# Patient Record
Sex: Male | Born: 1940
Health system: Southern US, Community
[De-identification: ages and names within clinical notes are randomized; demographics above are authoritative.]

## PROBLEM LIST (undated history)

## (undated) DIAGNOSIS — J449 Chronic obstructive pulmonary disease, unspecified: Secondary | ICD-10-CM

## (undated) DIAGNOSIS — F329 Major depressive disorder, single episode, unspecified: Secondary | ICD-10-CM

## (undated) DIAGNOSIS — J984 Other disorders of lung: Secondary | ICD-10-CM

## (undated) DIAGNOSIS — K224 Dyskinesia of esophagus: Secondary | ICD-10-CM

## (undated) DIAGNOSIS — I10 Essential (primary) hypertension: Secondary | ICD-10-CM

## (undated) DIAGNOSIS — E7800 Pure hypercholesterolemia, unspecified: Secondary | ICD-10-CM

## (undated) DIAGNOSIS — J189 Pneumonia, unspecified organism: Secondary | ICD-10-CM

## (undated) DIAGNOSIS — F32A Depression, unspecified: Secondary | ICD-10-CM

## (undated) DIAGNOSIS — K449 Diaphragmatic hernia without obstruction or gangrene: Secondary | ICD-10-CM

## (undated) DIAGNOSIS — M1990 Unspecified osteoarthritis, unspecified site: Secondary | ICD-10-CM

## (undated) DIAGNOSIS — K228 Other specified diseases of esophagus: Secondary | ICD-10-CM

## (undated) HISTORY — PX: ABDOMINAL HERNIA REPAIR: SHX539

## (undated) HISTORY — PX: BACK SURGERY: SHX140

## (undated) HISTORY — PX: JOINT REPLACEMENT: SHX530

---

## 2002-08-20 ENCOUNTER — Emergency Department (HOSPITAL_COMMUNITY): Admission: EM | Admit: 2002-08-20 | Discharge: 2002-08-20 | Payer: Self-pay | Admitting: Emergency Medicine

## 2002-08-21 ENCOUNTER — Emergency Department (HOSPITAL_COMMUNITY): Admission: EM | Admit: 2002-08-21 | Discharge: 2002-08-22 | Payer: Self-pay | Admitting: Emergency Medicine

## 2002-08-22 ENCOUNTER — Emergency Department (HOSPITAL_COMMUNITY): Admission: EM | Admit: 2002-08-22 | Discharge: 2002-08-22 | Payer: Self-pay | Admitting: Emergency Medicine

## 2002-08-22 ENCOUNTER — Encounter: Payer: Self-pay | Admitting: Emergency Medicine

## 2006-05-20 ENCOUNTER — Ambulatory Visit: Admission: RE | Admit: 2006-05-20 | Discharge: 2006-05-20 | Payer: Self-pay | Admitting: Internal Medicine

## 2006-07-13 ENCOUNTER — Ambulatory Visit: Payer: Self-pay | Admitting: Internal Medicine

## 2008-04-24 ENCOUNTER — Encounter: Admission: RE | Admit: 2008-04-24 | Discharge: 2008-04-24 | Payer: Self-pay | Admitting: Internal Medicine

## 2008-08-29 ENCOUNTER — Inpatient Hospital Stay (HOSPITAL_COMMUNITY): Admission: RE | Admit: 2008-08-29 | Discharge: 2008-08-31 | Payer: Self-pay | Admitting: Orthopedic Surgery

## 2009-01-02 ENCOUNTER — Inpatient Hospital Stay (HOSPITAL_COMMUNITY): Admission: RE | Admit: 2009-01-02 | Discharge: 2009-01-04 | Payer: Self-pay | Admitting: Orthopedic Surgery

## 2009-02-17 ENCOUNTER — Emergency Department (HOSPITAL_COMMUNITY): Admission: EM | Admit: 2009-02-17 | Discharge: 2009-02-17 | Payer: Self-pay | Admitting: Emergency Medicine

## 2009-02-24 ENCOUNTER — Emergency Department (HOSPITAL_COMMUNITY): Admission: EM | Admit: 2009-02-24 | Discharge: 2009-02-24 | Payer: Self-pay | Admitting: Emergency Medicine

## 2009-02-27 ENCOUNTER — Inpatient Hospital Stay (HOSPITAL_COMMUNITY): Admission: EM | Admit: 2009-02-27 | Discharge: 2009-03-04 | Payer: Self-pay | Admitting: Orthopedic Surgery

## 2011-01-21 LAB — BASIC METABOLIC PANEL
BUN: 13 mg/dL (ref 6–23)
BUN: 14 mg/dL (ref 6–23)
CO2: 31 mEq/L (ref 19–32)
CO2: 34 mEq/L — ABNORMAL HIGH (ref 19–32)
Calcium: 8.7 mg/dL (ref 8.4–10.5)
Calcium: 9.7 mg/dL (ref 8.4–10.5)
Chloride: 101 mEq/L (ref 96–112)
Chloride: 102 mEq/L (ref 96–112)
Chloride: 99 mEq/L (ref 96–112)
Creatinine, Ser: 0.88 mg/dL (ref 0.4–1.5)
Creatinine, Ser: 0.91 mg/dL (ref 0.4–1.5)
Creatinine, Ser: 0.96 mg/dL (ref 0.4–1.5)
Creatinine, Ser: 1.01 mg/dL (ref 0.4–1.5)
Creatinine, Ser: 1.03 mg/dL (ref 0.4–1.5)
GFR calc Af Amer: >60 mL/min (ref >60)
GFR calc Af Amer: >60 mL/min (ref >60)
GFR calc Af Amer: >60 mL/min (ref >60)
GFR calc non Af Amer: >60 mL/min (ref >60)
GFR calc non Af Amer: >60 mL/min (ref >60)
Glucose, Bld: 120 mg/dL — ABNORMAL HIGH (ref 70–99)
Potassium: 3.4 mEq/L — ABNORMAL LOW (ref 3.5–5.1)
Potassium: 3.6 mEq/L (ref 3.5–5.1)

## 2011-01-21 LAB — CBC
HCT: 30.7 % — ABNORMAL LOW (ref 39.0–52.0)
HCT: 31.9 % — ABNORMAL LOW (ref 39.0–52.0)
HCT: 40.5 % (ref 39.0–52.0)
MCHC: 32.8 g/dL (ref 30.0–36.0)
MCHC: 32.9 g/dL (ref 30.0–36.0)
MCV: 90.8 fL (ref 78.0–100.0)
MCV: 91.3 fL (ref 78.0–100.0)
MCV: 92.6 fL (ref 78.0–100.0)
Platelets: 171 10*3/uL (ref 150–400)
Platelets: 210 10*3/uL (ref 150–400)
RBC: 3.15 MIL/uL — ABNORMAL LOW (ref 4.22–5.81)
RBC: 3.38 MIL/uL — ABNORMAL LOW (ref 4.22–5.81)
RBC: 3.49 MIL/uL — ABNORMAL LOW (ref 4.22–5.81)
RDW: 12.9 % (ref 11.5–15.5)
RDW: 13 % (ref 11.5–15.5)
WBC: 7.5 10*3/uL (ref 4.0–10.5)
WBC: 9 10*3/uL (ref 4.0–10.5)

## 2011-01-21 LAB — TYPE AND SCREEN
ABO/RH(D): O POS
Antibody Screen: NEGATIVE

## 2011-01-21 LAB — URINALYSIS, ROUTINE W REFLEX MICROSCOPIC
Glucose, UA: NEGATIVE mg/dL
Hgb urine dipstick: NEGATIVE
Ketones, ur: NEGATIVE mg/dL
Protein, ur: NEGATIVE mg/dL

## 2011-01-21 LAB — PREPARE RBC (CROSSMATCH)

## 2011-01-23 LAB — CBC
HCT: 31.8 % — ABNORMAL LOW (ref 39.0–52.0)
MCHC: 33.1 g/dL (ref 30.0–36.0)
MCV: 91.2 fL (ref 78.0–100.0)
MCV: 91.7 fL (ref 78.0–100.0)
MCV: 92.5 fL (ref 78.0–100.0)
Platelets: 141 10*3/uL — ABNORMAL LOW (ref 150–400)
Platelets: 146 10*3/uL — ABNORMAL LOW (ref 150–400)
Platelets: 194 10*3/uL (ref 150–400)
RDW: 13.7 % (ref 11.5–15.5)
RDW: 14.1 % (ref 11.5–15.5)
RDW: 14.3 % (ref 11.5–15.5)
WBC: 6.1 10*3/uL (ref 4.0–10.5)

## 2011-01-23 LAB — BASIC METABOLIC PANEL
BUN: 12 mg/dL (ref 6–23)
BUN: 12 mg/dL (ref 6–23)
BUN: 8 mg/dL (ref 6–23)
CO2: 31 mEq/L (ref 19–32)
Calcium: 9.5 mg/dL (ref 8.4–10.5)
Chloride: 106 mEq/L (ref 96–112)
Creatinine, Ser: 1.06 mg/dL (ref 0.4–1.5)
Creatinine, Ser: 1.13 mg/dL (ref 0.4–1.5)
GFR calc Af Amer: >60 mL/min (ref >60)
GFR calc non Af Amer: >60 mL/min (ref >60)
GFR calc non Af Amer: >60 mL/min (ref >60)
Glucose, Bld: 100 mg/dL — ABNORMAL HIGH (ref 70–99)
Glucose, Bld: 115 mg/dL — ABNORMAL HIGH (ref 70–99)
Glucose, Bld: 116 mg/dL — ABNORMAL HIGH (ref 70–99)
Potassium: 4.5 mEq/L (ref 3.5–5.1)
Sodium: 141 mEq/L (ref 135–145)

## 2011-01-23 LAB — APTT: aPTT: 28 seconds (ref 24–37)

## 2011-01-23 LAB — DIFFERENTIAL
Basophils Absolute: 0 10*3/uL (ref 0.0–0.1)
Basophils Relative: 0 % (ref 0–1)
Eosinophils Absolute: 0.2 10*3/uL (ref 0.0–0.7)
Neutro Abs: 2.7 10*3/uL (ref 1.7–7.7)
Neutrophils Relative %: 54 % (ref 43–77)

## 2011-01-23 LAB — URINALYSIS, ROUTINE W REFLEX MICROSCOPIC
Bilirubin Urine: NEGATIVE
Hgb urine dipstick: NEGATIVE
Ketones, ur: NEGATIVE mg/dL
Nitrite: NEGATIVE
Urobilinogen, UA: 0.2 mg/dL (ref 0.0–1.0)

## 2011-01-23 LAB — TYPE AND SCREEN: Antibody Screen: NEGATIVE

## 2011-01-23 LAB — PROTIME-INR: INR: 1 (ref 0.00–1.49)

## 2011-02-25 NOTE — Discharge Summary (Signed)
NAME:  Mark Robertson, Mark Robertson NO.:  [id number]   MEDICAL RECORD NO.:  [id number]          PATIENT TYPE:  INP   LOCATION:  1608                         FACILITY:  [facility]   PHYSICIAN:  Madlyn Frankel. Charlann Boxer, M.D.  DATE OF BIRTH:  August 27, 1941   DATE OF ADMISSION:  08/29/2008  DATE OF DISCHARGE:  08/31/2008                               DISCHARGE SUMMARY   ADMITTING DIAGNOSES:  1. Osteoarthritis.  2. Sleep apnea.  3. Dyslipidemia.   DISCHARGE DIAGNOSES:  1. Osteoarthritis.  2. Sleep apnea.  3. Dyslipidemia.   HISTORY OF PRESENT ILLNESS:  A 71 year old male with a history of right  knee pain secondary to osteoarthritis refractory of all conservative  treatment.   CONSULTATION:  None.   PROCEDURE:  Right total knee replacement by surgeon Dr. Durene Romans.  Assistant, Dwyane Luo PA-C.   LABORATORY DATA:  CBC final reading:  Hemoglobin 10.5, hematocrit 31.6,  platelets 138.  Metabolic panel:  Sodium 143, potassium 4.1, BUN 12,  creatinine 1.09, glucose 105.  UA was negative.  Calcium 8.6.   RADIOLOGY:  Chest two-view no active disease.   CARDIOLOGY:  EKG sinus rhythm.   HOSPITAL COURSE:  The patient underwent right total knee replacement,  admitted to the orthopedic floor.  Tolerated procedure well.  His stay  was unremarkable, made excellent progress.  Physical therapy was able to  do straight leg raise.  On postop day #1, he ambulated 100 feet.  Hemodynamically remained stable.  Dressing was changed on postop day #2,  no significant drainage.  No sign of infection.  He was living alone,  therefore, required short rehab stay to work on further progress so that  he is safe in independent living situation.  As of November 19, he was  stable, afebrile, hemodynamically and orthopedically and was stable for  discharge to rehab.   DISCHARGE DISPOSITION:  Discharged to skilled nursing facility rehab.  Stable and improved condition.   DISCHARGE INSTRUCTIONS:  1. Diet:  Regular  as tolerated by the patient.  2. Discharge wound care:  Keep dry.  May shower.  Cover in plastic.      Redress with dry gauze afterwards.   DISCHARGE MEDICATIONS:  1. Lovenox 40 mg one subcu q. 24 times 10 days.  2. Then start enteric-coated aspirin 325 mg one p.o. daily x4 weeks.  3. Robaxin 500 mg one p.o. q. six muscle spasm pain.  4. Vicodin 7.5/325 one to two p.o. q. 4-6 p.r.n. pain.  5. Iron 325 mg one p.o. t.i.d. x2 weeks.  6. Colace 100 mg one p.o. b.i.d. p.r.n. constipation.  7. MiraLax 17 grams one p.o. daily p.r.n. constipation.  8. Senokot-S one to two p.o. q. b.i.d. t.i.d. p.r.n. constipation.      May give all three simultaneously.  9. Crestor 5 mg p.o. q.h.s.  10.Spiriva 18 mg cap inhaler q.a.m.  11.Vitamin D 1000 units daily.  12.Multivitamin daily.  13.Celebrex 200 mg one p.o. b.i.d. x2 weeks after surgery.   DISCHARGE FOLLOWUP:  Follow up with Dr. Charlann Boxer at phone number [phone] in  2 weeks for wound check.  ______________________________  Yetta Glassman [address], [place]      Madlyn Frankel. Charlann Boxer, M.D.  Electronically Signed    BLM/MEDQ  D:  08/31/2008  T:  08/31/2008  Job:  [id number]   cc:   Soyla Murphy. Renne Crigler, M.D.  Fax: [phone]

## 2011-02-25 NOTE — H&P (Signed)
NAME:  Mark Robertson, CORONADO NO.:  [id number]   MEDICAL RECORD NO.:  [id number]          PATIENT TYPE:  INP   LOCATION:                               FACILITY:  [facility]   PHYSICIAN:  Madlyn Frankel. Charlann Boxer, M.D.  DATE OF BIRTH:  16-Oct-1940   DATE OF ADMISSION:  08/29/2008  DATE OF DISCHARGE:                              HISTORY & PHYSICAL   PROCEDURE:  Right total knee replacement.   CHIEF COMPLAINT:  Right knee pain.   HISTORY OF PRESENT ILLNESS:  A 70 year old male with a history of right  knee pain secondary to osteoarthritis, refractory to all conservative  treatments.  Presurgically assessed prior to surgery.   PRIMARY CARE PHYSICIAN:  Soyla Murphy. Renne Crigler, M.D.   PAST MEDICAL HISTORY:  Significant for:  1. Osteoarthritis.  2. Sleep apnea.  3. Dyslipidemia.   PAST SURGICAL HISTORY:  1. Arthroscopic surgery, right knee in 2008.  2. Hernia surgery, 2003.   FAMILY MEDICAL HISTORY:  Stroke.   SOCIAL HISTORY:  Lives alone.  Will require a rehab stay  postoperatively.   DRUG ALLERGIES:  No known drug allergies.   CURRENT MEDICATIONS:  1. Spiriva 18 mcg inhaler Q. Morning.  2. Crestor 5 mg p.o. nightly.  3. Aspirin 81 mg p.o. daily.  4. Vitamin D 1,000 units p.o. daily.  5. Tylenol Arthritis p.r.n.  6. Celebrex 200 mg one p.o. b.i.d. x2 weeks after surgery.   REVIEW OF SYSTEMS:  GENITOURINARY:  Has increased urination at night.  MUSCULOSKELETAL:  Has back pain and morning stiffness.  Otherwise, see  HPI.   PHYSICAL EXAMINATION:  VITAL SIGNS:  Pulse 72.  Respirations 16.  Blood  pressure 134/80.  Height 5 feet, 5 inches. Weight 204.  GENERAL:  Awake, alert and oriented.  Well-developed, well-nourished  appearing.  NECK:  Supple with no carotid bruits.  CHEST:  Lungs are clear to auscultation bilaterally.  BREASTS:  Deferred.  HEART:  Regular rate and rhythm, S1, S2 distinct.  ABDOMEN:  Soft, nontender, nondistended.  Bowel sounds present.  GENITOURINARY:   Deferred.  EXTREMITIES:  Right knee has varus deformity.  His his right leg is  longer than the left.  SKIN:  No cellulitis.  NEUROLOGICAL:  Intact distal sensibilities.   LABORATORY DATA:  Labs are pending.   EKG:  Normal sinus rhythm.   Chest x-ray:  Last one reviewed on April 09, 2007 which showed no acute  lung disease, borderline cardiomegaly with incidental findings of  degenerative AC joints on the right side.   All others pending.   IMPRESSION:  Right knee osteoarthritis.   PLAN OF ACTION:  Right total knee replacement at [facility] on  August 29, 2008 by surgeon Dr. Durene Romans.  The risks and  complications were discussed.   The patient is planning on a rehab stay postoperatively as he lives  alone.  This should be a short stay.  No medications were provided  today, at the time of history and physical.     ______________________________  Yetta Glassman. [address], [place]      Madlyn Frankel. Charlann Boxer,  M.D.  Electronically Signed    BLM/MEDQ  D:  08/16/2008  T:  08/16/2008  Job:  [id number]   cc:   Soyla Murphy. Renne Crigler, M.D.  Fax: [phone]

## 2011-02-25 NOTE — H&P (Signed)
NAME:  NAVDEEP, HALT NO.:  [id number]   MEDICAL RECORD NO.:  [id number]          PATIENT TYPE:  INP   LOCATION:  1620                         FACILITY:  [facility]   PHYSICIAN:  Alvy Beal, MD    DATE OF BIRTH:  1941-09-11   DATE OF ADMISSION:  02/27/2009  DATE OF DISCHARGE:                              HISTORY & PHYSICAL   REASON FOR ADMISSION:  Acute exacerbation of severe underlying back  pain.   HISTORY:  Peder is a very pleasant 70 year old gentleman who has been  under my care since July of 2009.  He was initially diagnosed with  lumbar spinal stenosis with neurogenic claudication and bilateral knee  arthritis.  He subsequently underwent total knee replacements and was  doing well but now is having significant horrific back, buttock, and  bilateral leg pain.  Because of his previous MRI which demonstrates  spinal stenosis, I elected to proceed with an ESI.  The patient had a  lumbar ESI but did not improve at all.  His pain has gotten  significantly worse.  He was in the emergency room over the weekend and  then called yesterday because of severe horrific back, buttock, and  bilateral leg pain.  Because of the worsening symptoms, he was admitted  to [facility] for acute pain management.   A repeat MRI was done which demonstrated the ongoing severe stenosis at  2-3 and 4-5 both centrally and in lateral recess and neural foramen.  At  this point in time, I discussed with the patient treatment options.  He  stated that he cannot live with this pain; it is severe.  His clinical  exam is consistent with lumbar spinal stenosis with neurogenic  claudication.   PHYSICAL EXAM:  Here at admission, his cranial nerves II-XII were  tested; they are intact.  ABDOMEN:  Soft and nontender.  No shortness of breath or chest pain.  No  history of incontinence of bowel and bladder.  He has diffuse weakness  in the lower extremities but this is due to significant pain.   Negative  straight leg raise test.  Negative Babinski.  No clonus.  Diminished but  symmetrical deep tendon reflexes.  Well-healed bilateral total knee  replacement incisions.  He has significant back pain with attempts at  standing up or extension.  Relief with forward flexion.  No cervical or  thoracic tenderness on exam.   At this point in time, his repeat MRI was reviewed and compared to his  2009 MRI which demonstrated ongoing significant stenosis at 2-3, 4-5  with foraminal stenosis, as well as lateral recess stenosis.   At this point in time, the patient has horrific spinal stenosis with  neurogenic claudication which has failed conservative management.  I  discussed the risks, benefits, and alternatives to surgery.  Risks  include infection, bleeding, nerve damage, death, stroke, paralysis,  failure to heal, need for further surgery, ongoing or worse pain, loss  of bowel and bladder control, need for further surgery.  After  discussing this with the patient, he elected to proceed  with  surgery.  We will go ahead and plan on doing surgery this admission  since he is having horrific pain.  I will have anesthesia see him today  for his preop evaluation and we will plan on doing surgery tomorrow if  he is cleared.  Please refer to the written H and P for further details.      Alvy Beal, MD  Electronically Signed     DDB/MEDQ  D:  02/28/2009  T:  02/28/2009  Job:  [id number]

## 2011-02-25 NOTE — Op Note (Signed)
NAME:  Mark Robertson, Mark Robertson NO.:  [id number]   MEDICAL RECORD NO.:  [id number]          PATIENT TYPE:  INP   LOCATION:  0003                         FACILITY:  [facility]   PHYSICIAN:  Madlyn Frankel. Charlann Boxer, M.D.  DATE OF BIRTH:  May 18, 1941   DATE OF PROCEDURE:  01/02/2009  DATE OF DISCHARGE:                               OPERATIVE REPORT   PREOPERATIVE DIAGNOSIS:  1. Left knee osteoarthritis.  2. History of right total knee replacement.   POSTOPERATIVE DIAGNOSIS:  1. Left knee osteoarthritis.  2. History of right total knee replacement.   PROCEDURE:  Left total knee replacement.   COMPONENTS USED:  DePuy rotating platform posterior stabilized knee  system, size 4 femur, 4 tibia, 15 mm insert and a 41 patellar button.   SURGEON:  Madlyn Frankel. Charlann Boxer, M.D.   ASSISTANT:  Dwyane Luo, PA-C.   ANESTHESIA:  General.   BLOOD LOSS:  Minimal.   TOURNIQUET TIME:  40 minutes at 250 mmHg.   DRAINS:  One Hemovac.   COMPLICATIONS:  None.   SPECIMEN:  None.   FINDINGS:  None.   INDICATIONS:  [name] is a 70 year old gentleman patient of mine for  bilateral knee osteoarthritis with a history of right total knee  replacement approximately 4 months ago.  He had done very well.  He is  here to proceed with left total knee replacement.  Risks and benefits of  procedure were discussed and reviewed.  Questions were encouraged and  reviewed at the time of history and physical.   PROCEDURE IN DETAIL:  The patient was brought to operative theater.  Once adequate anesthesia, preoperative antibiotics, Ancef, administered,  the patient was positioned supine.  Left thigh tourniquet placed.  Left  lower extremity prepped and draped in sterile fashion following  prescrub.  Time-out was performed identifying the patient, planned  procedure and extremity.   The leg was exsanguinated, tourniquet elevated to 250 mmHg.  A midline  incision was then made, followed by median arthrotomy.   Following  initial debridement, attention was directed to the patella.  Precut  measure was 26 mm.  I resected down to 14 to 15 mm and used a 41  patellar button.  Again we drilled the lug holes and placed a metal shim  into the cut surface to protect it from retractors and saw blades.   Attention was now directed to the femur.  Femoral canal was opened with  a drill.  I did review some osteophytes with the notch.  Identified  landmarks.  The canal was irrigated to prevent fat emboli.  Intramedullary rod was passed as I had done on the right knee, I  resected 11 mm of bone off the distal femur at 5 degrees of valgus.  This was due to 5+ degree flexion contracture.   The tibia was then subluxated anteriorly and using extramedullary guide,  I resected 10 mm bone off the proximal lateral tibia.  This appeared to  be a little thicker than bone up to 4 mm medially.  Nonetheless I did  this as a  measured resection.  I checked this with a tibial tray to make sure the cut was perpendicular  as my rotation was going to be set off the proximal cut.  I ended up  removing a little bit more bone on the lateral side of the proximal  tibia to make it more of a perpendicular cut to keep it out of varus.  Once I rechecked this, I was satisfied it was perpendicular in both  planes.   At this point we sized the femur to be a size 4, set the rotation of the  femoral component based off the proximal tibia cut using the C clamp.  The rotation block was pinned in position and based off the anterior  referencing.   It was exchanged out for a 4-in-1 cutting block for the size 4 femur.  The anterior-posterior chamfer cuts were then all made without  difficulty nor notching.  Box cut was made off the lateral aspect of  distal femur.   Attention was redirected back to the tibia and the tibia cut surface and  the best fit with a size 4 that was on the right knee.  I removed medial  osteophytes off the  distal femur as well as the proximal medial tibia to  help decrease the tension on the medial collateral ligament.  The size 4  tray was pinned into position, drilled and keel punched.  Trial  reduction was carried out.  At this point I placed a 12.5 insert and  felt the knee had just a little hyperextension and thus was going with  the 12.5 and 15 insert.   At this point the patella tracked without application of any pressure.     At this point all trial components removed.  Final components were  opened on the back table holding the polyethylene insert.  The knee was  irrigated with normal saline solution and pulse lavage.  The synovial  lining was injected with 60 mL of 0.25% Marcaine with epinephrine and 1  mL of Toradol.   Cement was mixed.  The cut surfaces were dried.  The components were  cemented in position with a knee brought to full extension with a 12.5  insert.  Extruded cement was removed.  Once cement cured, excessive  cement removed.  Based on the little bit of residual hyperextension.  I  ended up choosing the 15 final insert.  Once cement cured and excessive  cement was removed throughout the knee, the final 15 insert to match the  size 4 femur was inserted.  The knee was reirrigated with normal saline  solution.  Medium Hemovac drain was placed deep.  At this point the  extensor mechanism was closed over the drain with #1 Vicryl with the  knee in flexion.  The remainder of the wound  was closed with 2-0 Vicryl and running 4-0 Monocryl on the skin.  Skin  was cleaned and dried and Steri-Strips and bulky sterile wrap.  He was  then brought to the recovery room, extubated, in stable condition,  tolerating procedure well.      Madlyn Frankel Charlann Boxer, M.D.  Electronically Signed     MDO/MEDQ  D:  01/02/2009  T:  01/02/2009  Job:  [id number]

## 2011-02-25 NOTE — H&P (Signed)
NAME:  Mark Robertson, Mark Robertson NO.:  [id number]   MEDICAL RECORD NO.:  [id number]          PATIENT TYPE:  INP   LOCATION:  NA                           FACILITY:  [facility]   PHYSICIAN:  Madlyn Frankel. Charlann Boxer, M.D.  DATE OF BIRTH:  [birth year]   DATE OF ADMISSION:  DATE OF DISCHARGE:                              HISTORY & PHYSICAL   PROCEDURE:  Left total knee replacement.   CHIEF COMPLAINT:  Left knee pain.   HISTORY OF PRESENT ILLNESS:  A 70 year old male with a history of left  knee pain secondary to osteoarthritis that has been refractory to all  conservative treatment.  He does have a recent history of right total  knee replacement in November 2009 and has done very well with this  procedure.  As far as his left knee, he has been presurgically assessed  for left total knee replacement.   PRIMARY CARE PHYSICIAN:  Dr. Merri Brunette.   PAST MEDICAL HISTORY:  1. Osteoarthritis.  2. Sleep apnea.  3. Dyslipidemia.   PAST SURGICAL HISTORY:  1. Right total knee replacement November 2009 by Dr. Charlann Boxer.  2. Arthroscopic surgery of right knee in 2008.  3. Hernia surgery repair in 2003.   SOCIAL HISTORY:  Lives alone.  Nonsmoker currently.  Primary care will  be friend in home postoperatively.   FAMILY HISTORY:  Stroke.   MEDICATION ALLERGIES:  No known drug allergies.   CURRENT MEDICATIONS:  1. Spiriva 18 mcg inhaler morning.  2. Celebrex 200 mg 1 p.o. daily at time of surgery.  3. We will increased to 1 p.o. b.i.d. for 2 weeks after surgery.  4. Aspirin 81 mg daily.  5. Multivitamin daily.  6. Norco 5/325 one to two p.o. q.4-6 h. p.r.n. pain, up to 8 tablets a      day.   REVIEW OF SYSTEMS:  GENITOURINARY:  Has increased urination at night.  MUSCULOSKELETAL:  He has back pain and some symptoms down his left leg.  Otherwise see HPI.   PHYSICAL EXAMINATION:  Pulse 72, respirations 16, blood pressure 148/84.  GENERAL:  Awake, alert and oriented.  HEENT:  Normocephalic.  NECK:  Supple.  No carotid bruits.  CHEST:  Clear to auscultation bilaterally.  BREASTS:  Deferred.  HEART:  S1-S2 distinct.  ABDOMEN:  Soft, bowel sounds present.  PELVIS:  Stable.  GENITOURINARY:  Deferred.  EXTREMITIES:  Right knee full extension 120 degrees.  Left knee has  significant varus deformity.  SKIN:  No cellulitis.  NEUROLOGIC:  Intact distal sensibilities.   LABORATORY DATA:  Labs, EKG, chest x-ray pending.   EKG does have a report showing normal sinus rhythm.   IMPRESSION:  Left knee osteoarthritis.   PLAN OF ACTION:  Left total knee replacement by Dr. Charlann Boxer at [facility] on January 02, 2009.  Risks and complications were discussed.   Postoperative medications were provided including aspirin for DVT  prophylaxis.     ______________________________  Yetta Glassman [address], [place]      Madlyn Frankel. Charlann Boxer, M.D.  Electronically Signed    BLM/MEDQ  D:  12/22/2008  T:  12/22/2008  Job:  [id number]   cc:   Soyla Murphy. Renne Crigler, M.D.  Fax: [phone]

## 2011-02-25 NOTE — Op Note (Signed)
NAME:  Mark Robertson, Mark Robertson NO.:  [id number]   MEDICAL RECORD NO.:  [id number]          PATIENT TYPE:  INP   LOCATION:  1620                         FACILITY:  [facility]   PHYSICIAN:  Alvy Beal, MD    DATE OF BIRTH:  [birth year]   DATE OF PROCEDURE:  03/01/2009  DATE OF DISCHARGE:                               OPERATIVE REPORT   PREOPERATIVE DIAGNOSIS:  Severe lumbar spinal stenosis L2-3, L3-4, L4-5.   POSTOPERATIVE DIAGNOSIS:  Severe lumbar spinal stenosis L2-3, L3-4, L4-  5.   PROCEDURE:  1. Posterior lumbar decompression L2 to L5 with bilateral L2-3, L3-4,      L4-5, L5-S1 foraminotomies.  2. Posterior arthrodesis L3-L5.   COMPLICATIONS:  None.   CONDITION:  Stable.   FIRST ASSISTANT:  Crissie Reese, PA   This is a very pleasant 70 year old man who presented to my care  approximately one year ago.  He has subsequently been diagnosed with  lumbar spinal stenosis.  He was initially treated with bilateral total  knee replacements for severe degenerative arthritis of the knee.  He  continued to have increasing horrific back pain, buttock pain and  neurogenic symptoms and bilateral leg pain.  Because of the progression  in the neurologic claudication and severe back pain, the patient  returned to see me.  Over the past month, his pain has gotten  significantly worse to the point where he was seen in the emergency room  and then ultimately admitted for pain control.  After an epidural  injection failed to provide any relief, he elected to proceed with  surgery.  All appropriate risks, benefits and alternatives to surgery  were discussed and consent was obtained.   OPERATIVE NOTE:  The patient was brought to the operating room and  placed supine on the operating table.  After successful induction of  general anesthesia and endotracheal intubation, TEDs, SCDs were applied  and the patient was turned prone onto a Wilson frame.  All bony  prominences well  padded and the back was prepped and draped in a  standard fashion.   After an appropriate preoperative time out confirming patient and  procedure, I then proceeded with the incision.  A generous posterior  midline incision was made starting at about the superior aspect of L2  and proceeding down to the inferior aspect of L5.  Sharp dissection was  carried out through the deep subcutaneous tissue to the deep fascia.  Deep fascia was sharply incised and then I began using a Cobb elevator  and electrocautery to mobilize the paraspinal muscles subperiosteally to  expose the spinous process and lamina of L2, L3, L4 and a portion of  that of L5.  This was done bilaterally.  I then dissected out to the  facet complexes.  Care was taken not to violate them.  At this point in  time I took an intraoperative x-ray to confirm the L2 lamina.  Once I  confirmed the L2 lamina, I then proceeded with the decompression.   Using a double-action Leksell rongeur, I removed the entire spinous  process of  L4 and L3 and majority of that of L2.  I then used my double-  action Leksell rongeur to remove a good portion of the lamina of L3 and  L4 as well.  Once I had done as much of the bony decompression as I  could with the double-action Leksell rongeur, I used a fine curette to  develop a plane underneath the ligamentum flavum.  I then exploited this  surgical plane with a 3-mm Kerrison to resect the complete my central  decompression.  Once I had completed the laminectomy of L4, I then  proceeded superiorly, removing the ligamentum flavum and resected the  remaining portion of the L3 lamina.  I then continued to proceed  superiorly and performed a partial L2 laminotomy centrally.  This  allowed me to have an adequate central decompression from L2 down to L5.  I did resect the superior portion of the L5 lamina as well.  Once I had  the central decompression complete, I then proceeded to lateral gutter.  At the  L2-3 space I then used a Penfield 4 to develop a plane between  the thecal sac and the ligamentum flavum and bony spurs and lateral  recess.  Using a 2 and 3-mm Kerrison.  I resected all the bone until I  could palpate and visualize the medial wall of the pedicle.  I then  proceeded into the L2 neural foramen and decompressed this.  I then  proceeded continued inferiorly resecting the hypertrophic facet, spurs  and ligamentum flavum in the lateral recess and again performed an L3  foraminotomy.  I then proceeded all the way down to the 4-5 foramen and  I resected this as well.  And performed a foraminotomy there.  Once I  completed this, I was able to easily now palpate with a dural elevator  up past the L2 pedicle out the L2 neural foramen, 3 neural foramen and  the L4 neural foramen.  There I was also able to palpate down into  towards the L5 lateral recess all indicating that there was no  significant undue pressure and that I had adequately decompressed  centrally and in the left lateral gutter.   With the left side decompressed, I then went to the contralateral side  and then proceeded to do the same decompression in the right lateral  recess that had been on the left.   Once I had obtained bilateral decompression, I was concerned because  pars was thinned out and had significant facet disease.  As such, I  elected to proceed with arthrodesis.  I because the L2 pars was still  intact I __________  using the L3, 4, and 5 transverse processes.  This  provided me with enough stability.  I extended my cervical approach out  over the facet complex to expose the L3, L4 and L5 transverse process.  Using a high speed bur I decorticated and using the bone that I had  harvested mixed with Actifuse, I packed it in the posterior lateral  gutter bilaterally.  I copiously irrigated the wound with saline.  At  this point time having completed the decompression and confirmed  adequate  decompression bilaterally with palpation with the dural  elevator in the lateral recess superiorly, inferiorly and medially and  having properly placed my bone,  I then placed deep drain and closed the  deep fascia with interrupted #1 Vicryl sutures, superficial with 2-0  Vicryl sutures and 3-0 Monocryl for the skin.  Steri-Strips,  dry  dressing were applied.  The patient was extubated, transferred to PACU  without incident.  At the end of the case all needle and sponge counts  were correct.      Alvy Beal, MD  Electronically Signed     DDB/MEDQ  D:  03/01/2009  T:  03/02/2009  Job:  [id number]

## 2011-02-25 NOTE — Discharge Summary (Signed)
NAME:  Mark Robertson, Mark Robertson NO.:  [id number]   MEDICAL RECORD NO.:  [id number]          PATIENT TYPE:  INP   LOCATION:  1611                         FACILITY:  [facility]   PHYSICIAN:  Madlyn Frankel. Charlann Boxer, M.D.  DATE OF BIRTH:  03-12-1941   DATE OF ADMISSION:  01/02/2009  DATE OF DISCHARGE:  01/04/2009                               DISCHARGE SUMMARY   ADMITTING DIAGNOSES:  1. Osteoarthritis.  2. Sleep apnea.  3. Dyslipidemia.   DISCHARGE DIAGNOSES:  1. Osteoarthritis.  2. Sleep apnea.  3. Dyslipidemia.   HISTORY OF PRESENT ILLNESS:  A 70 year old male with a history of left  knee pain secondary to osteoarthritis refractory to all conservative  treatment.   CONSULTS:  None.   PROCEDURE:  Left total knee replacement by surgeon, Dr. Durene Romans.  Assistant, Dwyane Luo, PAC.   LABORATORY DATA:  CBC final reading white blood cell 6.1, hemoglobin  10.5, hematocrit 31.9, platelets 141.  Metabolic panel - sodium 137,  potassium 4.4, BUN 8, creatinine 1.06, glucose 116.   Chest two view, no active disease.   Cardiology, EKG normal sinus rhythm.   HOSPITAL COURSE:  The patient was admitted to the hospital and underwent  a left total knee replacement and admitted to the orthopedic floor.  He  remained hemodynamically orthopedically stable throughout his course of  stay.  Dressing was changed.  No significant drainage from the wound.  No sign of infection.  He had improving quad function and was  weightbearing as tolerated and by day 2 had met all criteria for  discharge home in stable improved condition.   DISCHARGE DISPOSITION:  Discharged home in stable improved condition.   DISCHARGE PHYSICAL THERAPY:  Weightbearing as tolerated with the use of  rolling walker with home health care.   DISCHARGE DIET:  Heart healthy.   DISCHARGE WOUND CARE:  Keep dry.   DISCHARGE MEDICATIONS:  1. Aspirin 325 mg p.o. b.i.d.  2. Robaxin 500 mg one p.o. q.6 hours p.r.n. muscle spasm  and pain.  3. Iron 325 mg one p.o. t.i.d.  4. Colace 100 mg p.o. b.i.d.  5. MiraLax 17 g p.o. daily.  6. Norco 7.5/325 one to two p.o. q.4-6 hours p.r.n. pain.  7. Crestor 10 mg half tablet at bedtime.  8. Celebrex 200 mg one p.o. b.i.d. x2 weeks after surgery.  9. Spiriva 18 mcg in hand inhaler one inhalation in the morning.  10.Centrum Silver multivitamin daily.  11.Vitamin D 1000 units daily.  12.Senokot S p.r.n.   DISCHARGE FOLLOWUP:  Follow up with Dr. Charlann Boxer at phone number [phone] in  2 weeks for wound check.     ______________________________  Yetta Glassman. [address], [place]      Madlyn Frankel. Charlann Boxer, M.D.  Electronically Signed    BLM/MEDQ  D:  01/04/2009  T:  01/04/2009  Job:  [id number]   cc:   Soyla Murphy. Renne Crigler, M.D.  Fax: [phone]

## 2011-02-25 NOTE — Op Note (Signed)
NAME:  Mark Robertson, Mark Robertson NO.:  [id number]   MEDICAL RECORD NO.:  [id number]          PATIENT TYPE:  INP   LOCATION:  0010                         FACILITY:  [facility]   PHYSICIAN:  Madlyn Frankel. Charlann Boxer, M.D.  DATE OF BIRTH:  [birth year]   DATE OF PROCEDURE:  08/29/2008  DATE OF DISCHARGE:                               OPERATIVE REPORT   PREOPERATIVE DIAGNOSIS:  Right knee osteoarthritis.   POSTOPERATIVE DIAGNOSIS:  Right knee osteoarthritis.   PROCEDURE:  Right total knee replacement.   COMPONENTS USED:  DePuy rotating platform posterior stabilized knee  system, size 4 femur, 4 tibia, 12.5 insert and a 41 patella button.   SURGEON:  Madlyn Frankel. Charlann Boxer, M.D.   ASSISTANT:  Dwyane Luo, PA-C   ANESTHESIA:  General.   BLOOD LOSS:  Minimal.   TOURNIQUET TIME:  45 minutes at 250 mmHg.   DRAINS:  One Hemovac.   SPECIMENS:  None.   COMPLICATIONS:  None.   INDICATIONS FOR PROCEDURE:  Mark Robertson is a 70 year old male who had  been followed for bilateral knee osteoarthritis.  He had come to the  point where he had failed conservative measures, had decreased function  and quality life.  He at this point wished to proceed with arthroplasty.  We reviewed the risks of infection, DVT, component failure, need for  revision for any reason, postoperative course expectations.  Consent was  obtained for the benefit of pain relief.   PROCEDURE IN DETAIL:  The patient was brought to the operative theater.  Once adequate anesthesia, preoperative antibiotics, Ancef 2 grams  administered, the patient was positioned supine with a right thigh  tourniquet placed.  The right lower extremity was prescrubbed and  prepped and draped in sterile fashion.  Following the time-out,  identifying the patient and extremity, the right lower extremity was  exsanguinated, tourniquet elevated.   A midline incision was made, followed by median arthrotomy.  Following  initial debridement, attention was first  directed to the patella.  Precut measurement was 25 mm.  I resected down to 14 mm and used a 41  patella button.  A metal shim was placed on the cut surface to protect  it from saw blades and retractors.   At this point, the attention was directed to the femur.  The femoral  canal was opened with a drill, irrigated to prevent fat emboli.  I then  placed an intramedullary rod in at 5 degrees valgus, resected 11 mm of  bone off the distal femur due to preoperative flexion contracture.   Following this distal femoral cut, I attended to the tibia.  The tibia  was subluxated out anteriorly.  Using the extramedullary guide, I  resected 10 mm of bone based off the lateral proximal tibia.  I checked  at this point with extension block and was happy the knee was going to  come out to extension.  The pins were removed.  We checked the cut  surface of the tibia at this point with a size 4 tray and found it was a  perpendicular cut in both planes.  Given this, the rotation block was  pinned into place on the distal femur, based on the proximal tibial cut.  Once the smooth pins were in place and sized appropriately for a size 4,  the 4 in 1 cutting block was placed over these pins.   The anterior-posterior and chamfer cuts were then made without  difficulty nor notching.   The final box cut was then made into the distal femur, based on the  lateral aspect of distal femur.   At this point, attention was redirected back to the tibia with the tibia  subluxated anteriorly.  Tibial tray was pinned into position through the  medial third of the tubercle.  It was drilled and keel punched and a  trial reduction now carried out.   Trial 4 femur, 12.5 insert and a 4 tibia were  utilized.  The knee came  out to full extension with stable ligaments in extension and flexion.  The patella was noted to track through the trochlea without application  of pressure.  Given these findings, the trial components  were removed.  The final components were opened on the back field.  The synovial  capsule junction was injected with 0.25% Marcaine with epinephrine and 1  cc of Toradol.  The knee was irrigated with normal saline solution pulse  lavage.  Once the knee was dried and prepared, the final components were  cemented into position.  The knee was brought to extension with a 12.5  insert in place.  Extruded cement was removed.  Once the cement had  cured, excessive cement was removed throughout the knee.  I made sure  there was no remaining cement posteriorly.  The final 12.5 insert, to  match the size 4 femur, was then inserted.  There appeared be good  balance in flexion with the patient particularly medially.   At this point, the knee was reirrigated with normal saline solution.  A  medium Hemovac drain was placed in the deep tissues.  I reapproximated  the extensor mechanism with the knee in flexion, using #1Vicryl.  The  remainder of the wound was closed with 2-0 Vicryl and running 4-0  Monocryl.  The knee was cleaned, dried and dressed sterilely with Steri-  Strips and a bulky sterile wrap.  He tolerated the procedure well.      Madlyn Frankel Charlann Boxer, M.D.  Electronically Signed     MDO/MEDQ  D:  08/29/2008  T:  08/30/2008  Job:  [id number]

## 2011-02-28 NOTE — Discharge Summary (Signed)
NAME:  Mark Robertson, Mark Robertson NO.:  [id number]   MEDICAL RECORD NO.:  [id number]          PATIENT TYPE:  INP   LOCATION:  1620                         [facility]ACILITY:  [facility]   PHYSICIAN:  Alvy Beal, MD    DATE OF BIRTH:  [birth year]   DATE OF ADMISSION:  02/27/2009  DATE OF DISCHARGE:  03/04/2009                               DISCHARGE SUMMARY   ADMISSION DIAGNOSIS:  A severe lumbar spinal stenosis L2-3, L3-4, L4-5  and acute exacerbation of severe underlying back pain.   DISCHARGE DIAGNOSIS:  A severe lumbar spinal stenosis L2-3, L3-4, L4-5  and acute exacerbation of severe underlying back pain.   PROCEDURE:  1. On Mar 01, 2009 a posterior lumbar decompression L2-L5 with      bilateral L2-3, L3-4, L4-5 and L5-S1 foraminotomies.  2. Posterior arthrodesis on Mar 01, 2009.   CONSULTATIONS:  None.   BRIEF HISTORY:  Mark Robertson is a very pleasant 70 year old gentleman who  has been under the care of Dr. Shon Baton since April 16, 2008.  He was  initially diagnosed with severe lumbar spinal stenosis with neurogenic  claudication and bilateral knee arthritis.  He subsequently underwent  bilateral total knee replacements and was doing well.  However, has now  had a more recent onset of this is a significant horrific buttock and  bilateral leg pain.  Because of his prior MRI which demonstrated his  spinal stenosis, Dr. Shon Baton elected to start was lumbar epidural steroid  injection.  The patient recently did have this lumbar epidural steroid  injection.  However, did not improve at all.  His pain was significantly  worse and he was in the emergency room approximately 2 days prior to his  admission because of his severe horrific low back pain.  Because of his  worsening symptoms, he was direct admitted to [facility] for  acute pain management.  A repeat MRI which is done in the hospital  demonstrated the ongoing severe stenosis at L2-3 and L4-5, both  centrally and in the  lateral recess and/or neural foramen.  Dr. Shon Baton  did discuss with the patient treatment options and the patient stated  that because his pain was severe, he could not live with it; therefore  he elected to proceed with surgery.   HOSPITAL COURSE:  The patient's hospital course was approximately 5 days  in length.  On admission day 2, the patient did undergo the  aforementioned procedure.  He tolerated this very well and was returned  to the orthopedic floor without incident.  Postoperative day #1, he was  stable.  He had no complaints.  His pain was well-controlled with oral  medications as compartments were soft.  He had no shortness of breath  and/or chest pain.  He is neurovascularly intact.  His leg pain had  improved and his drain was pulled at that point.  Postoperative day #2,  the patient continued to work very well with physical therapy.  He was  ambulating with a rolling walker and continued to make positive  improvement.  Therefore, on postoperative day #3, which  is approximately  hospital day #5, he was afebrile and vital signs were stable.  Neurovascularly he was intact.  His incision was clean, dry and intact.  Compartments were soft.  He was tolerating a regular diet.  He was  voiding on his own and having regular bowel movements.  Therefore, he  was deemed stable to be discharged home with [facility].  The  patient's labs upon discharge included a WBC of 7.3, RBC of 3.38,  hemoglobin of 10.1, hematocrit 30.7.  Sodium of 139, potassium of 3.4,  chloride of 102, bicarb of 34, glucose of 117, a BUN of 6 and a  creatinine of 0.88.  The patient's vitals upon discharge included a  temperature of 98.4, pulse of 87 and blood pressure of 136/81.  The  patient is then discharged to home with home health care in stable  condition.   The patient is instructed to follow up with Dr. Shon Baton in 2 weeks from  his discharge date for removal of sutures and a wound check.    DISCHARGE INSTRUCTIONS:  The patient's discharge instructions include  that he is to walk as much as tolerated with back precautions, no  bending, stooping, twisting, and/or squatting, nothing heavier than 6  pounds which is approximately one gallon of milk.  He is to call our  office at [phone] again to schedule his followup appointment.  He is  also to call our office at of [phone] if he has any increased fevers  and chills, nausea or vomiting, increased pain, loss of bowel or bladder  function or changes in his gait pattern or again horrific low back pain.  The patient is also instructed to change his dressing daily for 7 days.      Crissie Reese, PA      Alvy Beal, MD  Electronically Signed    AC/MEDQ  D:  04/29/2009  T:  04/29/2009  Job:  [id number]

## 2011-02-28 NOTE — Assessment & Plan Note (Signed)
[facility]                               PULMONARY OFFICE NOTE   NAME:BERRIERKein, Mark                      MRN:          [id number]  DATE:07/13/2006                            DOB:          1941/06/10    REFERRING PHYSICIAN:  Soyla Murphy. Renne Crigler, M.D.   HISTORY:  A 70 year old white male quit smoking three years ago, limited he  says more by his knees than his breathing and underwent a recent overnight  sleep oximetry that showed prolonged intermittent desaturation but at the  time declined the opportunity to be placed on nocturnal oxygen.  He also has  mild obstructive sleep apnea per sleep study in Dr. Carolee Rota chart with RDI  of only 12.2.  There is no history of any polycythemia, morning headache or  hypersomnolence, chest pain, fever, chills, sweats or excessive morning  sputum production.   PAST MEDICAL HISTORY:  Significant for moderate obesity which worsened after  he stopped smoking, hyperlipidemia and mild sleep apnea as noted.   ALLERGIES:  SOME KIND OF SKIN OINTMENT.   MEDICATIONS:  Crestor, aspirin, Spiriva, Centrum Silver and Osteo-Biflex (he  denies any improvement on Spiriva but had no limiting symptoms prior to  starting the Spiriva).   SOCIAL HISTORY:  He quit smoking three years ago.  He is retired. No unusual  occupational, travel, pet or hobby exposure.   FAMILY HISTORY:  Recorded in detail on my work sheet and reviewed with the  patient.  Significant for the absence of respiratory diseases or atopy.   REVIEW OF SYSTEMS:  Taken in detail also on the work sheet.  Significant for  the problems outlined above only except for occasional reflux.   PHYSICAL EXAMINATION:  GENERAL:  Obese white male in no acute distress, who  has a very minimalistic attitude regarding medical therapy.  VITAL SIGNS:  He is afebrile, normal vital signs.  HEENT:  Unremarkable.  Oropharynx clear.  LUNGS:  Lung fields reveal minimal rhonchi on FVC  maneuver.  HEART:  Regular rhythm, without murmur, gallop or rub.  ABDOMEN:  Soft, obese but benign, Hoover sign was positive __________  inspiration.  EXTREMITIES: Warm without calf tenderness, cyanosis, clubbing or edema.   Overnight oximetry was reviewed from Dr. Carolee Rota notes and indicates that  the patient had documented 74 minutes of oxygen saturations less than 88.  No recent chest x-rays were available.  The most recent x-ray on file was  from November 21, 2003 and revealed mild cardiomegaly, no acute infiltrates  or effusions.  PFTs revealed moderate air flow obstruction with no change in  diffusion capacity.  Minimal response to bronchodilators.   IMPRESSION:  This patient had moderate chronic obstructive pulmonary disease  with minimal but no obvious asthmatic component.  That is, he denies any  excessive morning sputum production, winter, summer or otherwise, no  tendency to exacerbation.  I believe Spiriva is the best choice for him.  Since the pulse oximetry was done, however, after Spiriva was started, it is  unlikely that any further COPD tune up is going to improve his overnight  oximetry.   On the other hand, the patient denies any morning headache, excessive  hypersomnolence and there is no history of polycythemia on the records I  have reviewed today.  If so, a strong argument could be made to place this  patient at least on nocturnal oxygen, which he certainly qualifies for.  My  take on this patient is that he has a minimalistic affect and attitude with  very poor insight into any of his medical conditions and is not likely to  comply with nocturnal oxygen.  On the other hand, he is minimally  symptomatic and has no secondary complications of nocturnal hypoxemia.   I did recommend to the patient however, consideration for oxygen at 2 L at  bedtime and he said he would think about it but can get this through Dr.  Carolee Rota office, where the original overnight  oximetry was performed.   I did point out to the patient also that reduction of his weight may help  nocturnal oxygenation because of poor VQ mismatching in the bases and also  applauded him on his ability to maintain smoking abstinence, which I believe  is going to make much more difference than anything I have to offer him here  at the pulmonary clinic, in terms of his long term lung health.  Regular  pulmonary follow-up, however, is not needed.            ______________________________  Charlaine Dalton Sherene Sires, MD, [facility]      MBW/MedQ  DD:  07/13/2006  DT:  07/14/2006  Job #:  [id number]   cc:   Soyla Murphy. Renne Crigler, M.D.

## 2011-07-15 LAB — BASIC METABOLIC PANEL
CO2: 30
CO2: 32
Calcium: 8.1 — ABNORMAL LOW
Calcium: 8.6
Chloride: 103
Chloride: 105
Creatinine, Ser: 1.09
Creatinine, Ser: 1.11
Creatinine, Ser: 1.18
GFR calc Af Amer: >60
GFR calc Af Amer: >60
GFR calc Af Amer: >60
GFR calc non Af Amer: >60
GFR calc non Af Amer: >60
Glucose, Bld: 105 — ABNORMAL HIGH
Glucose, Bld: 122 — ABNORMAL HIGH
Potassium: 3.8
Sodium: 143

## 2011-07-15 LAB — DIFFERENTIAL
Eosinophils Absolute: 0.1
Lymphocytes Relative: 29
Lymphs Abs: 1.5
Monocytes Relative: 10
Neutro Abs: 3
Neutrophils Relative %: 58

## 2011-07-15 LAB — CBC
MCHC: 33.3
MCHC: 34
MCV: 92.3
MCV: 92.8
Platelets: 199
RBC: 3.37 — ABNORMAL LOW
RBC: 3.38 — ABNORMAL LOW
RBC: 4.87
RDW: 13.3
RDW: 13.4
WBC: 5.1

## 2011-07-15 LAB — URINALYSIS, ROUTINE W REFLEX MICROSCOPIC
Nitrite: NEGATIVE
Protein, ur: NEGATIVE
Specific Gravity, Urine: 1.018
Urobilinogen, UA: 0.2

## 2011-07-15 LAB — APTT: aPTT: 23 — ABNORMAL LOW

## 2011-07-15 LAB — TYPE AND SCREEN: ABO/RH(D): O POS

## 2011-07-15 LAB — ABO/RH: ABO/RH(D): O POS

## 2011-07-15 LAB — PROTIME-INR
INR: 0.9
Prothrombin Time: 12.2

## 2011-10-16 ENCOUNTER — Emergency Department (HOSPITAL_COMMUNITY): Payer: Medicare Other

## 2011-10-16 ENCOUNTER — Other Ambulatory Visit: Payer: Self-pay

## 2011-10-16 ENCOUNTER — Emergency Department (HOSPITAL_COMMUNITY)
Admission: EM | Admit: 2011-10-16 | Discharge: 2011-10-16 | Disposition: A | Discharge status: Home / Self Care | Payer: Medicare Other | Attending: Emergency Medicine | Admitting: Emergency Medicine

## 2011-10-16 ENCOUNTER — Encounter: Payer: Self-pay | Admitting: *Deleted

## 2011-10-16 DIAGNOSIS — I10 Essential (primary) hypertension: Secondary | ICD-10-CM | POA: Insufficient documentation

## 2011-10-16 DIAGNOSIS — R682 Dry mouth, unspecified: Secondary | ICD-10-CM

## 2011-10-16 DIAGNOSIS — K117 Disturbances of salivary secretion: Secondary | ICD-10-CM | POA: Insufficient documentation

## 2011-10-16 DIAGNOSIS — Z79899 Other long term (current) drug therapy: Secondary | ICD-10-CM | POA: Insufficient documentation

## 2011-10-16 DIAGNOSIS — E7800 Pure hypercholesterolemia, unspecified: Secondary | ICD-10-CM | POA: Insufficient documentation

## 2011-10-16 DIAGNOSIS — F329 Major depressive disorder, single episode, unspecified: Secondary | ICD-10-CM | POA: Insufficient documentation

## 2011-10-16 DIAGNOSIS — F3289 Other specified depressive episodes: Secondary | ICD-10-CM | POA: Insufficient documentation

## 2011-10-16 HISTORY — DX: Major depressive disorder, single episode, unspecified: F32.9

## 2011-10-16 HISTORY — DX: Pure hypercholesterolemia, unspecified: E78.00

## 2011-10-16 HISTORY — DX: Essential (primary) hypertension: I10

## 2011-10-16 HISTORY — DX: Depression, unspecified: F32.A

## 2011-10-16 LAB — URINALYSIS, ROUTINE W REFLEX MICROSCOPIC
Bilirubin Urine: NEGATIVE
Ketones, ur: NEGATIVE mg/dL
Nitrite: NEGATIVE
Protein, ur: NEGATIVE mg/dL
pH: 7 (ref 5.0–8.0)

## 2011-10-16 LAB — COMPREHENSIVE METABOLIC PANEL
ALT: 19 U/L (ref 0–53)
Alkaline Phosphatase: 59 U/L (ref 39–117)
BUN: 13 mg/dL (ref 6–23)
CO2: 30 mEq/L (ref 19–32)
Chloride: 98 mEq/L (ref 96–112)
GFR calc Af Amer: 75 mL/min — ABNORMAL LOW (ref >90)
GFR calc non Af Amer: 65 mL/min — ABNORMAL LOW (ref >90)
Glucose, Bld: 114 mg/dL — ABNORMAL HIGH (ref 70–99)
Potassium: 5.1 mEq/L (ref 3.5–5.1)
Sodium: 136 mEq/L (ref 135–145)
Total Bilirubin: 1.1 mg/dL (ref 0.3–1.2)
Total Protein: 7.5 g/dL (ref 6.0–8.3)

## 2011-10-16 LAB — CBC
Hemoglobin: 15.2 g/dL (ref 13.0–17.0)
Platelets: 197 10*3/uL (ref 150–400)
RBC: 4.92 MIL/uL (ref 4.22–5.81)
WBC: 7.5 10*3/uL (ref 4.0–10.5)

## 2011-10-16 LAB — DIFFERENTIAL
Lymphocytes Relative: 15 % (ref 12–46)
Lymphs Abs: 1.1 10*3/uL (ref 0.7–4.0)
Monocytes Relative: 9 % (ref 3–12)
Neutrophils Relative %: 74 % (ref 43–77)

## 2011-10-16 NOTE — ED Notes (Signed)
Pt states "I'm dried out, my mouth is dry when I get up, I've been seeing my doctor, have an appt there @ 1100 but I don't want the run around anymore, I went to the ENT and they just told me I was dried out"

## 2011-10-16 NOTE — ED Provider Notes (Signed)
History     CSN: [id number]  Arrival date & time 10/16/11  0744   First MD Initiated Contact with Patient 10/16/11 [phone]      Chief Complaint  Patient presents with  . Dehydration    (Consider location/radiation/quality/duration/timing/severity/associated sxs/prior treatment) HPI Comments: ` Patient is a 71 year old man who complains of being dry. He notes dryness in his nose and mouth during the night. He has seen his physician, Merri Brunette M.D., and also an ENT doctor, for this condition. He has been advised to drink liquids.  He has also been using a humidifier in his room. None of this seemed to help. He therefore sought evaluation.  Patient is a 71 y.o. male presenting with general illness.  Illness  Episode onset: Patient had onset of nasal and oral dryness starting last summer, some 6 months ago. The onset was gradual. The problem occurs frequently. The problem has been unchanged. The problem is moderate. The symptoms are relieved by nothing. The symptoms are aggravated by nothing. Pertinent negatives include no orthopnea, no abdominal pain, no diarrhea, no nausea, no vomiting, no muscle aches, no neck pain, no cough, no URI, no wheezing and no rash. He has been eating and drinking normally. There were no sick contacts. Recently, medical care has been given by the PCP and by a specialist.    Past Medical History  Diagnosis Date  . Hypertension   . Depression   . Hypercholesteremia     Past Surgical History  Procedure Date  . Abdominal hernia repair   . Joint replacement     bilat knee  . Back surgery     History reviewed. No pertinent family history.  History  Substance Use Topics  . Smoking status: Former Games developer  . Smokeless tobacco: Not on file  . Alcohol Use: No      Review of Systems  Constitutional:       Nasal and oral dryness.  HENT: Negative for neck pain.        Nasal and Oral dryness  Eyes: Negative.   Respiratory: Negative.  Negative for cough  and wheezing.   Cardiovascular: Negative.  Negative for orthopnea.  Gastrointestinal: Negative.  Negative for nausea, vomiting, abdominal pain and diarrhea.  Genitourinary: Negative.   Musculoskeletal: Negative.   Skin: Negative for rash.  Neurological: Negative.   Psychiatric/Behavioral: Negative.     Allergies  Review of patient's allergies indicates no known allergies.  Home Medications   Current Outpatient Rx  Name Route Sig Dispense Refill  . CETIRIZINE HCL 10 MG PO TABS Oral Take 10 mg by mouth daily.      Marland Kitchen VITAMIN D 1000 UNITS PO TABS Oral Take 2,000 Units by mouth daily.      Marland Kitchen FLUNISOLIDE 0.025 % NA SOLN Inhalation Inhale 2 sprays into the lungs 2 (two) times daily.      Marland Kitchen LOSARTAN POTASSIUM 50 MG PO TABS Oral Take 50 mg by mouth daily.      . MULTI-VITAMIN/MINERALS PO TABS Oral Take 1 tablet by mouth daily.      Marland Kitchen ROSUVASTATIN CALCIUM 5 MG PO TABS Oral Take 5 mg by mouth daily.      . SERTRALINE HCL 50 MG PO TABS Oral Take 50 mg by mouth daily.      Marland Kitchen TIOTROPIUM BROMIDE MONOHYDRATE 18 MCG IN CAPS Inhalation Place 18 mcg into inhaler and inhale daily.        BP 172/87  Pulse 75  Temp(Src) 98.5 F (36.9 C) (  Oral)  Resp 18  Wt 218 lb (98.884 kg)  SpO2 99%  Physical Exam  Constitutional: He is oriented to person, place, and time. He appears well-developed and well-nourished. No distress.  HENT:  Head: Normocephalic and atraumatic.  Right Ear: External ear normal.  Left Ear: External ear normal.  Nose: Nose normal.  Mouth/Throat: Oropharynx is clear and moist.       Nose and throat not notably dry to exam.    Neck: Normal range of motion. Neck supple.  Cardiovascular: Normal rate, regular rhythm and normal heart sounds.   Pulmonary/Chest: Effort normal and breath sounds normal.  Abdominal: Soft. Bowel sounds are normal.  Musculoskeletal: Normal range of motion. He exhibits no edema.  Neurological: He is alert and oriented to person, place, and time.       No  sensory or motor deficit.  Skin: Skin is warm and dry.  Psychiatric: He has a normal mood and affect. His behavior is normal.    ED Course  Procedures (including critical care time)   Labs Reviewed  CBC  DIFFERENTIAL  COMPREHENSIVE METABOLIC PANEL  URINALYSIS, ROUTINE W REFLEX MICROSCOPIC    8:25 AM Pt seen --> physical exam performed.  Lab workup ordered.  8:40 AM  Date: 10/16/2011  Rate:64  Rhythm: normal sinus rhythm  QRS Axis: normal  Intervals: normal  ST/T Wave abnormalities: normal  Conduction Disutrbances:none  Narrative Interpretation: Normal EKG  Old EKG Reviewed: unchanged  10:06 AM Results for orders placed during the hospital encounter of 10/16/11  CBC      Component Value Range   WBC 7.5  4.0 - 10.5 (K/uL)   RBC 4.92  4.22 - 5.81 (MIL/uL)   Hemoglobin 15.2  13.0 - 17.0 (g/dL)   HCT 16.1  09.6 - 04.5 (%)   MCV 90.9  78.0 - 100.0 (fL)   MCH 30.9  26.0 - 34.0 (pg)   MCHC 34.0  30.0 - 36.0 (g/dL)   RDW 40.9  81.1 - 91.4 (%)   Platelets 197  150 - 400 (K/uL)  DIFFERENTIAL      Component Value Range   Neutrophils Relative 74  43 - 77 (%)   Neutro Abs 5.6  1.7 - 7.7 (K/uL)   Lymphocytes Relative 15  12 - 46 (%)   Lymphs Abs 1.1  0.7 - 4.0 (K/uL)   Monocytes Relative 9  3 - 12 (%)   Monocytes Absolute 0.7  0.1 - 1.0 (K/uL)   Eosinophils Relative 2  0 - 5 (%)   Eosinophils Absolute 0.1  0.0 - 0.7 (K/uL)   Basophils Relative 0  0 - 1 (%)   Basophils Absolute 0.0  0.0 - 0.1 (K/uL)  COMPREHENSIVE METABOLIC PANEL      Component Value Range   Sodium 136  135 - 145 (mEq/L)   Potassium 5.1  3.5 - 5.1 (mEq/L)   Chloride 98  96 - 112 (mEq/L)   CO2 30  19 - 32 (mEq/L)   Glucose, Bld 114 (*) 70 - 99 (mg/dL)   BUN 13  6 - 23 (mg/dL)   Creatinine, Ser 7.82  0.50 - 1.35 (mg/dL)   Calcium 95.6  8.4 - 10.5 (mg/dL)   Total Protein 7.5  6.0 - 8.3 (g/dL)   Albumin 4.3  3.5 - 5.2 (g/dL)   AST 20  0 - 37 (U/L)   ALT 19  0 - 53 (U/L)   Alkaline Phosphatase 59  39 -  117 (U/L)   Total  Bilirubin 1.1  0.3 - 1.2 (mg/dL)   GFR calc non Af Amer 65 (*) >90 (mL/min)   GFR calc Af Amer 75 (*) >90 (mL/min)  URINALYSIS, ROUTINE W REFLEX MICROSCOPIC      Component Value Range   Color, Urine YELLOW  YELLOW    APPearance CLEAR  CLEAR    Specific Gravity, Urine 1.006  1.005 - 1.030    pH 7.0  5.0 - 8.0    Glucose, UA NEGATIVE  NEGATIVE (mg/dL)   Hgb urine dipstick NEGATIVE  NEGATIVE    Bilirubin Urine NEGATIVE  NEGATIVE    Ketones, ur NEGATIVE  NEGATIVE (mg/dL)   Protein, ur NEGATIVE  NEGATIVE (mg/dL)   Urobilinogen, UA 0.2  0.0 - 1.0 (mg/dL)   Nitrite NEGATIVE  NEGATIVE    Leukocytes, UA NEGATIVE  NEGATIVE    Dg Chest 2 View  10/16/2011  *RADIOLOGY REPORT*  Clinical Data:  dry nose, history of sleep apnea  CHEST - 2 VIEW  Comparison: 03/01/2009  Findings: Cardiomediastinal silhouette is stable.  No acute infiltrate.  Probable chronic mild interstitial prominence without pulmonary edema.  Central mild bronchitic changes.  Mild degenerative changes thoracic spine.  IMPRESSION:  No acute infiltrate.  Probable chronic mild interstitial prominence without pulmonary edema.  Central mild bronchitic changes.  Original Report Authenticated By: Natasha Mead, M.D.       10:06 AM Pt's lab workup was negative.  Case discussed by telephone with Dr. Renne Crigler.  We reviewed pt's medications and decided to have him stop Spiriva, flunisolide, and cetirizine.  He should make a new appointment to see Dr. Renne Crigler in a couple of weeks.  1. Dry mouth           Carleene Cooper III, MD 10/16/11 1026

## 2013-12-14 ENCOUNTER — Ambulatory Visit (INDEPENDENT_AMBULATORY_CARE_PROVIDER_SITE_OTHER): Payer: Medicare Other | Admitting: Pulmonary Disease

## 2013-12-14 ENCOUNTER — Encounter: Payer: Self-pay | Admitting: Pulmonary Disease

## 2013-12-14 VITALS — BP 130/88 | HR 86 | Temp 98.0°F | Ht 65.0 in | Wt 232.0 lb

## 2013-12-14 DIAGNOSIS — G4733 Obstructive sleep apnea (adult) (pediatric): Secondary | ICD-10-CM

## 2013-12-14 DIAGNOSIS — J449 Chronic obstructive pulmonary disease, unspecified: Secondary | ICD-10-CM

## 2013-12-14 NOTE — Patient Instructions (Signed)
You have moderate COPD - lung capacity is at 55% Trial of Breo once daily -sample - RINSE mouth after use, call us for Rx if this works Referral to [facility] rehab program

## 2013-12-14 NOTE — Progress Notes (Signed)
Subjective:    Patient ID: Mark Robertson, male    DOB: 05-01-1941, 73 y.o.   MRN: [id number]  HPI 73 year old ex-smoker referred for evaluation of COPD and obstructive sleep apnea. He was seen in our office in 2007 (wert)after nocturnal oximetry showed who significant desaturation 474 minutes. PFTs also showed moderate obstruction with minimal response to bronchodilators. It was felt that he would not comply with nocturnal oxygen. Repeat spirometry in 11/2013 FEV1-1.02 (40%)--> post BD 1.42 (56%) CXR 10/2011 chronic mild interstitial prominence  without pulmonary edema. Central mild bronchitic changes He used Spiriva for many years, then quit due to dry mouth. 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 He smoked 2 pack spur day until he quit in 2003 about 60 pack years He walks daily for 3 miles at his own pace and denies wheezing or frequent chest colds. He received Pneumovax in 2012 .  Polysomnogram in 2007 when he weighed 204 pounds showed mild obstructive sleep apnea with RDI of 2 events per hour and mild oxygen desaturation with a minimum of 79% for 25% of total sleep time. He does not want further sleep testing and is not interested in CPAP therapy. Epworth sleepiness score is 4/24, bedtime is around 11 PM, sleep latency to 30 minutes, one awakening for bathroom visit and is out of bed with debris between 7 and 10 AM. He is gained 20 pounds since his last study   Past Medical History  Diagnosis Date  . Hypertension   . Depression   . Hypercholesteremia     Past Surgical History  Procedure Laterality Date  . Abdominal hernia repair    . Joint replacement      bilat knee  . Back surgery      No Known Allergies  History   Social History  . Marital Status: Divorced    Spouse Name: N/A    Number of Children: N/A  . Years of Education: N/A   Occupational History  . Not on file.   Social History Main Topics  . Smoking status: Former Smoker -- 2.00 packs/day for 50 years   Types: Cigarettes    Quit date: 10/13/2001  . Smokeless tobacco: Not on file  . Alcohol Use: No  . Drug Use: No  . Sexual Activity: Not on file   Other Topics Concern  . Not on file   Social History Narrative  . No narrative on file    No family history on file.  Review of Systems  Constitutional: Negative for fever and unexpected weight change.  HENT: Positive for sinus pressure and trouble swallowing. Negative for congestion, dental problem, ear pain, nosebleeds, postnasal drip, rhinorrhea, sneezing and sore throat.   Eyes: Negative for redness and itching.  Respiratory: Positive for shortness of breath and wheezing. Negative for cough and chest tightness.   Cardiovascular: Negative for palpitations and leg swelling.  Gastrointestinal: Negative for nausea and vomiting.  Genitourinary: Negative for dysuria.  Musculoskeletal: Negative for joint swelling.  Skin: Negative for rash.  Neurological: Negative for headaches.  Hematological: Does not bruise/bleed easily.  Psychiatric/Behavioral: Positive for dysphoric mood. The patient is not nervous/anxious.        Objective:   Physical Exam  Gen. Pleasant, obese, in no distress, normal affect ENT - no lesions, no post nasal drip, class 2-3 airway Neck: No JVD, no thyromegaly, no carotid bruits Lungs: no use of accessory muscles, no dullness to percussion, decreased without rales or rhonchi  Cardiovascular: Rhythm regular, heart sounds  normal, no murmurs or gallops, no peripheral edema Abdomen: soft and non-tender, no hepatosplenomegaly, BS normal. Musculoskeletal: No deformities, no cyanosis or clubbing Neuro:  alert, non focal, no tremors       Assessment & Plan:

## 2013-12-14 NOTE — Assessment & Plan Note (Signed)
You have moderate COPD - lung capacity is at 55% Trial of Breo once daily -sample - RINSE mouth after use, call us for Rx if this works Referral to [facility] rehab program Consider for IMPACT study cxr on next visit He will qualify for screening for lung cancer

## 2013-12-14 NOTE — Assessment & Plan Note (Signed)
He is not interested in further testing or therapy. Can consider repeating nocturnal oximetry in the future once we have optimize lung function

## 2013-12-22 ENCOUNTER — Telehealth: Payer: Self-pay | Admitting: Pulmonary Disease

## 2013-12-22 MED ORDER — UMECLIDINIUM-VILANTEROL 62.5-25 MCG/INH IN AEPB: 1.0000 | INHALATION_SPRAY | Freq: Every day | RESPIRATORY_TRACT | Status: DC

## 2013-12-22 NOTE — Telephone Encounter (Signed)
lmtcb x1 

## 2013-12-22 NOTE — Telephone Encounter (Signed)
Pt is returning call & can be reached at [phone].  Mark Robertson

## 2013-12-22 NOTE — Telephone Encounter (Signed)
Called and spoke with pt. He reports anoro seems to be helping. He is requesting RX to be called in. I have done so. Nothing further needed

## 2013-12-22 NOTE — Telephone Encounter (Signed)
Call in to [facility] [address]Hillery Hunter

## 2013-12-23 ENCOUNTER — Telehealth: Payer: Self-pay | Admitting: Pulmonary Disease

## 2013-12-23 MED ORDER — FLUTICASONE FUROATE-VILANTEROL 100-25 MCG/INH IN AEPB: 1.0000 | INHALATION_SPRAY | Freq: Every day | RESPIRATORY_TRACT | Status: DC

## 2013-12-23 NOTE — Telephone Encounter (Signed)
I spoke with the pt and he states he went to pick-up Rx for breo and Anoro was called in. He states he paid $300 for this but did not notice until he got home that it was the wrong medication. I advised I am not sure that the pharmacy will take it back but he can try. I advised that I will get him a month of samples and leave it at the front to help off set the cost. I apologized for the error. [place] Bing,   Mindy called pt pharmacy and they stated that if he has not opened the Rx then they will take it back. I have LM to advise the pt of this. Samples are at front and pt is aware of this. [place] Bing, CMA

## 2013-12-23 NOTE — Telephone Encounter (Signed)
Pt is returning call.  Pt states that he did pick up samples from our office & returned the medication to the pharmacy already.  Pt states nothing further needed at this time & will CB when an Rx is needed.  Satira Anis

## 2014-02-10 ENCOUNTER — Telehealth (HOSPITAL_COMMUNITY): Payer: Self-pay

## 2014-02-10 NOTE — Telephone Encounter (Signed)
Contacted patient regarding Pulmonary Rehab.  Patient stated that with his insurance he could not afford the co-pay.

## 2014-02-14 ENCOUNTER — Telehealth: Payer: Self-pay | Admitting: Pulmonary Disease

## 2014-02-14 MED ORDER — FLUTICASONE FUROATE-VILANTEROL 100-25 MCG/INH IN AEPB: 1.0000 | INHALATION_SPRAY | Freq: Every day | RESPIRATORY_TRACT | Status: DC

## 2014-02-14 NOTE — Telephone Encounter (Signed)
Rx has been sent in. Pt is aware. 

## 2014-09-29 ENCOUNTER — Encounter (HOSPITAL_COMMUNITY): Payer: Self-pay

## 2014-09-29 ENCOUNTER — Emergency Department (HOSPITAL_COMMUNITY): Payer: Medicare Other

## 2014-09-29 ENCOUNTER — Inpatient Hospital Stay (HOSPITAL_COMMUNITY)
Admission: EM | Admit: 2014-09-29 | Discharge: 2014-09-30 | DRG: 195 | Disposition: A | Discharge status: Home / Self Care | Payer: Medicare Other | Attending: Internal Medicine | Admitting: Internal Medicine

## 2014-09-29 DIAGNOSIS — J449 Chronic obstructive pulmonary disease, unspecified: Secondary | ICD-10-CM | POA: Diagnosis present

## 2014-09-29 DIAGNOSIS — Z7982 Long term (current) use of aspirin: Secondary | ICD-10-CM | POA: Diagnosis not present

## 2014-09-29 DIAGNOSIS — E785 Hyperlipidemia, unspecified: Secondary | ICD-10-CM | POA: Diagnosis present

## 2014-09-29 DIAGNOSIS — J189 Pneumonia, unspecified organism: Secondary | ICD-10-CM | POA: Diagnosis not present

## 2014-09-29 DIAGNOSIS — Z87891 Personal history of nicotine dependence: Secondary | ICD-10-CM | POA: Diagnosis not present

## 2014-09-29 DIAGNOSIS — F329 Major depressive disorder, single episode, unspecified: Secondary | ICD-10-CM | POA: Diagnosis present

## 2014-09-29 DIAGNOSIS — Z79899 Other long term (current) drug therapy: Secondary | ICD-10-CM | POA: Diagnosis not present

## 2014-09-29 DIAGNOSIS — K224 Dyskinesia of esophagus: Secondary | ICD-10-CM | POA: Diagnosis present

## 2014-09-29 DIAGNOSIS — Z8249 Family history of ischemic heart disease and other diseases of the circulatory system: Secondary | ICD-10-CM | POA: Diagnosis not present

## 2014-09-29 DIAGNOSIS — R059 Cough, unspecified: Secondary | ICD-10-CM | POA: Diagnosis present

## 2014-09-29 DIAGNOSIS — Z823 Family history of stroke: Secondary | ICD-10-CM

## 2014-09-29 DIAGNOSIS — K449 Diaphragmatic hernia without obstruction or gangrene: Secondary | ICD-10-CM

## 2014-09-29 DIAGNOSIS — R05 Cough: Secondary | ICD-10-CM | POA: Diagnosis present

## 2014-09-29 DIAGNOSIS — E780 Pure hypercholesterolemia: Secondary | ICD-10-CM | POA: Diagnosis present

## 2014-09-29 DIAGNOSIS — K228 Other specified diseases of esophagus: Secondary | ICD-10-CM | POA: Diagnosis present

## 2014-09-29 DIAGNOSIS — I10 Essential (primary) hypertension: Secondary | ICD-10-CM | POA: Diagnosis present

## 2014-09-29 DIAGNOSIS — K2289 Other specified disease of esophagus: Secondary | ICD-10-CM

## 2014-09-29 DIAGNOSIS — J440 Chronic obstructive pulmonary disease with acute lower respiratory infection: Secondary | ICD-10-CM

## 2014-09-29 DIAGNOSIS — R1310 Dysphagia, unspecified: Secondary | ICD-10-CM

## 2014-09-29 DIAGNOSIS — J984 Other disorders of lung: Secondary | ICD-10-CM

## 2014-09-29 HISTORY — DX: Diaphragmatic hernia without obstruction or gangrene: K44.9

## 2014-09-29 HISTORY — DX: Other disorders of lung: J18.9

## 2014-09-29 HISTORY — DX: Other disorders of lung: J98.4

## 2014-09-29 HISTORY — DX: Other specified disease of esophagus: K22.89

## 2014-09-29 HISTORY — DX: Chronic obstructive pulmonary disease, unspecified: J44.9

## 2014-09-29 HISTORY — DX: Pneumonia, unspecified organism: J18.9

## 2014-09-29 HISTORY — DX: Dyskinesia of esophagus: K22.4

## 2014-09-29 HISTORY — DX: Other specified diseases of esophagus: K22.8

## 2014-09-29 LAB — HEPATIC FUNCTION PANEL
ALK PHOS: 99 U/L (ref 39–117)
ALT: 36 U/L (ref 0–53)
AST: 26 U/L (ref 0–37)
Albumin: 3.1 g/dL — ABNORMAL LOW (ref 3.5–5.2)
BILIRUBIN TOTAL: 0.3 mg/dL (ref 0.3–1.2)
Bilirubin, Direct: <0.2 mg/dL (ref 0.0–0.3)
Total Protein: 7.3 g/dL (ref 6.0–8.3)

## 2014-09-29 LAB — I-STAT CHEM 8, ED
BUN: 15 mg/dL (ref 6–23)
CALCIUM ION: 1.12 mmol/L — AB (ref 1.13–1.30)
CREATININE: 1.2 mg/dL (ref 0.50–1.35)
Chloride: 96 mEq/L (ref 96–112)
GLUCOSE: 115 mg/dL — AB (ref 70–99)
HCT: 43 % (ref 39.0–52.0)
HEMOGLOBIN: 14.6 g/dL (ref 13.0–17.0)
Potassium: 4.5 mEq/L (ref 3.7–5.3)
Sodium: 136 mEq/L — ABNORMAL LOW (ref 137–147)
TCO2: 33 mmol/L (ref 0–100)

## 2014-09-29 LAB — CBC WITH DIFFERENTIAL/PLATELET
BASOS ABS: 0 10*3/uL (ref 0.0–0.1)
Basophils Relative: 0 % (ref 0–1)
EOS PCT: 4 % (ref 0–5)
Eosinophils Absolute: 0.4 10*3/uL (ref 0.0–0.7)
HCT: 41.9 % (ref 39.0–52.0)
Hemoglobin: 13 g/dL (ref 13.0–17.0)
Lymphocytes Relative: 10 % — ABNORMAL LOW (ref 12–46)
Lymphs Abs: 1.2 10*3/uL (ref 0.7–4.0)
MCH: 30 pg (ref 26.0–34.0)
MCHC: 31 g/dL (ref 30.0–36.0)
MCV: 96.8 fL (ref 78.0–100.0)
Monocytes Absolute: 1.3 10*3/uL — ABNORMAL HIGH (ref 0.1–1.0)
Monocytes Relative: 11 % (ref 3–12)
NEUTROS PCT: 75 % (ref 43–77)
Neutro Abs: 9.3 10*3/uL — ABNORMAL HIGH (ref 1.7–7.7)
PLATELETS: 418 10*3/uL — AB (ref 150–400)
RBC: 4.33 MIL/uL (ref 4.22–5.81)
RDW: 13.3 % (ref 11.5–15.5)
WBC: 12.3 10*3/uL — ABNORMAL HIGH (ref 4.0–10.5)

## 2014-09-29 LAB — CREATININE, SERUM
Creatinine, Ser: 1.08 mg/dL (ref 0.50–1.35)
GFR, EST AFRICAN AMERICAN: 76 mL/min — AB (ref >90)
GFR, EST NON AFRICAN AMERICAN: 66 mL/min — AB (ref >90)

## 2014-09-29 LAB — HIV ANTIBODY (ROUTINE TESTING W REFLEX): HIV: NONREACTIVE

## 2014-09-29 MED ORDER — SODIUM CHLORIDE 0.9 % IV SOLN
3.0000 g | Freq: Four times a day (QID) | INTRAVENOUS | Status: DC
  Administered 2014-09-30 (×3): 3 g via INTRAVENOUS
  Filled 2014-09-29 (×4): qty 3

## 2014-09-29 MED ORDER — CLEAR EYES COMPLETE OP SOLN: 1.0000 [drp] | Freq: Two times a day (BID) | OPHTHALMIC | Status: DC | PRN

## 2014-09-29 MED ORDER — HYDROCOD POLST-CHLORPHEN POLST 10-8 MG/5ML PO LQCR
5.0000 mL | Freq: Every day | ORAL | Status: DC
  Administered 2014-09-29: 5 mL via ORAL
  Filled 2014-09-29: qty 5

## 2014-09-29 MED ORDER — ADULT MULTIVITAMIN W/MINERALS CH
ORAL_TABLET | Freq: Every day | ORAL | Status: DC
  Administered 2014-09-30: 1 via ORAL
  Filled 2014-09-29 (×3): qty 1

## 2014-09-29 MED ORDER — VANCOMYCIN HCL 10 G IV SOLR
1250.0000 mg | INTRAVENOUS | Status: DC
  Filled 2014-09-29: qty 1250

## 2014-09-29 MED ORDER — POLYVINYL ALCOHOL 1.4 % OP SOLN
1.0000 [drp] | OPHTHALMIC | Status: DC | PRN
  Filled 2014-09-29: qty 15

## 2014-09-29 MED ORDER — IOHEXOL 300 MG/ML  SOLN
100.0000 mL | Freq: Once | INTRAMUSCULAR | Status: AC | PRN
  Administered 2014-09-29: 100 mL via INTRAVENOUS

## 2014-09-29 MED ORDER — PIPERACILLIN-TAZOBACTAM 3.375 G IVPB 30 MIN
3.3750 g | Freq: Once | INTRAVENOUS | Status: AC
  Administered 2014-09-29: 3.375 g via INTRAVENOUS
  Filled 2014-09-29: qty 50

## 2014-09-29 MED ORDER — MOMETASONE FURO-FORMOTEROL FUM 100-5 MCG/ACT IN AERO
2.0000 | INHALATION_SPRAY | Freq: Two times a day (BID) | RESPIRATORY_TRACT | Status: DC
  Administered 2014-09-29 – 2014-09-30 (×2): 2 via RESPIRATORY_TRACT
  Filled 2014-09-29: qty 8.8

## 2014-09-29 MED ORDER — ROSUVASTATIN CALCIUM 10 MG PO TABS
10.0000 mg | ORAL_TABLET | Freq: Every evening | ORAL | Status: DC
  Administered 2014-09-29: 10 mg via ORAL
  Filled 2014-09-29 (×2): qty 1

## 2014-09-29 MED ORDER — FLUTICASONE FUROATE-VILANTEROL 100-25 MCG/INH IN AEPB: 1.0000 | INHALATION_SPRAY | Freq: Every day | RESPIRATORY_TRACT | Status: DC

## 2014-09-29 MED ORDER — FLUTICASONE PROPIONATE 50 MCG/ACT NA SUSP
1.0000 | Freq: Every day | NASAL | Status: DC | PRN
  Filled 2014-09-29: qty 16

## 2014-09-29 MED ORDER — CYCLOBENZAPRINE HCL 5 MG PO TABS
5.0000 mg | ORAL_TABLET | Freq: Every evening | ORAL | Status: DC | PRN
  Filled 2014-09-29: qty 1

## 2014-09-29 MED ORDER — SODIUM CHLORIDE 0.9 % IV SOLN
3.0000 g | INTRAVENOUS | Status: AC
  Administered 2014-09-29: 3 g via INTRAVENOUS
  Filled 2014-09-29: qty 3

## 2014-09-29 MED ORDER — VITAMIN D3 25 MCG (1000 UNIT) PO TABS
2000.0000 [IU] | ORAL_TABLET | Freq: Every day | ORAL | Status: DC
  Administered 2014-09-30: 2000 [IU] via ORAL
  Filled 2014-09-29: qty 2

## 2014-09-29 MED ORDER — SODIUM CHLORIDE 0.9 % IV SOLN
2000.0000 mg | INTRAVENOUS | Status: AC
  Administered 2014-09-29: 2000 mg via INTRAVENOUS
  Filled 2014-09-29: qty 2000

## 2014-09-29 MED ORDER — SODIUM CHLORIDE 0.9 % IV SOLN
INTRAVENOUS | Status: DC
  Administered 2014-09-29: 16:00:00 via INTRAVENOUS

## 2014-09-29 MED ORDER — ASPIRIN EC 81 MG PO TBEC
81.0000 mg | DELAYED_RELEASE_TABLET | Freq: Every day | ORAL | Status: DC
  Administered 2014-09-29 – 2014-09-30 (×2): 81 mg via ORAL
  Filled 2014-09-29 (×2): qty 1

## 2014-09-29 MED ORDER — LOSARTAN POTASSIUM 50 MG PO TABS
50.0000 mg | ORAL_TABLET | Freq: Every day | ORAL | Status: DC
  Administered 2014-09-30: 50 mg via ORAL
  Filled 2014-09-29: qty 1

## 2014-09-29 MED ORDER — ENOXAPARIN SODIUM 40 MG/0.4ML ~~LOC~~ SOLN
40.0000 mg | SUBCUTANEOUS | Status: DC
  Administered 2014-09-29: 40 mg via SUBCUTANEOUS
  Filled 2014-09-29 (×2): qty 0.4

## 2014-09-29 NOTE — H&P (Addendum)
History and Physical:    Mark Robertson XHB:[id number] DOB: 12/14/1940 DOA: 09/29/2014  Referring physician: Dr. Christy Gentles PCP: Horatio Pel, MD   Chief Complaint: Nocturnal cough  History of Present Illness:   Mark Robertson is an 73 y.o. male with a PMH of COPD, prior tobacco abuse (quit 12 years ago, 2 ppd habit), who presents with a 2 week history of nocturnal cough and loss of appetite.  Denies fever/chills, obvious weight loss.  He does endorse occasional night sweats.  He has been more fatigued than usual.  The cough is mostly dry, but sputum is white when he does expectorate.  He thinks he simply may have tired himself out by walking 2 miles a day.  Upon initial evaluation in the ER, the patient was afebrile with a WBC of 12.3. Chest x-ray and CT scan were done which showed findings concerning for a cavitary pneumonia/possible early lung abscess.  ROS:   Constitutional: No fever, no chills;  Appetite normal; No weight loss, no weight gain, no fatigue.  HEENT: No blurry vision, no diplopia, no pharyngitis, +solid food dysphagia CV: No chest pain, no palpitations, no PND, no orthopnea, no edema.  Resp: No SOB, + cough, no pleuritic pain. GI: No nausea, no vomiting, no diarrhea, no melena, no hematochezia, no constipation, no abdominal pain.  GU: No dysuria, no hematuria, no frequency, no urgency. MSK: + generalized myalgias x 2 weeks, but improved, no arthralgias.  Neuro:  No headache, no focal neurological deficits, no history of seizures.  Psych: + depression, no anxiety.  Endo: No heat intolerance, no cold intolerance, no polyuria, no polydipsia  Skin: No rashes, no skin lesions.  Heme: No easy bruising.  Travel history: No recent travel.   Past Medical History:   Past Medical History  Diagnosis Date  . Hypertension   . Depression   . Hypercholesteremia   . COPD (chronic obstructive pulmonary disease)   . Cavitary pneumonia 09/29/2014  . Esophageal thickening  09/29/2014    Past Surgical History:   Past Surgical History  Procedure Laterality Date  . Abdominal hernia repair    . Joint replacement      bilat knee  . Back surgery      Social History:   History   Social History  . Marital Status: Divorced    Spouse Name: N/A    Number of Children: 3  . Years of Education: N/A   Occupational History  . Retired Chief Executive Officer.    Social History Main Topics  . Smoking status: Former Smoker -- 2.00 packs/day for 50 years    Types: Cigarettes    Quit date: 10/13/2001  . Smokeless tobacco: Not on file  . Alcohol Use: Yes     Comment: occasionally  . Drug Use: No  . Sexual Activity: Not on file   Other Topics Concern  . Not on file   Social History Narrative   Divorced.  Retired Chief Executive Officer.  Lives alone.  Ambulates independent.     Family history:   Family History  Problem Relation Age of Onset  . Stroke Father   . Stroke Sister   . Heart failure Brother     Allergies   Review of patient's allergies indicates no known allergies.  Current Medications:   Prior to Admission medications   Medication Sig Start Date End Date Taking? Authorizing Provider  aspirin EC 81 MG tablet Take 81 mg by mouth every evening.   Yes Historical Provider, MD  benzonatate (  TESSALON) 100 MG capsule Take 100 mg by mouth 3 (three) times daily as needed for cough.   Yes Historical Provider, MD  cholecalciferol (VITAMIN D) 1000 UNITS tablet Take 2,000 Units by mouth daily.     Yes Historical Provider, MD  cyclobenzaprine (FLEXERIL) 10 MG tablet Take 5 mg by mouth at bedtime as needed for muscle spasms.   Yes Historical Provider, MD  flunisolide (NASALIDE) 0.025 % SOLN Inhale 1 spray into the lungs daily as needed (for allergies).    Yes Historical Provider, MD  Fluticasone Furoate-Vilanterol (BREO ELLIPTA) 100-25 MCG/INH AEPB Inhale 1 puff into the lungs daily. 02/14/14  Yes Rigoberto Noel, MD  Hyprom-Naphaz-Polysorb-Zn Sulf (CLEAR EYES COMPLETE) SOLN Place 1-2  drops into both eyes 2 (two) times daily as needed (for dry eyes).   Yes Historical Provider, MD  losartan (COZAAR) 50 MG tablet Take 50 mg by mouth daily with breakfast.    Yes Historical Provider, MD  Multiple Vitamins-Minerals (CENTRUM SILVER) tablet Take 1 tablet by mouth daily with breakfast.   Yes Historical Provider, MD  naproxen sodium (ANAPROX) 220 MG tablet Take 220-440 mg by mouth daily as needed (for pain). Takes 440mg  in the morning and may take another 220mg  at night if needed   Yes Historical Provider, MD  Phenyleph-Doxylamine-DM-APAP (ALKA-SELTZER PLUS COLD & FLU) 10-12.5-20-650 MG PACK Take 1 packet by mouth every 6 (six) hours as needed (for cold).   Yes Historical Provider, MD  Phenylephrine-DM-GG ([place]) 2.5-5-100 MG/5ML LIQD Take 30 mLs by mouth every 4 (four) hours as needed (for cold and cough).   Yes Historical Provider, MD  rosuvastatin (CRESTOR) 10 MG tablet Take 10 mg by mouth every evening.   Yes Historical Provider, MD  Throat Lozenges (CHERRY COUGH DROPS) 6.1 MG LOZG Use as directed 1 lozenge in the mouth or throat as needed (for cough).   Yes Historical Provider, MD    Physical Exam:   Filed Vitals:   09/29/14 1430 09/29/14 1500 09/29/14 1507 09/29/14 1535  BP: 128/69 147/75 147/75 151/104  Pulse: 73 83 85 90  Temp:    97.6 F (36.4 C)  TempSrc:    Oral  Resp: 32 18 16 18   Height:    5\' 5"  (1.651 m)  Weight:    102.059 kg (225 lb)  SpO2: 92% 91% 93%      Physical Exam: Blood pressure 151/104, pulse 90, temperature 97.6 F (36.4 C), temperature source Oral, resp. rate 18, height 5\' 5"  (1.651 m), weight 102.059 kg (225 lb), SpO2 93 %. Gen: No acute distress. Head: Normocephalic, atraumatic. Eyes: PERRL, EOMI, sclerae nonicteric. Mouth: Oropharynx clear.  Fair dentition with no obvious caries, gum inflammation, or tooth abscesses. Neck: Supple, no thyromegaly, no lymphadenopathy, no jugular venous distention. Chest: Lungs clear to  auscultation bilaterally with good air movement. CV: Heart sounds are regular. No murmurs, rubs, or gallops. Abdomen: Soft, nontender, nondistended with normal active bowel sounds. Extremities: Extremities are without clubbing, edema, or cyanosis. Skin: Warm and dry. Neuro: Alert and oriented times 3; cranial nerves II through XII grossly intact. Psych: Mood and affect normal.   Data Review:    Labs: Basic Metabolic Panel:  Recent Labs Lab 09/29/14 1025  NA 136*  K 4.5  CL 96  GLUCOSE 115*  BUN 15  CREATININE 1.20   CBC:  Recent Labs Lab 09/29/14 1018 09/29/14 1025  WBC 12.3*  --   NEUTROABS 9.3*  --   HGB 13.0 14.6  HCT 41.9 43.0  MCV 96.8  --   PLT 418*  --     Radiographic Studies: Dg Chest 2 View  09/29/2014   CLINICAL DATA:  Cough and loss of appetite, history of prior smoking  EXAM: CHEST  2 VIEW  COMPARISON:  10/16/2011  FINDINGS: Cardiac shadow is within normal limits. Scattered calcified granulomas are again. In the right upper lobe along the minor fissure there is a geographic area of increased density identified. On the lateral projection it appears to represent a mass with some peripheral consolidation. CT of the chest is recommended for further evaluation no other focal abnormality is seen.  IMPRESSION: Changes suggestive of a right upper lobe mass with peripheral consolidation. CT of the chest with contrast is recommended for further evaluation.  These results will be called to the ordering clinician or representative by the Radiologist Assistant, and communication documented in the PACS or zVision Dashboard.   Electronically Signed   By: Inez Catalina M.D.   On: 09/29/2014 09:53   Ct Chest W Contrast  09/29/2014   CLINICAL DATA:  Productive cough for 2 weeks. Patient denies fever. Abnormal x-ray.  EXAM: CT CHEST WITH CONTRAST  TECHNIQUE: Multidetector CT imaging of the chest was performed during intravenous contrast administration.  CONTRAST:  111mL  OMNIPAQUE IOHEXOL 300 MG/ML  SOLN  COMPARISON:  Radiographs 09/29/2014 and 10/16/2011.  FINDINGS: Mediastinum: There are small reactive right hilar and mediastinal lymph nodes, not pathologically enlarged. The esophagus is mildly dilated and demonstrates diffuse wall thickening. The trachea and thyroid gland appear unremarkable. The heart size is normal. There is no pericardial effusion.There are moderate left-sided coronary artery calcifications.  Lungs/Pleura: There is no pleural effusion.As demonstrated radiographically, there is an ill-defined peripheral right upper lobe density measuring approximately 5.4 x 4.6 cm transverse. This has a central well-defined low-density component measuring 2.6 x 3.0 cm on image 29. There is surrounding patchy airspace disease in the right upper lobe. The right middle and lower lobes are clear. There is a small subpleural nodule in the right middle lobe, measuring 7 mm on sagittal image number 19. The left lung is clear. There are small calcified left upper lobe granulomas.  Upper abdomen: No evidence of adrenal mass. There is a punctate calcification within the lateral limb of the left adrenal gland, likely postinflammatory. The visualized liver appears normal.  Musculoskeletal/Chest wall: There is no axillary adenopathy or chest wall mass. There are no worrisome osseous findings.  IMPRESSION: 1. Right upper lobe consolidation with central low-density most consistent with cavitary pneumonia in this clinical context. Findings may reflect an early lung abscess. Cavitary neoplasm is felt to be unlikely. Radiographic follow up after appropriate antibiotic therapy recommended. 2. No significant pleural effusion or empyema. 3. Esophageal wall thickening and mild dilatation suggesting esophageal dysmotility. 4. Mild reactive adenopathy in the right hilum and mediastinum. Evidence of prior granulomatous disease.   Electronically Signed   By: Camie Patience M.D.   On: 09/29/2014 12:27     EKG: Independently reviewed. Sinus rhythm at 90 bpm. No ST or T-wave abnormalities appreciated.   Assessment/Plan:   Principal Problem:   Cavitary pneumonia  We'll treat with Unasyn and vancomycin. Case discussed with Dr. Linus Salmons who recommends a 3-4 week course of therapy (can switch to Augmentin/Bactrim at D/C).  If he does not improve, will need to R/O MAC.   Sputum culture, blood cultures, strep and legionella urinary antigens as well as HIV serologies ordered.  Will need a F/U  CXR in 4-6 weeks to ensure resolution.   Active Problems:   COPD (chronic obstructive pulmonary disease)  Continue Flonase and Dulera.    Esophageal thickening  Given significant smoking history, there is concern for esophageal cancer. Speech therapy evaluation with barium swallow to evaluate dysphagia.    Essential hypertension  Continue Cozaar.    Hyperlipidemia  Continue Crestor.    Cough  Tussionex 5 mL daily at bedtime ordered.    DVT prophylaxis  Lovenox ordered.  Code Status: Full. Family Communication: Rhylee Nunn (son): [phone] is emergency contact.  No family present at bedside. Disposition Plan: Home when stable.  Time spent: 65 minutes.  Channon Ambrosini [facility] Pager [phone] Cell: [phone]   If 7PM-7AM, please contact night-coverage [email or web address] Password [email or web address] 09/29/2014, 4:16 PM

## 2014-09-29 NOTE — ED Notes (Signed)
Attempted to call report. Receiving nurse not available. Another nurse to call back for report.

## 2014-09-29 NOTE — ED Notes (Signed)
Per Radiologist, RU lobe mass, needs CT with contrast

## 2014-09-29 NOTE — ED Provider Notes (Signed)
CSN: [id number]     Arrival date & time 09/29/14  0998 History   First MD Initiated Contact with Patient 09/29/14 [phone]     Chief Complaint  Patient presents with  . Cough    Patient is a 73 y.o. male presenting with cough. The history is provided by the patient.  Cough Severity:  Moderate Onset quality:  Gradual Duration:  2 weeks Timing:  Intermittent Progression:  Worsening Chronicity:  New Relieved by:  Rest Worsened by:  Lying down Associated symptoms: no chest pain and no fever    Patient presents with cough for 2 weeks He reports it is worse at night It improves throughout the day No hemoptysis No CP is reported  Past Medical History  Diagnosis Date  . Hypertension   . Depression   . Hypercholesteremia   . COPD (chronic obstructive pulmonary disease)    Past Surgical History  Procedure Laterality Date  . Abdominal hernia repair    . Joint replacement      bilat knee  . Back surgery     History reviewed. No pertinent family history. History  Substance Use Topics  . Smoking status: Former Smoker -- 2.00 packs/day for 50 years    Types: Cigarettes    Quit date: 10/13/2001  . Smokeless tobacco: Not on file  . Alcohol Use: Yes     Comment: occasionally    Review of Systems  Constitutional: Negative for fever.  Respiratory: Positive for cough.   Cardiovascular: Negative for chest pain.  All other systems reviewed and are negative.     Allergies  Review of patient's allergies indicates no known allergies.  Home Medications   Prior to Admission medications   Medication Sig Start Date End Date Taking? Authorizing Provider  aspirin EC 81 MG tablet Take 81 mg by mouth every evening.   Yes Historical Provider, MD  benzonatate (TESSALON) 100 MG capsule Take 100 mg by mouth 3 (three) times daily as needed for cough.   Yes Historical Provider, MD  cholecalciferol (VITAMIN D) 1000 UNITS tablet Take 2,000 Units by mouth daily.     Yes Historical Provider, MD   cyclobenzaprine (FLEXERIL) 10 MG tablet Take 5 mg by mouth at bedtime as needed for muscle spasms.   Yes Historical Provider, MD  flunisolide (NASALIDE) 0.025 % SOLN Inhale 1 spray into the lungs daily as needed (for allergies).    Yes Historical Provider, MD  Fluticasone Furoate-Vilanterol (BREO ELLIPTA) 100-25 MCG/INH AEPB Inhale 1 puff into the lungs daily. 02/14/14  Yes Rigoberto Noel, MD  Hyprom-Naphaz-Polysorb-Zn Sulf (CLEAR EYES COMPLETE) SOLN Place 1-2 drops into both eyes 2 (two) times daily as needed (for dry eyes).   Yes Historical Provider, MD  losartan (COZAAR) 50 MG tablet Take 50 mg by mouth daily with breakfast.    Yes Historical Provider, MD  Multiple Vitamins-Minerals (CENTRUM SILVER) tablet Take 1 tablet by mouth daily with breakfast.   Yes Historical Provider, MD  naproxen sodium (ANAPROX) 220 MG tablet Take 220-440 mg by mouth daily as needed (for pain). Takes 440mg  in the morning and may take another 220mg  at night if needed   Yes Historical Provider, MD  Phenyleph-Doxylamine-DM-APAP (ALKA-SELTZER PLUS COLD & FLU) 10-12.5-20-650 MG PACK Take 1 packet by mouth every 6 (six) hours as needed (for cold).   Yes Historical Provider, MD  Phenylephrine-DM-GG ([place]) 2.5-5-100 MG/5ML LIQD Take 30 mLs by mouth every 4 (four) hours as needed (for cold and cough).  Yes Historical Provider, MD  rosuvastatin (CRESTOR) 10 MG tablet Take 10 mg by mouth every evening.   Yes Historical Provider, MD  Throat Lozenges (CHERRY COUGH DROPS) 6.1 MG LOZG Use as directed 1 lozenge in the mouth or throat as needed (for cough).   Yes Historical Provider, MD   BP 149/67 mmHg  Pulse 82  Temp(Src) 97.3 F (36.3 C) (Oral)  Resp 15  SpO2 92% Physical Exam CONSTITUTIONAL: Well developed/well nourished HEAD: Normocephalic/atraumatic EYES: EOMI/PERRL ENMT: Mucous membranes moist NECK: supple no meningeal signs SPINE/BACK:entire spine nontender CV: S1/S2 noted, no  murmurs/rubs/gallops noted LUNGS: Lungs are clear to auscultation bilaterally, no apparent distress ABDOMEN: soft, nontender, no rebound or guarding, bowel sounds noted throughout abdomen GU:no cva tenderness NEURO: Pt is awake/alert/appropriate, moves all extremitiesx4.  No facial droop.   EXTREMITIES: pulses normal/equal, full ROM SKIN: warm, color normal PSYCH: no abnormalities of mood noted, alert and oriented to situation  ED Course  Procedures    2:08 PM Initial CXR concerning for malignancy CT chest reveals cavitary pneumonia Pt denies wt loss/hemoptysis/night sweats but he does have mild hypoxia Will admit D/w dr Rama with [facility] - she requests blood cultures and starting zosyn Labs Review Labs Reviewed  I-STAT CHEM 8, ED - Abnormal; Notable for the following:    Sodium 136 (*)    Glucose, Bld 115 (*)    Calcium, Ion 1.12 (*)    All other components within normal limits  CBC WITH DIFFERENTIAL    Imaging Review Dg Chest 2 View  09/29/2014   CLINICAL DATA:  Cough and loss of appetite, history of prior smoking  EXAM: CHEST  2 VIEW  COMPARISON:  10/16/2011  FINDINGS: Cardiac shadow is within normal limits. Scattered calcified granulomas are again. In the right upper lobe along the minor fissure there is a geographic area of increased density identified. On the lateral projection it appears to represent a mass with some peripheral consolidation. CT of the chest is recommended for further evaluation no other focal abnormality is seen.  IMPRESSION: Changes suggestive of a right upper lobe mass with peripheral consolidation. CT of the chest with contrast is recommended for further evaluation.  These results will be called to the ordering clinician or representative by the Radiologist Assistant, and communication documented in the PACS or zVision Dashboard.   Electronically Signed   By: Inez Catalina M.D.   On: 09/29/2014 09:53   Ct Chest W Contrast  09/29/2014   CLINICAL DATA:   Productive cough for 2 weeks. Patient denies fever. Abnormal x-ray.  EXAM: CT CHEST WITH CONTRAST  TECHNIQUE: Multidetector CT imaging of the chest was performed during intravenous contrast administration.  CONTRAST:  160mL OMNIPAQUE IOHEXOL 300 MG/ML  SOLN  COMPARISON:  Radiographs 09/29/2014 and 10/16/2011.  FINDINGS: Mediastinum: There are small reactive right hilar and mediastinal lymph nodes, not pathologically enlarged. The esophagus is mildly dilated and demonstrates diffuse wall thickening. The trachea and thyroid gland appear unremarkable. The heart size is normal. There is no pericardial effusion.There are moderate left-sided coronary artery calcifications.  Lungs/Pleura: There is no pleural effusion.As demonstrated radiographically, there is an ill-defined peripheral right upper lobe density measuring approximately 5.4 x 4.6 cm transverse. This has a central well-defined low-density component measuring 2.6 x 3.0 cm on image 29. There is surrounding patchy airspace disease in the right upper lobe. The right middle and lower lobes are clear. There is a small subpleural nodule in the right middle lobe, measuring 7 mm on sagittal image  number 19. The left lung is clear. There are small calcified left upper lobe granulomas.  Upper abdomen: No evidence of adrenal mass. There is a punctate calcification within the lateral limb of the left adrenal gland, likely postinflammatory. The visualized liver appears normal.  Musculoskeletal/Chest wall: There is no axillary adenopathy or chest wall mass. There are no worrisome osseous findings.  IMPRESSION: 1. Right upper lobe consolidation with central low-density most consistent with cavitary pneumonia in this clinical context. Findings may reflect an early lung abscess. Cavitary neoplasm is felt to be unlikely. Radiographic follow up after appropriate antibiotic therapy recommended. 2. No significant pleural effusion or empyema. 3. Esophageal wall thickening and mild  dilatation suggesting esophageal dysmotility. 4. Mild reactive adenopathy in the right hilum and mediastinum. Evidence of prior granulomatous disease.   Electronically Signed   By: Camie Patience M.D.   On: 09/29/2014 12:27     EKG Interpretation   Date/Time:  Friday September 29 2014 10:16:34 EST Ventricular Rate:  90 PR Interval:  149 QRS Duration: 82 QT Interval:  331 QTC Calculation: 405 R Axis:   69 Text Interpretation:  Sinus rhythm Normal ECG Confirmed by Christy Gentles  MD,  Elenore Rota ([id number]) on 09/29/2014 10:30:57 AM      MDM   Final diagnoses:  Cough  Cavitary pneumonia    Nursing notes including past medical history and social history reviewed and considered in documentation xrays/imaging reviewed by myself and considered during evaluation Labs/vital reviewed myself and considered during evaluation     Sharyon Cable, MD 09/29/14 1409

## 2014-09-29 NOTE — Progress Notes (Signed)
ANTIBIOTIC CONSULT NOTE - INITIAL  Pharmacy Consult for Vancomycin / Unasyn Indication: Cavitary PNA  No Known Allergies  Patient Measurements: Height: 5\' 5"  (165.1 cm) Weight: 225 lb (102.059 kg) IBW/kg (Calculated) : 61.5 Adjusted Body Weight:   Vital Signs: Temp: 97.6 F (36.4 C) (12/18 1535) Temp Source: Oral (12/18 1535) BP: 151/104 mmHg (12/18 1535) Pulse Rate: 90 (12/18 1535) Intake/Output from previous day:   Intake/Output from this shift:    Labs:  Recent Labs  09/29/14 1018 09/29/14 1025  WBC 12.3*  --   HGB 13.0 14.6  PLT 418*  --   CREATININE  --  1.20   Estimated Creatinine Clearance: 59.4 mL/min (by C-G formula based on Cr of 1.2). No results for input(s): VANCOTROUGH, VANCOPEAK, VANCORANDOM, GENTTROUGH, GENTPEAK, GENTRANDOM, TOBRATROUGH, TOBRAPEAK, TOBRARND, AMIKACINPEAK, AMIKACINTROU, AMIKACIN in the last 72 hours.   Microbiology: No results found for this or any previous visit (from the past 720 hour(s)).  Medical History: Past Medical History  Diagnosis Date  . Hypertension   . Depression   . Hypercholesteremia   . COPD (chronic obstructive pulmonary disease)   . Cavitary pneumonia 09/29/2014  . Esophageal thickening 09/29/2014   Assessment: 31 yoM with PMHx HTN, HLD, and COPD (former smoker -2 PPD x 50 years) presents with 2 weeks of worsening cough, found to have cavitary PNA on CT.  Pharmacy consulted to start unasyn and vancomycin.   12/18 >> Zosyn x 1 12/18 >> Vancomycin  >> 12/18 >> Unasyn  >>    Tmax: Afebrile WBCs: 12.3K Renal: SCr 1.20, CrCl 59 ml/min CG (N55)  12/18 blood: in process 12/18 sputum: ordered 12/18 legionella/strep urinary antigen: ordered   Goal of Therapy:  Vancomycin trough level 15-20 mcg/ml  Eradication of infection  Plan:  Vancomycin 2g IV x 1 now, then 1250mg  IV q24h Unasyn 3g IV q6h F/u renal fxn, clinical course  Ralene Bathe, PharmD, BCPS 09/29/2014, 4:24 PM  Pager: [phone]

## 2014-09-29 NOTE — Progress Notes (Signed)
Pt arrived in room 1519 at 1530.  Pt denies any pain and is in no sign of distress.  Vital Signs WNL. Irven Baltimore, RN

## 2014-09-29 NOTE — ED Notes (Signed)
Patient reports that he has had a productive cough x 2 weeks. Patient states he went to a [facility]" and was prescribed Benzonatate. Patient states he has been coughing up mainly clear phlegm. Patient states the cough is worse at night. Patient denies any fever or sore throat.

## 2014-09-30 ENCOUNTER — Inpatient Hospital Stay (HOSPITAL_COMMUNITY): Payer: Medicare Other

## 2014-09-30 ENCOUNTER — Encounter (HOSPITAL_COMMUNITY): Payer: Self-pay | Admitting: Internal Medicine

## 2014-09-30 DIAGNOSIS — K224 Dyskinesia of esophagus: Secondary | ICD-10-CM

## 2014-09-30 DIAGNOSIS — K449 Diaphragmatic hernia without obstruction or gangrene: Secondary | ICD-10-CM

## 2014-09-30 HISTORY — DX: Diaphragmatic hernia without obstruction or gangrene: K44.9

## 2014-09-30 HISTORY — DX: Dyskinesia of esophagus: K22.4

## 2014-09-30 LAB — STREP PNEUMONIAE URINARY ANTIGEN: Strep Pneumo Urinary Antigen: NEGATIVE

## 2014-09-30 LAB — MRSA PCR SCREENING: MRSA by PCR: NEGATIVE

## 2014-09-30 MED ORDER — HYDROCOD POLST-CHLORPHEN POLST 10-8 MG/5ML PO LQCR: 5.0000 mL | Freq: Every day | ORAL | Status: DC

## 2014-09-30 MED ORDER — SULFAMETHOXAZOLE-TRIMETHOPRIM 800-160 MG PO TABS
1.0000 | ORAL_TABLET | Freq: Two times a day (BID) | ORAL | Status: DC
Start: 2014-09-30 — End: 2014-11-24

## 2014-09-30 MED ORDER — AMOXICILLIN-POT CLAVULANATE 875-125 MG PO TABS: 1.0000 | ORAL_TABLET | Freq: Two times a day (BID) | ORAL | Status: DC

## 2014-09-30 NOTE — Progress Notes (Signed)
Reviewed chart and spoke with MD. In light of question of esophageal dysmotility on CXR, recommend initiating evaluation with esophagram to assess entire esophageal phase of swallow. SLP will then f/u with bedside exam and MBS as needed to assess for aspiration risk pending results.

## 2014-09-30 NOTE — Discharge Instructions (Signed)

## 2014-09-30 NOTE — Discharge Summary (Signed)
Physician Discharge Summary  Mark Robertson VCB:[id number] DOB: [birth year] DOA: 09/29/2014  PCP: Horatio Pel, MD  Admit date: 09/29/2014 Discharge date: 09/30/2014   Recommendations for Outpatient Follow-Up:   1. The patient will need an outpatient CXR in 4-6 weeks to ensure resolution of his PNA.  If his symptoms do not resolved, MAC will need to be ruled out. 2. Follow-up sputum culture, blood cultures, strep pneumonia/Legionella antigen studies which are pending at the time of discharge.   Discharge Diagnosis:   Principal Problem:    Cavitary pneumonia Active Problems:    COPD (chronic obstructive pulmonary disease)    Esophageal thickening    Essential hypertension    Hyperlipidemia    Cough    Esophageal dysmotility   Discharge Condition: Improved.  Diet recommendation: Low sodium, heart healthy.     History of Present Illness:   Mark Robertson is an 73 y.o. male with a PMH of COPD, prior tobacco abuse (quit 12 years ago, 2 ppd habit), who was admitted 09/28/14 with a 2 week history of nocturnal cough and loss of appetite. Upon initial evaluation in the ER, the patient was afebrile with a WBC of 12.3. Chest x-ray and CT scan were done which showed findings concerning for a cavitary pneumonia/possible early lung abscess.   Hospital Course by Problem:   Principal Problem:  Cavitary pneumonia  Treated with Unasyn and vancomycin. Case discussed with Dr. Linus Salmons of ID who recommended a 3-4 week course of therapy (switched to Augmentin/Bactrim at D/C). If he does not improve, will need to R/O MAC.   Sputum culture, blood cultures, strep and legionella urinary antigens pending. HIV negative.  Will need a F/U CXR in 4-6 weeks to ensure resolution.  Patient felt well and requested early discharge on 09/30/14. No compelling reason to keep in the hospital.  Active Problems:  COPD (chronic obstructive pulmonary disease)  Continue Flonase and  usual home medications.   Esophageal thickening / esophageal dysmotility  Given significant smoking history, there was concern for esophageal cancer.   Barium esophagram negative but did show esophageal dysmotility. Can consider trial of Reglan if he has significant symptoms related to this.   Essential hypertension  Continue Cozaar.   Hyperlipidemia  Continue Crestor.   Cough  Tussionex 5 mL daily at bedtime ordered.   Medical Consultants:    Telephone consultation made with Dr. Scharlene Gloss, ID.   Discharge Exam:   Filed Vitals:   09/30/14 1335  BP: 141/65  Pulse: 74  Temp: 97.8 F (36.6 C)  Resp: 18   Filed Vitals:   09/29/14 1947 09/29/14 2125 09/30/14 0606 09/30/14 1335  BP:  133/63 134/75 141/65  Pulse:  73 92 74  Temp:  97.4 F (36.3 C) 97.6 F (36.4 C) 97.8 F (36.6 C)  TempSrc:  Oral Oral Oral  Resp:  18 18 18   Height:      Weight:      SpO2: 92% 95% 90% 100%    Gen:  NAD Cardiovascular:  RRR, No M/R/G Respiratory: Lungs CTAB Gastrointestinal: Abdomen soft, NT/ND with normal active bowel sounds. Extremities: No C/E/C   The results of significant diagnostics from this hospitalization (including imaging, microbiology, ancillary and laboratory) are listed below for reference.     Procedures and Diagnostic Studies:   Dg Chest 2 View 09/29/2014: Changes suggestive of a right upper lobe mass with peripheral consolidation. CT of the chest with contrast is recommended for further evaluation. These results will be called to  the ordering clinician or representative by the Radiologist Assistant, and communication documented in the PACS or zVision Dashboard.   Ct Chest W Contrast 09/29/2014: 1. Right upper lobe consolidation with central low-density most consistent with cavitary pneumonia in this clinical context. Findings may reflect an early lung abscess. Cavitary neoplasm is felt to be unlikely. Radiographic follow up after appropriate  antibiotic therapy recommended. 2. No significant pleural effusion or empyema. 3. Esophageal wall thickening and mild dilatation suggesting esophageal dysmotility. 4. Mild reactive adenopathy in the right hilum and mediastinum. Evidence of prior granulomatous disease.   Dg Esophagus 09/30/2014: 1. Esophageal dysmotility with significant tertiary contractions and barium stasis. 2. Minimal transient sliding hiatal hernia. No other abnormalities. No esophageal mass or stricture. No evidence of esophagitis.      Labs:   Basic Metabolic Panel:  Recent Labs Lab 09/29/14 1018 09/29/14 1025  NA  --  136*  K  --  4.5  CL  --  96  GLUCOSE  --  115*  BUN  --  15  CREATININE 1.08 1.20   GFR Estimated Creatinine Clearance: 59.4 mL/min (by C-G formula based on Cr of 1.2). Liver Function Tests:  Recent Labs Lab 09/29/14 1018  AST 26  ALT 36  ALKPHOS 99  BILITOT 0.3  PROT 7.3  ALBUMIN 3.1*   CBC:  Recent Labs Lab 09/29/14 1018 09/29/14 1025  WBC 12.3*  --   NEUTROABS 9.3*  --   HGB 13.0 14.6  HCT 41.9 43.0  MCV 96.8  --   PLT 418*  --    Microbiology Recent Results (from the past 240 hour(s))  Culture, blood (routine x 2)     Status: None (Preliminary result)   Collection Time: 09/29/14  1:40 PM  Result Value Ref Range Status   Specimen Description BLOOD RIGHT ANTECUBITAL  Final   Special Requests BOTTLES DRAWN AEROBIC AND ANAEROBIC 5ML  Final   Culture  Setup Time   Final    09/29/2014 21:10 Performed at [facility]    Culture   Final           BLOOD CULTURE RECEIVED NO GROWTH TO DATE CULTURE WILL BE HELD FOR 5 DAYS BEFORE ISSUING A FINAL NEGATIVE REPORT Performed at [facility]    Report Status PENDING  Incomplete  Culture, blood (routine x 2)     Status: None (Preliminary result)   Collection Time: 09/29/14  1:45 PM  Result Value Ref Range Status   Specimen Description BLOOD LEFT ANTECUBITAL  Final   Special Requests BOTTLES DRAWN AEROBIC AND  ANAEROBIC 5ML  Final   Culture  Setup Time   Final    09/29/2014 21:11 Performed at [facility]    Culture   Final           BLOOD CULTURE RECEIVED NO GROWTH TO DATE CULTURE WILL BE HELD FOR 5 DAYS BEFORE ISSUING A FINAL NEGATIVE REPORT Performed at [facility]    Report Status PENDING  Incomplete  MRSA PCR Screening     Status: None   Collection Time: 09/30/14  6:36 AM  Result Value Ref Range Status   MRSA by PCR NEGATIVE NEGATIVE Final    Comment:        The GeneXpert MRSA Assay (FDA approved for NASAL specimens only), is one component of a comprehensive MRSA colonization surveillance program. It is not intended to diagnose MRSA infection nor to guide or monitor treatment for MRSA infections.  Discharge Instructions:   Discharge Instructions    Call MD for:  difficulty breathing, headache or visual disturbances    Complete by:  As directed      Call MD for:  extreme fatigue    Complete by:  As directed      Call MD for:  temperature >100.4    Complete by:  As directed      Diet - low sodium heart healthy    Complete by:  As directed      Discharge instructions    Complete by:  As directed   You were treated for pneumonia while you were in the hospital.  It is important that you see your PCP in follow up, and have him/her order a follow up chest x-ray in 4-6 weeks to ensure resolution of the pneumonia and to exclude any underlying pathology.  Take all of your antibiotics, as prescribed, even if you feel better.  Do not discontinue antibiotics prematurely.     Increase activity slowly    Complete by:  As directed             Medication List    TAKE these medications        ALKA-SELTZER PLUS COLD & FLU 10-12.5-20-650 MG Pack  Generic drug:  Phenyleph-Doxylamine-DM-APAP  Take 1 packet by mouth every 6 (six) hours as needed (for cold).     amoxicillin-clavulanate 875-125 MG per tablet  Commonly known as:  AUGMENTIN  Take 1 tablet by mouth  2 (two) times daily.     aspirin EC 81 MG tablet  Take 81 mg by mouth every evening.     benzonatate 100 MG capsule  Commonly known as:  TESSALON  Take 100 mg by mouth 3 (three) times daily as needed for cough.     CENTRUM SILVER tablet  Take 1 tablet by mouth daily with breakfast.     CHERRY COUGH DROPS 6.1 MG Lozg  Use as directed 1 lozenge in the mouth or throat as needed (for cough).     chlorpheniramine-HYDROcodone 10-8 MG/5ML Lqcr  Commonly known as:  TUSSIONEX  Take 5 mLs by mouth at bedtime.     cholecalciferol 1000 UNITS tablet  Commonly known as:  VITAMIN D  Take 2,000 Units by mouth daily.     CLEAR EYES COMPLETE Soln  Place 1-2 drops into both eyes 2 (two) times daily as needed (for dry eyes).     cyclobenzaprine 10 MG tablet  Commonly known as:  FLEXERIL  Take 5 mg by mouth at bedtime as needed for muscle spasms.     flunisolide 25 MCG/ACT (0.025%) Soln  Commonly known as:  NASALIDE  Inhale 1 spray into the lungs daily as needed (for allergies).     Fluticasone Furoate-Vilanterol 100-25 MCG/INH Aepb  Commonly known as:  BREO ELLIPTA  Inhale 1 puff into the lungs daily.     losartan 50 MG tablet  Commonly known as:  COZAAR  Take 50 mg by mouth daily with breakfast.     MUCINEX CONGEST & COUGH CHILD 2.5-5-100 MG/5ML Liqd  Generic drug:  Phenylephrine-DM-GG  Take 30 mLs by mouth every 4 (four) hours as needed (for cold and cough).     naproxen sodium 220 MG tablet  Commonly known as:  ANAPROX  Take 220-440 mg by mouth daily as needed (for pain). Takes 440mg  in the morning and may take another 220mg  at night if needed     rosuvastatin 10 MG tablet  Commonly  known as:  CRESTOR  Take 10 mg by mouth every evening.     sulfamethoxazole-trimethoprim 800-160 MG per tablet  Commonly known as:  BACTRIM DS,SEPTRA DS  Take 1 tablet by mouth 2 (two) times daily.           Follow-up Information    Follow up with Horatio Pel, MD. Schedule an  appointment as soon as possible for a visit in 3 weeks.   Specialty:  Internal Medicine   Why:  Hospital follow up   Contact information:   [address] [place] [address] [phone]        Time coordinating discharge: 35 minutes.  Signed:  RAMA,CHRISTINA  Pager [phone] [facility] 09/30/2014, 4:13 PM

## 2014-09-30 NOTE — Evaluation (Signed)
Clinical/Bedside Swallow Evaluation Patient Details  Name: Mark Robertson MRN: [id number] Date of Birth: Sep 10, 1941  Today's Date: 09/30/2014 Time: 1500-1512 SLP Time Calculation (min) (ACUTE ONLY): 12 min  Past Medical History:  Past Medical History  Diagnosis Date  . Hypertension   . Depression   . Hypercholesteremia   . COPD (chronic obstructive pulmonary disease)   . Cavitary pneumonia 09/29/2014  . Esophageal thickening 09/29/2014   Past Surgical History:  Past Surgical History  Procedure Laterality Date  . Abdominal hernia repair    . Joint replacement      bilat knee  . Back surgery     HPI:  73 year old male with PMH of COPD, tobacco use, HTN, depression, admitted with cavitary PNA. Chest x-ray and CT scan were done which showed findings concerning for a cavitary pneumonia/possible early lung abscess as well as esophageal wall thickening and mild dilation suggesting esophageal dysmoitlity. Esophagram indicated Esophageal dysmotility with significant tertiary contractions and barium stasis, Minimal transient sliding hiatal hernia. No other abnormalities.   Assessment / Plan / Recommendation Clinical Impression  Patient presents with a functional oropharyngeal swallow without overt evidence of aspiration. Although risk of silent aspiration possible with h/o COPD, patient appears to be protecting his airway at this time. Suspect that c/o occassional globus are related to h/o GER and noted esophageal dsymotility on today's barium swallow.  Educated patient regarding general safe swallowing precautions as well as esophageal precautions which he is currently utilizing independently. No f/u SLP needs indicated at this time.     Aspiration Risk  Mild    Diet Recommendation Regular;Thin liquid   Liquid Administration via: Cup;Straw Medication Administration: Whole meds with liquid Supervision: Patient able to self feed Compensations: Slow rate;Small sips/bites;Follow solids  with liquid Postural Changes and/or Swallow Maneuvers: Seated upright 90 degrees;Upright 30-60 min after meal    Other  Recommendations Oral Care Recommendations: Oral care BID   Follow Up Recommendations  None       Pertinent Vitals/Pain n/a     Swallow Study    General HPI: 73 year old male with PMH of COPD, tobacco use, HTN, depression, admitted with cavitary PNA. Chest x-ray and CT scan were done which showed findings concerning for a cavitary pneumonia/possible early lung abscess as well as esophageal wall thickening and mild dilation suggesting esophageal dysmoitlity. Esophagram indicated Esophageal dysmotility with significant tertiary contractions and barium stasis, Minimal transient sliding hiatal hernia. No other abnormalities. Type of Study: Bedside swallow evaluation Previous Swallow Assessment: none Diet Prior to this Study: Regular;Thin liquids Temperature Spikes Noted: No Respiratory Status: Room air History of Recent Intubation: No Behavior/Cognition: Alert Oral Cavity - Dentition: Adequate natural dentition Self-Feeding Abilities: Able to feed self Patient Positioning: Upright in chair Baseline Vocal Quality: Clear Volitional Cough: Strong Volitional Swallow: Able to elicit    Oral/Motor/Sensory Function Overall Oral Motor/Sensory Function: Appears within functional limits for tasks assessed   Ice Chips Ice chips: Not tested   Thin Liquid Thin Liquid: Within functional limits Presentation: Cup;Self Fed;Straw    Nectar Thick Nectar Thick Liquid: Not tested   Honey Thick Honey Thick Liquid: Not tested   Puree Puree: Within functional limits Presentation: Spoon;Self Fed   Solid   GO   Contrell Ballentine MA, CCC-SLP [phone]  Solid: Within functional limits Presentation: [place] 09/30/2014,3:13 PM

## 2014-10-03 LAB — LEGIONELLA ANTIGEN, URINE

## 2014-10-05 LAB — CULTURE, BLOOD (ROUTINE X 2)
Culture: NO GROWTH
Culture: NO GROWTH

## 2014-10-16 DIAGNOSIS — J189 Pneumonia, unspecified organism: Secondary | ICD-10-CM | POA: Diagnosis not present

## 2014-10-16 DIAGNOSIS — R630 Anorexia: Secondary | ICD-10-CM | POA: Diagnosis not present

## 2014-10-16 DIAGNOSIS — E871 Hypo-osmolality and hyponatremia: Secondary | ICD-10-CM | POA: Diagnosis not present

## 2014-10-23 ENCOUNTER — Emergency Department (HOSPITAL_COMMUNITY): Payer: Medicare Other

## 2014-10-23 ENCOUNTER — Encounter (HOSPITAL_COMMUNITY): Payer: Self-pay | Admitting: Emergency Medicine

## 2014-10-23 ENCOUNTER — Emergency Department (HOSPITAL_COMMUNITY)
Admission: EM | Admit: 2014-10-23 | Discharge: 2014-10-23 | Disposition: A | Discharge status: Home / Self Care | Payer: Medicare Other | Attending: Emergency Medicine | Admitting: Emergency Medicine

## 2014-10-23 DIAGNOSIS — Z7951 Long term (current) use of inhaled steroids: Secondary | ICD-10-CM | POA: Insufficient documentation

## 2014-10-23 DIAGNOSIS — R7989 Other specified abnormal findings of blood chemistry: Secondary | ICD-10-CM | POA: Diagnosis not present

## 2014-10-23 DIAGNOSIS — J449 Chronic obstructive pulmonary disease, unspecified: Secondary | ICD-10-CM | POA: Insufficient documentation

## 2014-10-23 DIAGNOSIS — Z87891 Personal history of nicotine dependence: Secondary | ICD-10-CM | POA: Diagnosis not present

## 2014-10-23 DIAGNOSIS — Z8719 Personal history of other diseases of the digestive system: Secondary | ICD-10-CM | POA: Diagnosis not present

## 2014-10-23 DIAGNOSIS — Z8701 Personal history of pneumonia (recurrent): Secondary | ICD-10-CM | POA: Insufficient documentation

## 2014-10-23 DIAGNOSIS — R748 Abnormal levels of other serum enzymes: Secondary | ICD-10-CM | POA: Diagnosis not present

## 2014-10-23 DIAGNOSIS — Z792 Long term (current) use of antibiotics: Secondary | ICD-10-CM | POA: Diagnosis not present

## 2014-10-23 DIAGNOSIS — R630 Anorexia: Secondary | ICD-10-CM | POA: Diagnosis not present

## 2014-10-23 DIAGNOSIS — I10 Essential (primary) hypertension: Secondary | ICD-10-CM | POA: Insufficient documentation

## 2014-10-23 DIAGNOSIS — Z7982 Long term (current) use of aspirin: Secondary | ICD-10-CM | POA: Diagnosis not present

## 2014-10-23 DIAGNOSIS — R918 Other nonspecific abnormal finding of lung field: Secondary | ICD-10-CM | POA: Diagnosis not present

## 2014-10-23 DIAGNOSIS — Z79899 Other long term (current) drug therapy: Secondary | ICD-10-CM | POA: Diagnosis not present

## 2014-10-23 DIAGNOSIS — R945 Abnormal results of liver function studies: Secondary | ICD-10-CM | POA: Diagnosis not present

## 2014-10-23 LAB — COMPREHENSIVE METABOLIC PANEL
ALK PHOS: 708 U/L — AB (ref 39–117)
ALT: 282 U/L — ABNORMAL HIGH (ref 0–53)
AST: 118 U/L — AB (ref 0–37)
Albumin: 3.5 g/dL (ref 3.5–5.2)
Anion gap: 10 (ref 5–15)
BILIRUBIN TOTAL: 2.9 mg/dL — AB (ref 0.3–1.2)
BUN: 10 mg/dL (ref 6–23)
CALCIUM: 10.4 mg/dL (ref 8.4–10.5)
CO2: 29 mmol/L (ref 19–32)
CREATININE: 1.07 mg/dL (ref 0.50–1.35)
Chloride: 96 mEq/L (ref 96–112)
GFR calc Af Amer: 77 mL/min — ABNORMAL LOW (ref >90)
GFR calc non Af Amer: 66 mL/min — ABNORMAL LOW (ref >90)
Glucose, Bld: 123 mg/dL — ABNORMAL HIGH (ref 70–99)
POTASSIUM: 5 mmol/L (ref 3.5–5.1)
SODIUM: 135 mmol/L (ref 135–145)
TOTAL PROTEIN: 7.7 g/dL (ref 6.0–8.3)

## 2014-10-23 LAB — CBC WITH DIFFERENTIAL/PLATELET
Basophils Absolute: 0 10*3/uL (ref 0.0–0.1)
Basophils Relative: 0 % (ref 0–1)
Eosinophils Absolute: 0.7 10*3/uL (ref 0.0–0.7)
Eosinophils Relative: 10 % — ABNORMAL HIGH (ref 0–5)
HCT: 42.5 % (ref 39.0–52.0)
HEMOGLOBIN: 14.1 g/dL (ref 13.0–17.0)
Lymphocytes Relative: 15 % (ref 12–46)
Lymphs Abs: 1.1 10*3/uL (ref 0.7–4.0)
MCH: 30.3 pg (ref 26.0–34.0)
MCHC: 33.2 g/dL (ref 30.0–36.0)
MCV: 91.2 fL (ref 78.0–100.0)
MONO ABS: 0.9 10*3/uL (ref 0.1–1.0)
Monocytes Relative: 13 % — ABNORMAL HIGH (ref 3–12)
Neutro Abs: 4.3 10*3/uL (ref 1.7–7.7)
Neutrophils Relative %: 62 % (ref 43–77)
Platelets: 189 10*3/uL (ref 150–400)
RBC: 4.66 MIL/uL (ref 4.22–5.81)
RDW: 14.5 % (ref 11.5–15.5)
WBC: 7 10*3/uL (ref 4.0–10.5)

## 2014-10-23 LAB — T4, FREE: Free T4: 1.48 ng/dL (ref 0.80–1.80)

## 2014-10-23 LAB — TSH: TSH: 3.537 u[IU]/mL (ref 0.350–4.500)

## 2014-10-23 MED ORDER — SODIUM CHLORIDE 0.9 % IV BOLUS (SEPSIS)
500.0000 mL | Freq: Once | INTRAVENOUS | Status: AC
  Administered 2014-10-23: 500 mL via INTRAVENOUS

## 2014-10-23 NOTE — ED Notes (Signed)
Pt c/o loss of appetite over the past 7 weeks.  Pt states that 3 weeks ago he had PNA. Pt states that he saw his PCP last Thursday and told him that he had loss of appetite and states doctor told him he was still sick and to contact him again if he gets worse.  Pt states, "no need to go see him again so i came here".  Pt states that he can get a plate of food and taste it but get no feeling of wanting to eat it.  Pt lost about 8-9 pounds in a month.  Pt states that he started on Brio 5-6 months ago and thinks that's what causing his lung problem.

## 2014-10-23 NOTE — ED Provider Notes (Signed)
CSN: [id number]     Arrival date & time 10/23/14  0930 History   First MD Initiated Contact with Patient 10/23/14 1013     Chief Complaint  Patient presents with  . Anorexia     (Consider location/radiation/quality/duration/timing/severity/associated sxs/prior Treatment) HPI Comments:  74 y.o. male w hx of HTN, COPD, recent admission for cavitary pna who presents with lack of appetite. He states he has had a lack of interest in food for the last 6 weeks. He notes that he ate two full meals yesterday but was not interested in the food. He saw his PCP last week he states asked him to monitor his symptoms and return for any worsening. He states that he finished his antibiotics for the pneumonia last Friday. He denies any chest pain, sob, fevers. His symptoms are constant. The severity is mild. He has not had similar symptoms previously. He denies any other associated symptoms. Nothing has relieved his symptoms thus far.  The history is provided by the patient.    Past Medical History  Diagnosis Date  . Hypertension   . Depression   . Hypercholesteremia   . COPD (chronic obstructive pulmonary disease)   . Cavitary pneumonia 09/29/2014  . Esophageal thickening 09/29/2014  . Esophageal dysmotility 09/30/2014  . Hiatal hernia 09/30/2014   Past Surgical History  Procedure Laterality Date  . Abdominal hernia repair    . Joint replacement      bilat knee  . Back surgery     Family History  Problem Relation Age of Onset  . Stroke Father   . Stroke Sister   . Heart failure Brother    History  Substance Use Topics  . Smoking status: Former Smoker -- 2.00 packs/day for 50 years    Types: Cigarettes    Quit date: 10/13/2001  . Smokeless tobacco: Not on file  . Alcohol Use: Yes     Comment: occasionally    Review of Systems  Constitutional: Positive for appetite change. Negative for fever.  HENT: Negative for drooling and rhinorrhea.   Eyes: Negative for pain.  Respiratory:  Negative for cough and shortness of breath.   Cardiovascular: Negative for chest pain and leg swelling.  Gastrointestinal: Negative for nausea, vomiting, abdominal pain and diarrhea.  Genitourinary: Negative for dysuria and hematuria.  Musculoskeletal: Negative for gait problem and neck pain.  Skin: Negative for color change.  Neurological: Negative for numbness and headaches.  Hematological: Negative for adenopathy.  Psychiatric/Behavioral: Negative for behavioral problems.  All other systems reviewed and are negative.     Allergies  Review of patient's allergies indicates no known allergies.  Home Medications   Prior to Admission medications   Medication Sig Start Date End Date Taking? Authorizing Provider  amoxicillin-clavulanate (AUGMENTIN) 875-125 MG per tablet Take 1 tablet by mouth 2 (two) times daily. 09/30/14   Venetia Maxon Rama, MD  aspirin EC 81 MG tablet Take 81 mg by mouth every evening.    Historical Provider, MD  benzonatate (TESSALON) 100 MG capsule Take 100 mg by mouth 3 (three) times daily as needed for cough.    Historical Provider, MD  chlorpheniramine-HYDROcodone (TUSSIONEX) 10-8 MG/5ML LQCR Take 5 mLs by mouth at bedtime. 09/30/14   Venetia Maxon Rama, MD  cholecalciferol (VITAMIN D) 1000 UNITS tablet Take 2,000 Units by mouth daily.      Historical Provider, MD  cyclobenzaprine (FLEXERIL) 10 MG tablet Take 5 mg by mouth at bedtime as needed for muscle spasms.    Historical Provider,  MD  flunisolide (NASALIDE) 0.025 % SOLN Inhale 1 spray into the lungs daily as needed (for allergies).     Historical Provider, MD  Fluticasone Furoate-Vilanterol (BREO ELLIPTA) 100-25 MCG/INH AEPB Inhale 1 puff into the lungs daily. 02/14/14   Rigoberto Noel, MD  Hyprom-Naphaz-Polysorb-Zn Sulf (CLEAR EYES COMPLETE) SOLN Place 1-2 drops into both eyes 2 (two) times daily as needed (for dry eyes).    Historical Provider, MD  losartan (COZAAR) 50 MG tablet Take 50 mg by mouth daily with  breakfast.     Historical Provider, MD  Multiple Vitamins-Minerals (CENTRUM SILVER) tablet Take 1 tablet by mouth daily with breakfast.    Historical Provider, MD  naproxen sodium (ANAPROX) 220 MG tablet Take 220-440 mg by mouth daily as needed (for pain). Takes 440mg  in the morning and may take another 220mg  at night if needed    Historical Provider, MD  Phenyleph-Doxylamine-DM-APAP (ALKA-SELTZER PLUS COLD & FLU) 10-12.5-20-650 MG PACK Take 1 packet by mouth every 6 (six) hours as needed (for cold).    Historical Provider, MD  Phenylephrine-DM-GG ([place]) 2.5-5-100 MG/5ML LIQD Take 30 mLs by mouth every 4 (four) hours as needed (for cold and cough).    Historical Provider, MD  rosuvastatin (CRESTOR) 10 MG tablet Take 10 mg by mouth every evening.    Historical Provider, MD  sulfamethoxazole-trimethoprim (BACTRIM DS,SEPTRA DS) 800-160 MG per tablet Take 1 tablet by mouth 2 (two) times daily. 09/30/14   Venetia Maxon Rama, MD  Throat Lozenges (CHERRY COUGH DROPS) 6.1 MG LOZG Use as directed 1 lozenge in the mouth or throat as needed (for cough).    Historical Provider, MD   BP 163/88 mmHg  Pulse 111  Temp(Src) 97.6 F (36.4 C) (Oral)  Resp 16  Wt 217 lb 6 oz (98.601 kg)  SpO2 93% Physical Exam  Constitutional: He is oriented to person, place, and time. He appears well-developed and well-nourished.  HENT:  Head: Normocephalic and atraumatic.  Right Ear: External ear normal.  Left Ear: External ear normal.  Nose: Nose normal.  Mouth/Throat: Oropharynx is clear and moist. No oropharyngeal exudate.  Eyes: Conjunctivae and EOM are normal. Pupils are equal, round, and reactive to light.  Neck: Normal range of motion. Neck supple.  Cardiovascular: Normal rate, regular rhythm, normal heart sounds and intact distal pulses.  Exam reveals no gallop and no friction rub.   No murmur heard. Pulmonary/Chest: Effort normal and breath sounds normal. No respiratory distress. He has no  wheezes.  Abdominal: Soft. Bowel sounds are normal. He exhibits no distension. There is no tenderness. There is no rebound and no guarding.  Musculoskeletal: Normal range of motion. He exhibits no edema or tenderness.  Neurological: He is alert and oriented to person, place, and time.  Skin: Skin is warm and dry.  Psychiatric: He has a normal mood and affect. His behavior is normal.  Nursing note and vitals reviewed.   ED Course  Procedures (including critical care time) Labs Review Labs Reviewed  CBC WITH DIFFERENTIAL - Abnormal; Notable for the following:    Monocytes Relative 13 (*)    Eosinophils Relative 10 (*)    All other components within normal limits  COMPREHENSIVE METABOLIC PANEL - Abnormal; Notable for the following:    Glucose, Bld 123 (*)    AST 118 (*)    ALT 282 (*)    Alkaline Phosphatase 708 (*)    Total Bilirubin 2.9 (*)    GFR calc non Af  Amer 66 (*)    GFR calc Af Amer 77 (*)    All other components within normal limits  TSH  T4, FREE    Imaging Review Dg Chest 2 View  10/23/2014   CLINICAL DATA:  Weight loss, loss of appetite.  Recent pneumonia.  EXAM: CHEST  2 VIEW  COMPARISON:  09/29/2014  FINDINGS: Right upper lobe opacity as improved since prior study. No new airspace opacities or effusions. Heart is normal size. Mediastinal contours are within normal limits. No acute bony abnormality.  IMPRESSION: Improving right upper lobe consolidation.   Electronically Signed   By: Rolm Baptise M.D.   On: 10/23/2014 10:47   US Abdomen Limited Ruq  10/23/2014   CLINICAL DATA:  Elevated liver enzymes.  EXAM: US ABDOMEN LIMITED - RIGHT UPPER QUADRANT  COMPARISON:  None.  FINDINGS: Gallbladder:  No gallstones or wall thickening visualized. No sonographic Murphy sign noted.  Common bile duct:  Diameter: 0.7 cm  Liver:  No focal lesion identified. Within normal limits in parenchymal echogenicity.  IMPRESSION: Negative exam.   Electronically Signed   By: Inge Rise M.D.    On: 10/23/2014 13:47     EKG Interpretation None      MDM   Final diagnoses:  Lack of appetite  Elevated liver enzymes    10:40 AM 74 y.o. male w hx of HTN, COPD, recent admission for cavitary pna who presents with lack of appetite. He states he has had a lack of interest in food for the last 6 weeks. He notes that he ate to full meals yesterday but was not interested in the food. He saw his PCP last week he states asked him to monitor his symptoms and return for any worsening. He states that he finished his antibiotics for the pneumonia last Friday. He denies any chest pain, sob, fevers. He is afebrile and mildly tachycardic here. We'll get screening labs and chest x-ray.  Pt found to have elevated liver enzymes. Will get [place] of RUQ.   3:17 PM: [place] non-contrib. Pt cont to appear well. No N/V/D or abd pain. VSS. I discussed the case w/ the pt's pcp (Dr. Shelia Media). He will have GI review the labwork and arrange f/u. Do not think the pt needs admission given well appearance and lack of assoc sx.  I have discussed the diagnosis/risks/treatment options with the patient and believe the pt to be eligible for discharge home to follow-up with his pcp. We also discussed returning to the ED immediately if new or worsening sx occur. We discussed the sx which are most concerning (e.g., worsening pain, fever, vomiting) that necessitate immediate return. Medications administered to the patient during their visit and any new prescriptions provided to the patient are listed below.  Medications given during this visit Medications  sodium chloride 0.9 % bolus 500 mL (0 mLs Intravenous Stopped 10/23/14 1144)    New Prescriptions   No medications on file     Pamella Pert, MD 10/23/14 1551

## 2014-10-25 ENCOUNTER — Emergency Department (HOSPITAL_COMMUNITY)
Admission: EM | Admit: 2014-10-25 | Discharge: 2014-10-25 | Disposition: A | Discharge status: Home / Self Care | Payer: Medicare Other | Attending: Emergency Medicine | Admitting: Emergency Medicine

## 2014-10-25 DIAGNOSIS — L988 Other specified disorders of the skin and subcutaneous tissue: Secondary | ICD-10-CM | POA: Diagnosis not present

## 2014-10-25 DIAGNOSIS — Z79899 Other long term (current) drug therapy: Secondary | ICD-10-CM | POA: Diagnosis not present

## 2014-10-25 DIAGNOSIS — E780 Pure hypercholesterolemia: Secondary | ICD-10-CM | POA: Diagnosis not present

## 2014-10-25 DIAGNOSIS — Z8719 Personal history of other diseases of the digestive system: Secondary | ICD-10-CM | POA: Insufficient documentation

## 2014-10-25 DIAGNOSIS — Z8701 Personal history of pneumonia (recurrent): Secondary | ICD-10-CM | POA: Insufficient documentation

## 2014-10-25 DIAGNOSIS — Z87891 Personal history of nicotine dependence: Secondary | ICD-10-CM | POA: Diagnosis not present

## 2014-10-25 DIAGNOSIS — Z7951 Long term (current) use of inhaled steroids: Secondary | ICD-10-CM | POA: Insufficient documentation

## 2014-10-25 DIAGNOSIS — I10 Essential (primary) hypertension: Secondary | ICD-10-CM | POA: Diagnosis not present

## 2014-10-25 DIAGNOSIS — Z7982 Long term (current) use of aspirin: Secondary | ICD-10-CM | POA: Diagnosis not present

## 2014-10-25 DIAGNOSIS — K1379 Other lesions of oral mucosa: Secondary | ICD-10-CM | POA: Diagnosis present

## 2014-10-25 DIAGNOSIS — F329 Major depressive disorder, single episode, unspecified: Secondary | ICD-10-CM | POA: Diagnosis not present

## 2014-10-25 DIAGNOSIS — S00522A Blister (nonthermal) of oral cavity, initial encounter: Secondary | ICD-10-CM | POA: Diagnosis not present

## 2014-10-25 DIAGNOSIS — J449 Chronic obstructive pulmonary disease, unspecified: Secondary | ICD-10-CM | POA: Insufficient documentation

## 2014-10-25 MED ORDER — CHLORHEXIDINE GLUCONATE 0.12 % MT SOLN
15.0000 mL | Freq: Once | OROMUCOSAL | Status: AC
  Administered 2014-10-25: 15 mL via OROMUCOSAL
  Filled 2014-10-25: qty 15

## 2014-10-25 NOTE — ED Notes (Signed)
See paper charting for vital signs and assessment

## 2014-10-25 NOTE — Discharge Instructions (Signed)
Blisters Blisters are fluid-filled sacs that form within the skin. Common causes of blistering are friction, burns, and exposure to irritating chemicals. The fluid in the blister protects the underlying damaged skin. Most of the time it is not recommended that you open blisters. When a blister is opened, there is an increased chance for infection. Usually, a blister will open on its own. They then dry up and peel off within 10 days. If the blister is tense and uncomfortable (painful) the fluid may be drained. If it is drained the roof of the blister should be left intact. The draining should only be done by a medical professional under aseptic conditions. Poorly fitting shoes and boots can cause blisters by being too tight or too loose. Wearing extra socks or using tape, bandages, or pads over the blister-prone area helps prevent the problem by reducing friction. Blisters heal more slowly if you have diabetes or if you have problems with your circulation. You need to be careful about medical follow-up to prevent infection. HOME CARE INSTRUCTIONS  Protect areas where blisters have formed until the skin is healed. Use a special bandage with a hole cut in the middle around the blister. This reduces pressure and friction. When the blister breaks, trim off the loose skin and keep the area clean by washing it with soap daily. Soaking the blister or broken-open blister with diluted vinegar twice daily for 15 minutes will dry it up and speed the healing. Use 3 tablespoons of white vinegar per quart of water (45 mL white vinegar per liter of water). An antibiotic ointment and a bandage can be used to cover the area after soaking.  SEEK MEDICAL CARE IF:   You develop increased redness, pain, swelling, or drainage in the blistered area.  You develop a pus-like discharge from the blistered area, chills, or a fever. MAKE SURE YOU:   Understand these instructions.  Will watch your condition.  Will get help right  away if you are not doing well or get worse. Document Released: 11/06/2004 Document Revised: 12/22/2011 Document Reviewed: 10/04/2008 [facility] Patient Information 2015 [place], [place]. This information is not intended to replace advice given to you by your health care provider. Make sure you discuss any questions you have with your health care provider. You most likely bit the inside of your cheek now have a small blister  Use the chlorhexidine oral rinse BID for 3 days

## 2014-10-25 NOTE — ED Provider Notes (Signed)
CSN: [id number]     Arrival date & time 10/25/14  0402 History   First MD Initiated Contact with Patient 10/25/14 0500     No chief complaint on file.    (Consider location/radiation/quality/duration/timing/severity/associated sxs/prior Treatment) HPI Comments: Woke with blister inside R cheek does not remember trauma  States has had dry mouth since for a while   The history is provided by the patient.    Past Medical History  Diagnosis Date  . Hypertension   . Depression   . Hypercholesteremia   . COPD (chronic obstructive pulmonary disease)   . Cavitary pneumonia 09/29/2014  . Esophageal thickening 09/29/2014  . Esophageal dysmotility 09/30/2014  . Hiatal hernia 09/30/2014   Past Surgical History  Procedure Laterality Date  . Abdominal hernia repair    . Joint replacement      bilat knee  . Back surgery     Family History  Problem Relation Age of Onset  . Stroke Father   . Stroke Sister   . Heart failure Brother    History  Substance Use Topics  . Smoking status: Former Smoker -- 2.00 packs/day for 50 years    Types: Cigarettes    Quit date: 10/13/2001  . Smokeless tobacco: Not on file  . Alcohol Use: Yes     Comment: occasionally    Review of Systems  Constitutional: Negative for fever and chills.  HENT: Positive for mouth sores. Negative for dental problem.   Respiratory: Negative for cough.   Cardiovascular: Negative for chest pain.  Neurological: Negative for dizziness and headaches.  All other systems reviewed and are negative.     Allergies  Review of patient's allergies indicates no known allergies.  Home Medications   Prior to Admission medications   Medication Sig Start Date End Date Taking? Authorizing Provider  amoxicillin-clavulanate (AUGMENTIN) 875-125 MG per tablet Take 1 tablet by mouth 2 (two) times daily. Patient not taking: Reported on 10/23/2014 09/30/14   Venetia Maxon Rama, MD  aspirin EC 81 MG tablet Take 81 mg by mouth every  evening.    Historical Provider, MD  chlorpheniramine-HYDROcodone (TUSSIONEX) 10-8 MG/5ML LQCR Take 5 mLs by mouth at bedtime. Patient not taking: Reported on 10/23/2014 09/30/14   Venetia Maxon Rama, MD  Cholecalciferol (VITAMIN D) 2000 UNITS tablet Take 2,000 Units by mouth daily.    Historical Provider, MD  cyclobenzaprine (FLEXERIL) 10 MG tablet Take 5 mg by mouth at bedtime as needed for muscle spasms.    Historical Provider, MD  flunisolide (NASALIDE) 0.025 % SOLN Inhale 1 spray into the lungs daily as needed (for allergies).     Historical Provider, MD  Fluticasone Furoate-Vilanterol (BREO ELLIPTA) 100-25 MCG/INH AEPB Inhale 1 puff into the lungs daily. Patient not taking: Reported on 10/23/2014 02/14/14   Rigoberto Noel, MD  Hyprom-Naphaz-Polysorb-Zn Sulf (CLEAR EYES COMPLETE) SOLN Place 1-2 drops into both eyes 2 (two) times daily as needed (for dry eyes).    Historical Provider, MD  losartan (COZAAR) 50 MG tablet Take 50 mg by mouth daily with breakfast.     Historical Provider, MD  Multiple Vitamins-Minerals (CENTRUM SILVER) tablet Take 1 tablet by mouth daily with breakfast.    Historical Provider, MD  naproxen sodium (ANAPROX) 220 MG tablet Take 220-440 mg by mouth daily as needed (for pain). Takes 440mg  in the morning and may take another 220mg  at night if needed    Historical Provider, MD  ranitidine (ZANTAC) 150 MG tablet Take 150 mg by mouth daily.  Historical Provider, MD  rosuvastatin (CRESTOR) 10 MG tablet Take 10 mg by mouth every evening.    Historical Provider, MD  sulfamethoxazole-trimethoprim (BACTRIM DS,SEPTRA DS) 800-160 MG per tablet Take 1 tablet by mouth 2 (two) times daily. Patient not taking: Reported on 10/23/2014 09/30/14   Venetia Maxon Rama, MD  Throat Lozenges (CHERRY COUGH DROPS) 6.1 MG LOZG Use as directed 1 lozenge in the mouth or throat as needed (for cough).    Historical Provider, MD   There were no vitals taken for this visit. Physical Exam  Constitutional: He  is oriented to person, place, and time. He appears well-developed and well-nourished.  HENT:  Head: Normocephalic.  Right Ear: External ear normal.  Left Ear: External ear normal.  Blister 1 CM by .5CM r cheek  Where teeth meet  Most likely bit cheek at some time in past several hours  Eyes: Pupils are equal, round, and reactive to light.  Neck: Normal range of motion.  Pulmonary/Chest: Effort normal.  Abdominal: Soft.  Neurological: He is alert and oriented to person, place, and time.  Skin: Skin is warm.  Nursing note and vitals reviewed.   ED Course  Procedures (including critical care time) Labs Review Labs Reviewed - No data to display  Imaging Review Dg Chest 2 View  10/23/2014   CLINICAL DATA:  Weight loss, loss of appetite.  Recent pneumonia.  EXAM: CHEST  2 VIEW  COMPARISON:  09/29/2014  FINDINGS: Right upper lobe opacity as improved since prior study. No new airspace opacities or effusions. Heart is normal size. Mediastinal contours are within normal limits. No acute bony abnormality.  IMPRESSION: Improving right upper lobe consolidation.   Electronically Signed   By: Rolm Baptise M.D.   On: 10/23/2014 10:47   US Abdomen Limited Ruq  10/23/2014   CLINICAL DATA:  Elevated liver enzymes.  EXAM: US ABDOMEN LIMITED - RIGHT UPPER QUADRANT  COMPARISON:  None.  FINDINGS: Gallbladder:  No gallstones or wall thickening visualized. No sonographic Murphy sign noted.  Common bile duct:  Diameter: 0.7 cm  Liver:  No focal lesion identified. Within normal limits in parenchymal echogenicity.  IMPRESSION: Negative exam.   Electronically Signed   By: Inge Rise M.D.   On: 10/23/2014 13:47     EKG Interpretation None     Given chlorhexidine oral wash BID for 3 days  MDM   Final diagnoses:  None         Garald Balding, NP 10/25/14 6734  Kalman Drape, MD 10/25/14 [phone]

## 2014-10-26 DIAGNOSIS — R749 Abnormal serum enzyme level, unspecified: Secondary | ICD-10-CM | POA: Diagnosis not present

## 2014-10-27 ENCOUNTER — Other Ambulatory Visit: Payer: Self-pay | Admitting: Internal Medicine

## 2014-10-27 DIAGNOSIS — R748 Abnormal levels of other serum enzymes: Secondary | ICD-10-CM

## 2014-10-30 ENCOUNTER — Other Ambulatory Visit: Payer: Self-pay | Admitting: Gastroenterology

## 2014-10-30 DIAGNOSIS — R945 Abnormal results of liver function studies: Principal | ICD-10-CM

## 2014-10-30 DIAGNOSIS — R7989 Other specified abnormal findings of blood chemistry: Secondary | ICD-10-CM

## 2014-10-30 DIAGNOSIS — R740 Nonspecific elevation of levels of transaminase and lactic acid dehydrogenase [LDH]: Secondary | ICD-10-CM | POA: Diagnosis not present

## 2014-10-30 DIAGNOSIS — Z1211 Encounter for screening for malignant neoplasm of colon: Secondary | ICD-10-CM | POA: Diagnosis not present

## 2014-10-30 DIAGNOSIS — R1310 Dysphagia, unspecified: Secondary | ICD-10-CM | POA: Diagnosis not present

## 2014-10-31 ENCOUNTER — Other Ambulatory Visit: Payer: Medicare Other

## 2014-10-31 DIAGNOSIS — J449 Chronic obstructive pulmonary disease, unspecified: Secondary | ICD-10-CM | POA: Diagnosis not present

## 2014-11-03 ENCOUNTER — Other Ambulatory Visit: Payer: Medicare Other

## 2014-11-06 ENCOUNTER — Ambulatory Visit
Admission: RE | Admit: 2014-11-06 | Discharge: 2014-11-06 | Disposition: A | Discharge status: Home / Self Care | Payer: Medicare Other | Source: Ambulatory Visit | Attending: Gastroenterology | Admitting: Gastroenterology

## 2014-11-06 DIAGNOSIS — R945 Abnormal results of liver function studies: Principal | ICD-10-CM

## 2014-11-06 DIAGNOSIS — R7989 Other specified abnormal findings of blood chemistry: Secondary | ICD-10-CM

## 2014-11-07 DIAGNOSIS — J189 Pneumonia, unspecified organism: Secondary | ICD-10-CM | POA: Diagnosis not present

## 2014-11-07 DIAGNOSIS — R748 Abnormal levels of other serum enzymes: Secondary | ICD-10-CM | POA: Diagnosis not present

## 2014-11-07 DIAGNOSIS — J984 Other disorders of lung: Secondary | ICD-10-CM | POA: Diagnosis not present

## 2014-11-16 DIAGNOSIS — H2513 Age-related nuclear cataract, bilateral: Secondary | ICD-10-CM | POA: Diagnosis not present

## 2014-11-24 ENCOUNTER — Ambulatory Visit (INDEPENDENT_AMBULATORY_CARE_PROVIDER_SITE_OTHER): Payer: Medicare Other | Admitting: Pulmonary Disease

## 2014-11-24 ENCOUNTER — Ambulatory Visit (INDEPENDENT_AMBULATORY_CARE_PROVIDER_SITE_OTHER)
Admission: RE | Admit: 2014-11-24 | Discharge: 2014-11-24 | Disposition: A | Discharge status: Home / Self Care | Payer: Medicare Other | Source: Ambulatory Visit | Attending: Pulmonary Disease | Admitting: Pulmonary Disease

## 2014-11-24 ENCOUNTER — Encounter: Payer: Self-pay | Admitting: Pulmonary Disease

## 2014-11-24 VITALS — BP 160/98 | HR 92 | Ht 65.0 in | Wt 221.0 lb

## 2014-11-24 DIAGNOSIS — J690 Pneumonitis due to inhalation of food and vomit: Secondary | ICD-10-CM | POA: Diagnosis not present

## 2014-11-24 DIAGNOSIS — J449 Chronic obstructive pulmonary disease, unspecified: Secondary | ICD-10-CM

## 2014-11-24 DIAGNOSIS — J189 Pneumonia, unspecified organism: Secondary | ICD-10-CM

## 2014-11-24 DIAGNOSIS — J984 Other disorders of lung: Secondary | ICD-10-CM | POA: Diagnosis not present

## 2014-11-24 DIAGNOSIS — G4733 Obstructive sleep apnea (adult) (pediatric): Secondary | ICD-10-CM

## 2014-11-24 DIAGNOSIS — I10 Essential (primary) hypertension: Secondary | ICD-10-CM | POA: Diagnosis not present

## 2014-11-24 NOTE — Patient Instructions (Signed)
If Asmanex does not help you as much, OK to go back to [facility] Call as needed

## 2014-11-24 NOTE — Progress Notes (Signed)
   Subjective:    Patient ID: Mark Robertson, male    DOB: 23-Oct-1940, 74 y.o.   MRN: [id number]  HPI  74 year old ex-smoker for FU of COPD and obstructive sleep apnea. 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 He smoked 2 pack spur day until he quit in 2003 about 60 pack years  Initial OV 2007 (wert)after nocturnal oximetry showed  significant desaturation x 74 minutes. PFTs also showed moderate obstruction with minimal response to bronchodilators. It was felt that he would not comply with nocturnal oxygen.   11/2013 FEV1-1.02 (40%)--> post BD 1.42 (56%)  He used Spiriva for many years, then quit due to dry mouth.  He walks daily for 3 miles at his own pace and denies wheezing or frequent chest colds. He received Pneumovax in 2012 .  PSG 2007 - weighed 204 pounds showed mild obstructive sleep apnea with RDI of 12 events per hour and mild oxygen desaturation with a minimum of 79% for 25% of total sleep time. He does not want further sleep testing and is not interested in CPAP therapy.  11/24/2014   Chief Complaint  Patient presents with  . Follow-up    diagnosed with Pnuemonia.  Patient says he is feeling fine.     He states he never had symptoms of fever, cough, dyspnea CT chest 09/29/14 RUL cavitary consolidation CXR 10/23/14 improving RUL consolidation  Took abx, breo was changed to asmanex for ? Dry mouth, breo really helped him do more CXR 11/24/14 >> improved RUL ASD with residual scarring    Review of Systems neg for any significant sore throat, dysphagia, itching, sneezing, nasal congestion or excess/ purulent secretions, fever, chills, sweats, unintended wt loss, pleuritic or exertional cp, hempoptysis, orthopnea pnd or change in chronic leg swelling. Also denies presyncope, palpitations, heartburn, abdominal pain, nausea, vomiting, diarrhea or change in bowel or urinary habits, dysuria,hematuria, rash, arthralgias, visual complaints, headache, numbness weakness or ataxia.       Objective:   Physical Exam  Gen. Pleasant, obese, in no distress ENT - no lesions, no post nasal drip Neck: No JVD, no thyromegaly, no carotid bruits Lungs: no use of accessory muscles, no dullness to percussion, decreased without rales or rhonchi  Cardiovascular: Rhythm regular, heart sounds  normal, no murmurs or gallops, no peripheral edema Musculoskeletal: No deformities, no cyanosis or clubbing , no tremors       Assessment & Plan:

## 2014-11-25 NOTE — Assessment & Plan Note (Signed)
Resolving on CXR

## 2014-11-25 NOTE — Assessment & Plan Note (Signed)
If Asmanex does not help you as much, OK to go back to [facility] Call as needed

## 2014-12-05 DIAGNOSIS — R748 Abnormal levels of other serum enzymes: Secondary | ICD-10-CM | POA: Diagnosis not present

## 2014-12-18 DIAGNOSIS — R749 Abnormal serum enzyme level, unspecified: Secondary | ICD-10-CM | POA: Diagnosis not present

## 2014-12-21 DIAGNOSIS — Z1212 Encounter for screening for malignant neoplasm of rectum: Secondary | ICD-10-CM | POA: Diagnosis not present

## 2014-12-21 DIAGNOSIS — E871 Hypo-osmolality and hyponatremia: Secondary | ICD-10-CM | POA: Diagnosis not present

## 2014-12-21 DIAGNOSIS — J449 Chronic obstructive pulmonary disease, unspecified: Secondary | ICD-10-CM | POA: Diagnosis not present

## 2014-12-21 DIAGNOSIS — Z0000 Encounter for general adult medical examination without abnormal findings: Secondary | ICD-10-CM | POA: Diagnosis not present

## 2014-12-21 DIAGNOSIS — R748 Abnormal levels of other serum enzymes: Secondary | ICD-10-CM | POA: Diagnosis not present

## 2014-12-27 ENCOUNTER — Ambulatory Visit: Payer: Medicare Other | Admitting: Pulmonary Disease

## 2014-12-27 DIAGNOSIS — R740 Nonspecific elevation of levels of transaminase and lactic acid dehydrogenase [LDH]: Secondary | ICD-10-CM | POA: Diagnosis not present

## 2015-02-12 ENCOUNTER — Ambulatory Visit (INDEPENDENT_AMBULATORY_CARE_PROVIDER_SITE_OTHER): Payer: Medicare Other | Admitting: Pulmonary Disease

## 2015-02-12 ENCOUNTER — Ambulatory Visit (INDEPENDENT_AMBULATORY_CARE_PROVIDER_SITE_OTHER)
Admission: RE | Admit: 2015-02-12 | Discharge: 2015-02-12 | Disposition: A | Discharge status: Home / Self Care | Payer: Medicare Other | Source: Ambulatory Visit | Attending: Pulmonary Disease | Admitting: Pulmonary Disease

## 2015-02-12 ENCOUNTER — Encounter: Payer: Self-pay | Admitting: Pulmonary Disease

## 2015-02-12 VITALS — BP 150/86 | HR 89 | Ht 65.0 in | Wt 229.0 lb

## 2015-02-12 DIAGNOSIS — J984 Other disorders of lung: Secondary | ICD-10-CM | POA: Diagnosis not present

## 2015-02-12 DIAGNOSIS — J449 Chronic obstructive pulmonary disease, unspecified: Secondary | ICD-10-CM | POA: Diagnosis not present

## 2015-02-12 DIAGNOSIS — G4733 Obstructive sleep apnea (adult) (pediatric): Secondary | ICD-10-CM

## 2015-02-12 DIAGNOSIS — J189 Pneumonia, unspecified organism: Secondary | ICD-10-CM | POA: Diagnosis not present

## 2015-02-12 NOTE — Assessment & Plan Note (Signed)
He is not interested in further testing or therapy

## 2015-02-12 NOTE — Assessment & Plan Note (Signed)
Resolved

## 2015-02-12 NOTE — Progress Notes (Signed)
   Subjective:    Patient ID: Mark Robertson, male    DOB: [birth year], 74 y.o.   MRN: [id number]  HPI  74 year old ex-smoker for FU of COPD and obstructive sleep apnea. 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 He smoked 2 ppd until he quit in 2003 about 60 pack years He used Spiriva for many years, then quit due to dry mouth.  Significant tests/ events  Initial OV 2007 (wert)after nocturnal oximetry showed  significant desaturation x 74 minutes.  It was felt that he would not comply with nocturnal oxygen.   11/2013 FEV1-1.02 (40%)--> post BD 1.42 (56%)  PSG 2007 - weighed 204 pounds showed mild obstructive sleep apnea with RDI of 12 events per hour and mild oxygen desaturation with a minimum of 79% for 25% of total sleep time. He does not want further sleep testing and is not interested in CPAP therapy. CT chest 09/29/14 RUL cavitary consolidation  02/12/2015  Chief Complaint  Patient presents with  . Follow-up    patient doing well.  Mark Robertson is working well.  no complaints    He states he never had symptoms of fever, cough, dyspnea -but his imaging showed right upper lobe consolidation with subsequent cavitary breakdown.  CXR 11/24/14 >> improved RUL ASD with residual scarring He feels he is back to his baseline- denies cough and fevers Chest x-ray today shows resolving infiltrate He is compliant with BUN feels that this is helping him  Review of Systems neg for any significant sore throat, dysphagia, itching, sneezing, nasal congestion or excess/ purulent secretions, fever, chills, sweats, unintended wt loss, pleuritic or exertional cp, hempoptysis, orthopnea pnd or change in chronic leg swelling. Also denies presyncope, palpitations, heartburn, abdominal pain, nausea, vomiting, diarrhea or change in bowel or urinary habits, dysuria,hematuria, rash, arthralgias, visual complaints, headache, numbness weakness or ataxia.     Objective:   Physical Exam  Gen. Pleasant, obese, in no  distress ENT - no lesions, no post nasal drip Neck: No JVD, no thyromegaly, no carotid bruits Lungs: no use of accessory muscles, no dullness to percussion, decreased without rales or rhonchi  Cardiovascular: Rhythm regular, heart sounds  normal, no murmurs or gallops, no peripheral edema Musculoskeletal: No deformities, no cyanosis or clubbing , no tremors        Assessment & Plan:

## 2015-02-12 NOTE — Assessment & Plan Note (Signed)
Stay on Breo 

## 2015-02-12 NOTE — Patient Instructions (Signed)
CXR today Stay on breo

## 2015-03-09 DIAGNOSIS — M542 Cervicalgia: Secondary | ICD-10-CM | POA: Diagnosis not present

## 2015-03-26 DIAGNOSIS — M542 Cervicalgia: Secondary | ICD-10-CM | POA: Diagnosis not present

## 2015-05-04 DIAGNOSIS — C44612 Basal cell carcinoma of skin of right upper limb, including shoulder: Secondary | ICD-10-CM | POA: Diagnosis not present

## 2015-05-04 DIAGNOSIS — D485 Neoplasm of uncertain behavior of skin: Secondary | ICD-10-CM | POA: Diagnosis not present

## 2015-05-04 DIAGNOSIS — X32XXXA Exposure to sunlight, initial encounter: Secondary | ICD-10-CM | POA: Diagnosis not present

## 2015-05-04 DIAGNOSIS — L570 Actinic keratosis: Secondary | ICD-10-CM | POA: Diagnosis not present

## 2015-05-04 DIAGNOSIS — D1801 Hemangioma of skin and subcutaneous tissue: Secondary | ICD-10-CM | POA: Diagnosis not present

## 2015-05-04 DIAGNOSIS — C44622 Squamous cell carcinoma of skin of right upper limb, including shoulder: Secondary | ICD-10-CM | POA: Diagnosis not present

## 2015-06-05 DIAGNOSIS — L570 Actinic keratosis: Secondary | ICD-10-CM | POA: Diagnosis not present

## 2015-06-05 DIAGNOSIS — Z08 Encounter for follow-up examination after completed treatment for malignant neoplasm: Secondary | ICD-10-CM | POA: Diagnosis not present

## 2015-06-05 DIAGNOSIS — X32XXXD Exposure to sunlight, subsequent encounter: Secondary | ICD-10-CM | POA: Diagnosis not present

## 2015-06-05 DIAGNOSIS — Z85828 Personal history of other malignant neoplasm of skin: Secondary | ICD-10-CM | POA: Diagnosis not present

## 2015-07-22 IMAGING — CR DG CHEST 2V
2 series · 2 of 2 positions shown · non-contrast
Comparison: PA and lateral chest x-ray November 24, 2014

CLINICAL DATA: Follow-up of pneumonia, no chest complaints
currently, history of COPD and previous tobacco use

EXAM:
CHEST  2 VIEW

[view not recorded (1 of 2)]
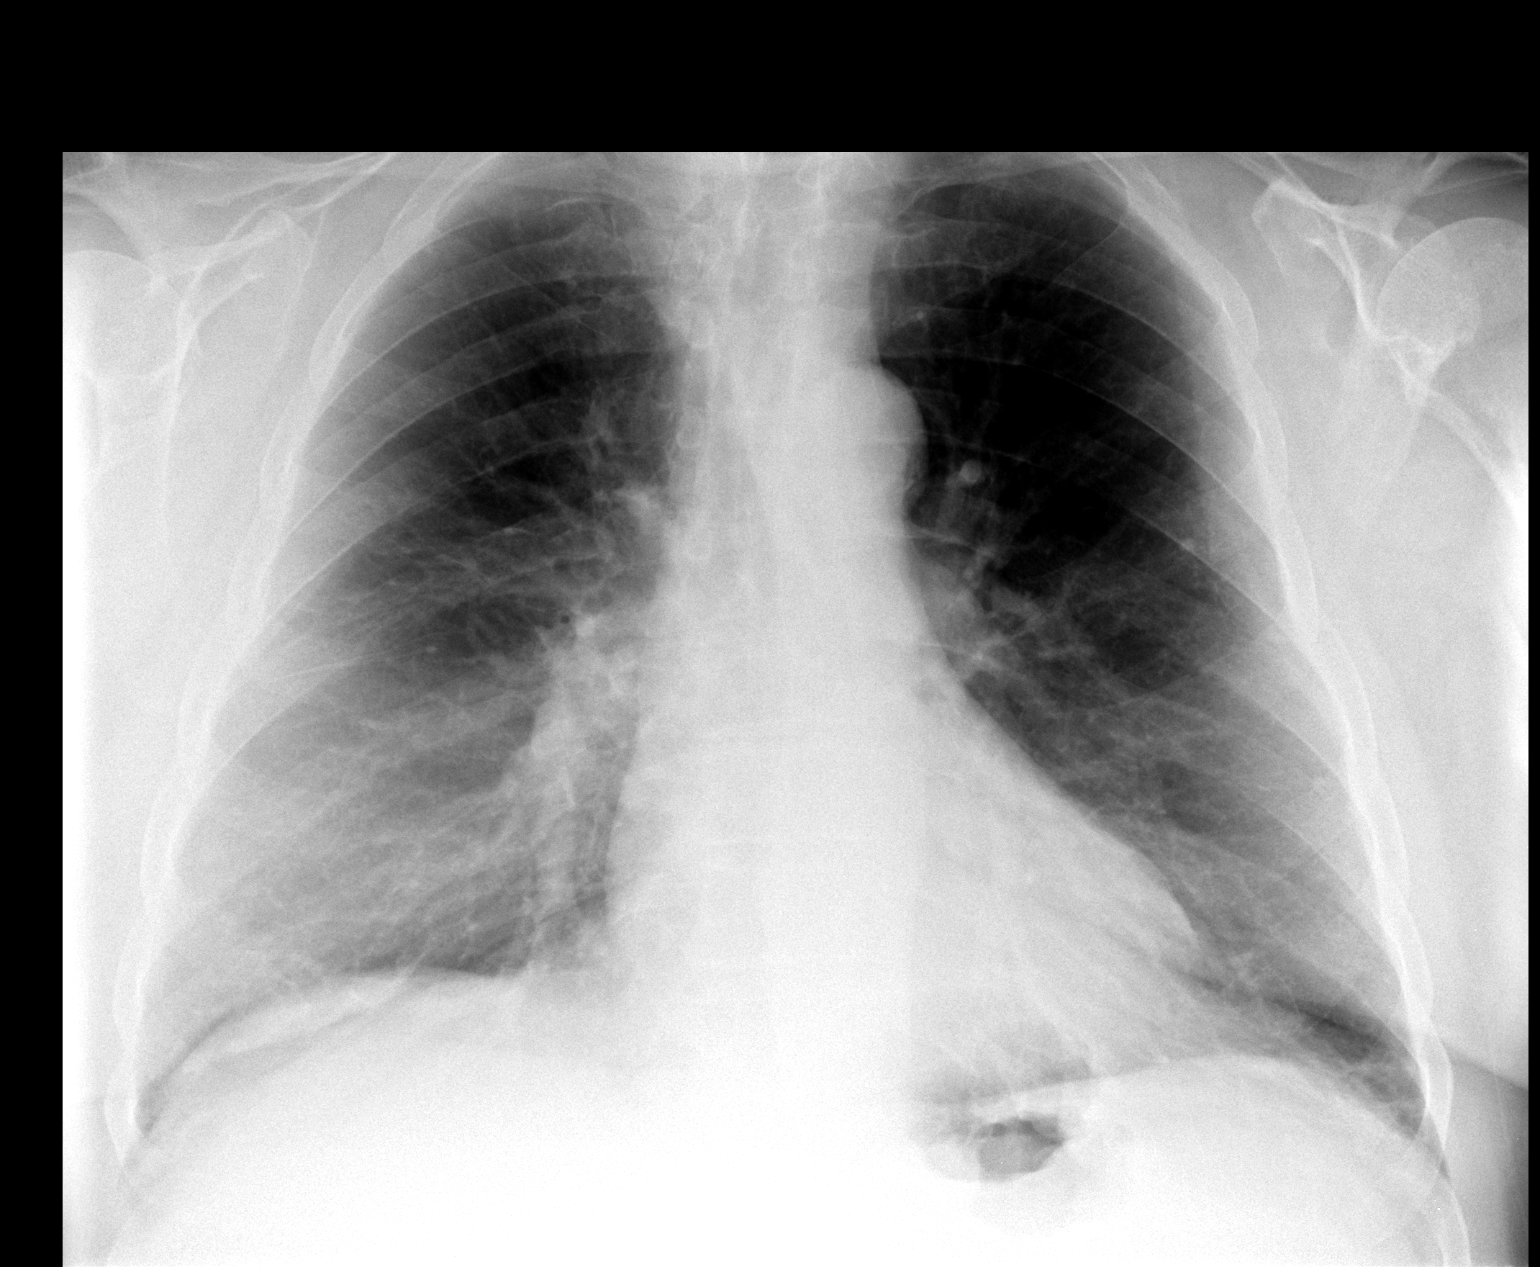

[view not recorded (2 of 2)]
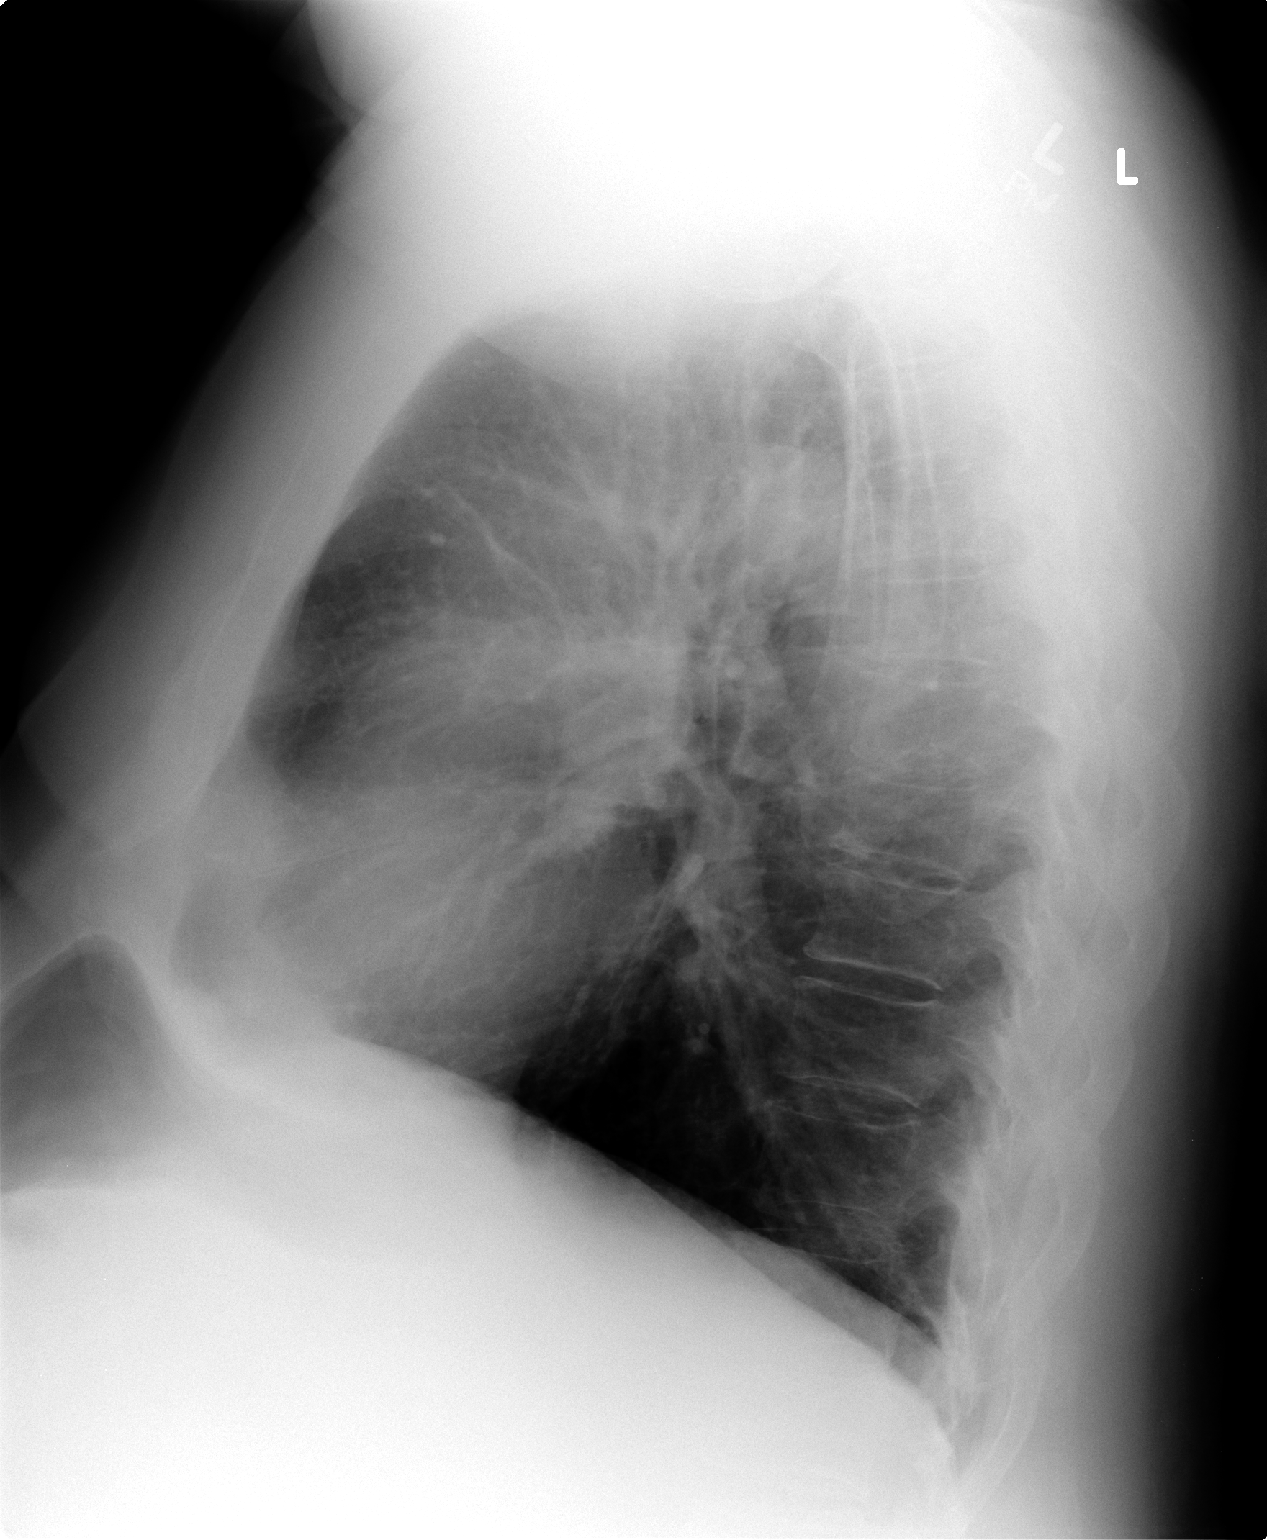

[2 of 2 positions shown; findings below may reference images not displayed]

FINDINGS: The lungs are well-expanded. The inferior segment right upper lobe
infiltrate has now completely cleared. There is minimal scarring in
the left lateral costophrenic gutter. The heart and pulmonary
vascularity are normal. The mediastinum is normal in width. The bony
thorax exhibits no acute abnormality. There is a stable 3 mm
diameter calcified nodule in the left mid lung.
IMPRESSION: COPD and evidence of previous granulomatous infection. Interval
resolution of right upper lobe pneumonia. There is no active
cardiopulmonary disease.

## 2015-07-27 DIAGNOSIS — Z23 Encounter for immunization: Secondary | ICD-10-CM | POA: Diagnosis not present

## 2015-08-14 ENCOUNTER — Ambulatory Visit (INDEPENDENT_AMBULATORY_CARE_PROVIDER_SITE_OTHER): Payer: Medicare Other | Admitting: Adult Health

## 2015-08-14 ENCOUNTER — Encounter: Payer: Self-pay | Admitting: Adult Health

## 2015-08-14 VITALS — BP 118/86 | HR 81 | Temp 98.1°F | Ht 65.0 in | Wt 230.0 lb

## 2015-08-14 DIAGNOSIS — J189 Pneumonia, unspecified organism: Secondary | ICD-10-CM

## 2015-08-14 DIAGNOSIS — J984 Other disorders of lung: Secondary | ICD-10-CM

## 2015-08-14 DIAGNOSIS — J449 Chronic obstructive pulmonary disease, unspecified: Secondary | ICD-10-CM

## 2015-08-14 NOTE — Assessment & Plan Note (Signed)
Previous RUL PNA resolved on last cxr in May  Clinically resolved.

## 2015-08-14 NOTE — Patient Instructions (Signed)
Keep up good work  Remain active as tolerated .  Continue on BREO daily  Follow up Dr. Elsworth Soho  In 1 year and As needed

## 2015-08-14 NOTE — Assessment & Plan Note (Signed)
Doing well on BREP  Plan  Keep up good work  Remain active as tolerated .  Continue on BREO daily  Follow up Dr. Elsworth Soho  In 1 year and As needed

## 2015-08-14 NOTE — Progress Notes (Signed)
   Subjective:    Patient ID: Mark Robertson, male    DOB: [birth year], 74 y.o.   MRN: [id number]  HPI 74 yo male with COPD and OSA (declines CPAP)   Significant tests/ events  Initial OV 2007 (wert)after nocturnal oximetry showed significant desaturation x 74 minutes. It was felt that he would not comply with nocturnal oxygen.   11/2013 FEV1-1.02 (40%)--> post BD 1.42 (56%)  PSG 2007 - weighed 204 pounds showed mild obstructive sleep apnea with RDI of 12 events per hour and mild oxygen desaturation with a minimum of 79% for 25% of total sleep time. He does not want further sleep testing and is not interested in CPAP therapy. CT chest 09/29/14 RUL cavitary consolidation 02/12/15 CXR resolved RUL PNA .    08/14/2015 Follow up: COPD  Patient returns for a six-month follow-up Patient has known severe COPD. He remains on BREO .  Says he has been doing well without any flare cough or wheezing. Shortness of breath is at baseline.   Denies any chest pain, orthopnea, PND or leg swelling hemoptysis  or fever Last visit. Chest x-ray showed total resolution of previous right upper lobe pneumonia. Flu shot is up-to-date.   Review of Systems Constitutional:   No  weight loss, night sweats,  Fevers, chills, fatigue, or  lassitude.  HEENT:   No headaches,  Difficulty swallowing,  Tooth/dental problems, or  Sore throat,                No sneezing, itching, ear ache, nasal congestion, post nasal drip,   CV:  No chest pain,  Orthopnea, PND, swelling in lower extremities, anasarca, dizziness, palpitations, syncope.   GI  No heartburn, indigestion, abdominal pain, nausea, vomiting, diarrhea, change in bowel habits, loss of appetite, bloody stools.   Resp:    No excess mucus, no productive cough,  No non-productive cough,  No coughing up of blood.  No change in color of mucus.  No wheezing.  No chest wall deformity  Skin: no rash or lesions.  GU: no dysuria, change in color of urine, no urgency  or frequency.  No flank pain, no hematuria   MS:  No joint pain or swelling.  No decreased range of motion.  No back pain.  Psych:  No change in mood or affect. No depression or anxiety.  No memory loss.         Objective:   Physical Exam  GEN: A/Ox3; pleasant , NAD, obese , elderly   HEENT:  [place]/AT,  EACs-clear, TMs-wnl, NOSE-clear, THROAT-clear, no lesions, no postnasal drip or exudate noted. Class 2 airway   NECK:  Supple w/ fair ROM; no JVD; normal carotid impulses w/o bruits; no thyromegaly or nodules palpated; no lymphadenopathy.  RESP  Decreased BS in bases  w/o, wheezes/ rales/ or rhonchi.no accessory muscle use, no dullness to percussion  CARD:  RRR, no m/r/g  , no peripheral edema, pulses intact, no cyanosis or clubbing.  GI:   Soft & nt; nml bowel sounds; no organomegaly or masses detected.  Musco: Warm bil, no deformities or joint swelling noted.   Neuro: alert, no focal deficits noted.    Skin: Warm, no lesions or rashes        Assessment & Plan:

## 2015-08-22 NOTE — Progress Notes (Signed)
Reviewed & agree with plan  

## 2015-11-22 DIAGNOSIS — H2513 Age-related nuclear cataract, bilateral: Secondary | ICD-10-CM | POA: Diagnosis not present

## 2015-12-24 DIAGNOSIS — Z13220 Encounter for screening for lipoid disorders: Secondary | ICD-10-CM | POA: Diagnosis not present

## 2015-12-24 DIAGNOSIS — I10 Essential (primary) hypertension: Secondary | ICD-10-CM | POA: Diagnosis not present

## 2015-12-24 DIAGNOSIS — Z125 Encounter for screening for malignant neoplasm of prostate: Secondary | ICD-10-CM | POA: Diagnosis not present

## 2015-12-24 DIAGNOSIS — Z79899 Other long term (current) drug therapy: Secondary | ICD-10-CM | POA: Diagnosis not present

## 2015-12-24 DIAGNOSIS — Z0000 Encounter for general adult medical examination without abnormal findings: Secondary | ICD-10-CM | POA: Diagnosis not present

## 2015-12-24 DIAGNOSIS — E7800 Pure hypercholesterolemia, unspecified: Secondary | ICD-10-CM | POA: Diagnosis not present

## 2015-12-27 DIAGNOSIS — Z0000 Encounter for general adult medical examination without abnormal findings: Secondary | ICD-10-CM | POA: Diagnosis not present

## 2015-12-27 DIAGNOSIS — J449 Chronic obstructive pulmonary disease, unspecified: Secondary | ICD-10-CM | POA: Diagnosis not present

## 2015-12-27 DIAGNOSIS — I10 Essential (primary) hypertension: Secondary | ICD-10-CM | POA: Diagnosis not present

## 2015-12-27 DIAGNOSIS — R748 Abnormal levels of other serum enzymes: Secondary | ICD-10-CM | POA: Diagnosis not present

## 2015-12-27 DIAGNOSIS — Z1212 Encounter for screening for malignant neoplasm of rectum: Secondary | ICD-10-CM | POA: Diagnosis not present

## 2016-01-09 DIAGNOSIS — Z1211 Encounter for screening for malignant neoplasm of colon: Secondary | ICD-10-CM | POA: Diagnosis not present

## 2016-01-09 DIAGNOSIS — Z1212 Encounter for screening for malignant neoplasm of rectum: Secondary | ICD-10-CM | POA: Diagnosis not present

## 2016-01-21 DIAGNOSIS — R748 Abnormal levels of other serum enzymes: Secondary | ICD-10-CM | POA: Diagnosis not present

## 2016-01-21 DIAGNOSIS — E7800 Pure hypercholesterolemia, unspecified: Secondary | ICD-10-CM | POA: Diagnosis not present

## 2016-01-23 DIAGNOSIS — R898 Other abnormal findings in specimens from other organs, systems and tissues: Secondary | ICD-10-CM | POA: Diagnosis not present

## 2016-01-23 DIAGNOSIS — L570 Actinic keratosis: Secondary | ICD-10-CM | POA: Diagnosis not present

## 2016-01-30 ENCOUNTER — Other Ambulatory Visit: Payer: Self-pay | Admitting: Gastroenterology

## 2016-01-30 DIAGNOSIS — R195 Other fecal abnormalities: Secondary | ICD-10-CM | POA: Diagnosis not present

## 2016-01-30 DIAGNOSIS — Z800 Family history of malignant neoplasm of digestive organs: Secondary | ICD-10-CM | POA: Diagnosis not present

## 2016-01-30 DIAGNOSIS — G4730 Sleep apnea, unspecified: Secondary | ICD-10-CM | POA: Diagnosis not present

## 2016-02-07 NOTE — Progress Notes (Signed)
02-07-16 1520 Spoke with pt today and states "a call by Caryl Pina of Dr. Ulyses Amor office, says procedure cancelled for 5-5- 17 -would be scheduled at a later time" Pt. Desires another call from you to confirm this. Dermot Gremillion,EN

## 2016-02-22 ENCOUNTER — Ambulatory Visit (HOSPITAL_COMMUNITY): Admission: RE | Admit: 2016-02-22 | Payer: Medicare Other | Source: Ambulatory Visit | Admitting: Gastroenterology

## 2016-02-22 ENCOUNTER — Encounter (HOSPITAL_COMMUNITY): Admission: RE | Payer: Self-pay | Source: Ambulatory Visit

## 2016-02-22 SURGERY — COLONOSCOPY
Anesthesia: Moderate Sedation

## 2016-03-24 DIAGNOSIS — L570 Actinic keratosis: Secondary | ICD-10-CM | POA: Diagnosis not present

## 2016-05-27 DIAGNOSIS — H18411 Arcus senilis, right eye: Secondary | ICD-10-CM | POA: Diagnosis not present

## 2016-05-27 DIAGNOSIS — H2511 Age-related nuclear cataract, right eye: Secondary | ICD-10-CM | POA: Diagnosis not present

## 2016-05-27 DIAGNOSIS — H25011 Cortical age-related cataract, right eye: Secondary | ICD-10-CM | POA: Diagnosis not present

## 2016-05-27 DIAGNOSIS — H18412 Arcus senilis, left eye: Secondary | ICD-10-CM | POA: Diagnosis not present

## 2016-06-27 DIAGNOSIS — H2511 Age-related nuclear cataract, right eye: Secondary | ICD-10-CM | POA: Diagnosis not present

## 2016-06-27 DIAGNOSIS — H2512 Age-related nuclear cataract, left eye: Secondary | ICD-10-CM | POA: Diagnosis not present

## 2016-06-27 DIAGNOSIS — Z961 Presence of intraocular lens: Secondary | ICD-10-CM | POA: Diagnosis not present

## 2016-06-27 HISTORY — PX: CATARACT EXTRACTION W/ INTRAOCULAR LENS IMPLANT: SHX1309

## 2016-07-04 ENCOUNTER — Encounter (HOSPITAL_COMMUNITY): Payer: Self-pay

## 2016-07-04 ENCOUNTER — Encounter (HOSPITAL_COMMUNITY)
Admission: RE | Admit: 2016-07-04 | Discharge: 2016-07-04 | Disposition: A | Discharge status: Home / Self Care | Payer: Non-veteran care | Source: Ambulatory Visit | Attending: Pulmonary Disease | Admitting: Pulmonary Disease

## 2016-07-04 VITALS — BP 150/78 | HR 75 | Resp 18 | Ht 65.25 in | Wt 236.3 lb

## 2016-07-04 DIAGNOSIS — K449 Diaphragmatic hernia without obstruction or gangrene: Secondary | ICD-10-CM | POA: Insufficient documentation

## 2016-07-04 DIAGNOSIS — J438 Other emphysema: Secondary | ICD-10-CM | POA: Insufficient documentation

## 2016-07-04 DIAGNOSIS — K224 Dyskinesia of esophagus: Secondary | ICD-10-CM | POA: Insufficient documentation

## 2016-07-04 DIAGNOSIS — F329 Major depressive disorder, single episode, unspecified: Secondary | ICD-10-CM | POA: Diagnosis not present

## 2016-07-04 DIAGNOSIS — I10 Essential (primary) hypertension: Secondary | ICD-10-CM | POA: Insufficient documentation

## 2016-07-04 DIAGNOSIS — E7800 Pure hypercholesterolemia, unspecified: Secondary | ICD-10-CM | POA: Insufficient documentation

## 2016-07-04 DIAGNOSIS — J449 Chronic obstructive pulmonary disease, unspecified: Secondary | ICD-10-CM | POA: Insufficient documentation

## 2016-07-04 NOTE — Progress Notes (Signed)
Mark Robertson 75 y.o. male Pulmonary Rehab Orientation Note Patient arrived today in Cardiac and Pulmonary Rehab for orientation to Pulmonary Rehab. He ambulated from [employer]. He has not been prescribed oxygen for home or portable use. Color good, skin warm and dry. Patient is oriented to time and place. Patient's medical history, psychosocial health, and medications reviewed. Psychosocial assessment reveals pt lives alone. He has been divorced twice. He has a son from his first marriage and a son and daughter from his second. He only has He is the primary caregiver for his 4 year old grandson. He denies stress however he verbalizes multiple times how hard it is to raise a grandchild at his age. His grandson has only been living with him for a year. Prior to that he and his sister has been in and out of foster care until the patients ex-wife received custody of the children. Once custody was established, patient took care of the boy and pt ex-wife took over care of the 46 year old girl. Patients daughter attempts to have a relationship with his grandchildren but due to her inability to care for herself and poor choices. Pt is currently retired. Pt hobbies include playing golf. He has not played in over a year because of back problems. Pt reports his stress level is low. Areas of stress/anxiety include Family and finances. He states his grocery bill is "through the roof trying to feed a 76 year old boy". Pt does not exhibit exhibits signs of depression. He does drink alcohol but it is unclear just how much he drinks. PHQ2/9 score 0/na. Pt shows fair  coping skills with unsure outlook . He was offered emotional support and reassurance, and a referral to spiritual care. He does not feel the need for counseling. Will continue to monitor and evaluate progress toward psychosocial goal(s) of managing stressful situations. Physical assessment reveals heart rate is normal. Breath sounds clear to auscultation, no  wheezes, rales, or rhonchi. Grip strength equal, strong. Distal pulses palpable. No edema noted. Patient reports he does take medications as prescribed. Patient states he follows a Regular diet. The patient reports no specific efforts to gain or lose weight.. Patient's weight will be monitored closely. Demonstration and practice of PLB using pulse oximeter. Patient able to return demonstration satisfactorily. Safety and hand hygiene in the exercise area reviewed with patient. Patient voices understanding of the information reviewed. Department expectations discussed with patient and achievable goals were set. The patient shows enthusiasm about attending the program and we look forward to working with this nice gentleman. The patient is scheduled for a 6 min walk test on 07/10/16 and to begin exercise on 10/17 after his second eye surgery.   45 minutes was spent on a variety of activities such as assessment of the patient, obtaining baseline data including height, weight, BMI, and grip strength, verifying medical history, allergies, and current medications, and teaching patient strategies for performing tasks with less respiratory effort with emphasis on pursed lip breathing.

## 2016-07-08 ENCOUNTER — Encounter (HOSPITAL_COMMUNITY): Payer: Self-pay | Admitting: *Deleted

## 2016-07-10 ENCOUNTER — Encounter (HOSPITAL_COMMUNITY)
Admission: RE | Admit: 2016-07-10 | Discharge: 2016-07-10 | Disposition: A | Discharge status: Home / Self Care | Payer: Non-veteran care | Source: Ambulatory Visit | Attending: Pulmonary Disease | Admitting: Pulmonary Disease

## 2016-07-10 DIAGNOSIS — J449 Chronic obstructive pulmonary disease, unspecified: Secondary | ICD-10-CM | POA: Diagnosis not present

## 2016-07-10 DIAGNOSIS — E7800 Pure hypercholesterolemia, unspecified: Secondary | ICD-10-CM | POA: Diagnosis not present

## 2016-07-10 DIAGNOSIS — J438 Other emphysema: Secondary | ICD-10-CM

## 2016-07-10 DIAGNOSIS — I10 Essential (primary) hypertension: Secondary | ICD-10-CM | POA: Diagnosis not present

## 2016-07-10 DIAGNOSIS — K224 Dyskinesia of esophagus: Secondary | ICD-10-CM | POA: Diagnosis not present

## 2016-07-10 DIAGNOSIS — K449 Diaphragmatic hernia without obstruction or gangrene: Secondary | ICD-10-CM | POA: Diagnosis not present

## 2016-07-10 NOTE — Progress Notes (Signed)
Pulmonary Individual Treatment Plan  Patient Details  Name: Mark Robertson MRN: [id number] Date of Birth: January 11, 1941 Referring Provider:   [name] Pulmonary Rehab Walk Test from 07/10/2016 in [place]  Referring Provider  [name] ([place])]      Initial Encounter Date:  Flowsheet Row Pulmonary Rehab Walk Test from 07/10/2016 in [place]  Date  07/10/16  Referring Provider  Dr.Yacoub Gwenette Greet ([place])]      Visit Diagnosis: Other emphysema ([place])  Patient's Home Medications on Admission:   Current Outpatient Prescriptions:  .  amoxicillin (AMOXIL) 500 MG tablet, Take 2,000 mg by mouth once as needed (He is to take prior to dental procedures.)., Disp: , Rfl:  .  aspirin EC 81 MG tablet, Take 81 mg by mouth every evening., Disp: , Rfl:  .  BREO ELLIPTA 100-25 MCG/INH AEPB, Inhale 1 puff into the lungs daily. , Disp: , Rfl:  .  Cholecalciferol (VITAMIN D) 2000 UNITS tablet, Take 2,000 Units by mouth daily., Disp: , Rfl:  .  flunisolide (NASALIDE) 0.025 % SOLN, Inhale 1 spray into the lungs daily as needed (For allergies.). , Disp: , Rfl:  .  gabapentin (NEURONTIN) 300 MG capsule, Take 300 mg by mouth 2 (two) times daily., Disp: , Rfl:  .  losartan (COZAAR) 50 MG tablet, Take 50 mg by mouth daily with breakfast. , Disp: , Rfl:  .  Menthol, Topical Analgesic, (ICY HOT EX), Apply 1 application topically daily as needed (For pain.)., Disp: , Rfl:  .  Multiple Vitamins-Minerals (CENTRUM SILVER) tablet, Take 1 tablet by mouth daily with breakfast., Disp: , Rfl:  .  naproxen sodium (ANAPROX) 220 MG tablet, Take 220-440 mg by mouth 2 (two) times daily with a meal. He takes two tablets in the morning and one tablet in the evening., Disp: , Rfl:  .  Polyethyl Glycol-Propyl Glycol (SYSTANE) 0.4-0.3 % SOLN, Place 1-2 drops into both eyes daily as needed (For dry eyes.)., Disp: , Rfl:  .  ranitidine (ZANTAC) 150 MG tablet, Take  150 mg by mouth daily., Disp: , Rfl:  .  rosuvastatin (CRESTOR) 10 MG tablet, Take 10 mg by mouth every evening., Disp: , Rfl:  .  Sildenafil Citrate (VIAGRA PO), Take 1 tablet by mouth daily as needed (For erectile dysfunction.)., Disp: , Rfl:  .  traZODone (DESYREL) 50 MG tablet, Take 50 mg by mouth at bedtime., Disp: , Rfl:   Past Medical History: Past Medical History:  Diagnosis Date  . Cavitary pneumonia 09/29/2014  . COPD (chronic obstructive pulmonary disease) ([place])   . Depression   . Esophageal dysmotility 09/30/2014  . Esophageal thickening 09/29/2014  . Hiatal hernia 09/30/2014  . Hypercholesteremia   . Hypertension     Tobacco Use: History  Smoking Status  . Former Smoker  . Packs/day: 2.00  . Years: 50.00  . Types: Cigarettes  . Quit date: 10/13/2001  Smokeless Tobacco  . Not on file    Labs: Recent Review Flowsheet Data    Labs for ITP Cardiac and Pulmonary Rehab Latest Ref Rng & Units 09/29/2014   TCO2 0 - 100 mmol/L 33      Capillary Blood Glucose: No results found for: GLUCAP   ADL UCSD:     Pulmonary Assessment Scores    Row Name 07/08/16 1505         ADL UCSD   ADL Phase Entry     SOB Score total 107  Pulmonary Function Assessment:     Pulmonary Function Assessment - 07/04/16 1030      Breath   Bilateral Breath Sounds Clear   Shortness of Breath No  resolved with Breo      Exercise Target Goals: Date: 07/10/16  Exercise Program Goal: Individual exercise prescription set with THRR, safety & activity barriers. Participant demonstrates ability to understand and report RPE using BORG scale, to self-measure pulse accurately, and to acknowledge the importance of the exercise prescription.  Exercise Prescription Goal: Starting with aerobic activity 30 plus minutes a day, 3 days per week for initial exercise prescription. Provide home exercise prescription and guidelines that participant acknowledges understanding prior to  discharge.  Activity Barriers & Risk Stratification:     Activity Barriers & Cardiac Risk Stratification - 07/04/16 1030      Activity Barriers & Cardiac Risk Stratification   Activity Barriers Left Knee Replacement;Right Knee Replacement;Deconditioning;Shortness of Breath;Neck/Spine Problems      6 Minute Walk:     6 Minute Walk    Row Name 07/10/16 1601         6 Minute Walk   Phase Initial     Distance 800 feet     Walk Time 6 minutes     # of Rest Breaks 0     MPH 1.5     METS 2.15     RPE 11     Perceived Dyspnea  0     Symptoms No     Resting HR 81 bpm     Resting BP 140/82     Max Ex. HR 98 bpm     Max Ex. BP 164/80     2 Minute Post BP 140/84       Interval HR   Baseline HR 81     1 Minute HR 86     2 Minute HR 97     3 Minute HR 98     4 Minute HR 93     5 Minute HR 94     6 Minute HR 112     2 Minute Post HR 90     Interval Heart Rate? Yes       Interval Oxygen   Interval Oxygen? Yes     Baseline Oxygen Saturation % 92 %     Baseline Liters of Oxygen 0 L     1 Minute Oxygen Saturation % 85 %     1 Minute Liters of Oxygen 0 L     2 Minute Oxygen Saturation % 94 %     2 Minute Liters of Oxygen 2 L     3 Minute Oxygen Saturation % 90 %     3 Minute Liters of Oxygen 2 L     4 Minute Oxygen Saturation % 93 %     4 Minute Liters of Oxygen 2 L     5 Minute Oxygen Saturation % 92 %     5 Minute Liters of Oxygen 2 L     6 Minute Oxygen Saturation % 96 %     6 Minute Liters of Oxygen 2 L     2 Minute Post Oxygen Saturation % 98 %     2 Minute Post Liters of Oxygen 2 L        Initial Exercise Prescription:     Initial Exercise Prescription - 07/10/16 1600      Date of Initial Exercise RX and Referring Provider   Date 07/10/16  Referring Provider Dr.Yacoub  Clance ([place])     Oxygen   Oxygen Continuous   Liters 2     Bike   Level 0.5   Minutes 17     NuStep   Level 2   Minutes 17   METs 1.5     Track   Laps 5   Minutes 17      Prescription Details   Frequency (times per week) 2   Duration Progress to 45 minutes of aerobic exercise without signs/symptoms of physical distress     Intensity   THRR 40-80% of Max Heartrate 58-116   Ratings of Perceived Exertion 11-13   Perceived Dyspnea 0-4     Progression   Progression Continue progressive overload as per policy without signs/symptoms or physical distress.     Resistance Training   Training Prescription Yes   Weight GREEN BAND   Reps 10-12      Perform Capillary Blood Glucose checks as needed.  Exercise Prescription Changes:   Exercise Comments:   Discharge Exercise Prescription (Final Exercise Prescription Changes):    Nutrition:  Target Goals: Understanding of nutrition guidelines, daily intake of sodium 1500mg , cholesterol 200mg , calories 30% from fat and 7% or less from saturated fats, daily to have 5 or more servings of fruits and vegetables.  Biometrics:    Nutrition Therapy Plan and Nutrition Goals:   Nutrition Discharge: Rate Your Plate Scores:   Psychosocial: Target Goals: Acknowledge presence or absence of depression, maximize coping skills, provide positive support system. Participant is able to verbalize types and ability to use techniques and skills needed for reducing stress and depression.  Initial Review & Psychosocial Screening:     Initial Psych Review & Screening - 07/04/16 1036      Initial Review   Current issues with History of Depression     Family Dynamics   Good Support System? Yes     Barriers   Psychosocial barriers to participate in program Psychosocial barriers identified (see note)     Screening Interventions   Interventions Encouraged to exercise      Quality of Life Scores:     Quality of Life - 07/08/16 1505      Quality of Life Scores   Health/Function Pre 20.8 %   Socioeconomic Pre 13.5 %   Psych/Spiritual Pre 23 %   Family Pre 21 %   GLOBAL Pre 19.45 %      PHQ-9: Recent  Review Flowsheet Data    Depression screen [facility] 2/9 07/04/2016   Decreased Interest 0   Down, Depressed, Hopeless 0   PHQ - 2 Score 0      Psychosocial Evaluation and Intervention:     Psychosocial Evaluation - 07/04/16 1145      Psychosocial Evaluation & Interventions   Interventions Encouraged to exercise with the program and follow exercise prescription      Psychosocial Re-Evaluation:  Education: Education Goals: Education classes will be provided on a weekly basis, covering required topics. Participant will state understanding/return demonstration of topics presented.  Learning Barriers/Preferences:     Learning Barriers/Preferences - 07/04/16 1030      Learning Barriers/Preferences   Learning Barriers None   Learning Preferences Computer/Internet;Group Instruction;Individual Instruction;Skilled Demonstration;Verbal Instruction;Written Material;Video      Education Topics: Risk Factor Reduction:  -Group instruction that is supported by a PowerPoint presentation. Instructor discusses the definition of a risk factor, different risk factors for pulmonary disease, and how the heart and lungs work together.  Nutrition for Pulmonary Patient:  -Group instruction provided by PowerPoint slides, verbal discussion, and written materials to support subject matter. The instructor gives an explanation and review of healthy diet recommendations, which includes a discussion on weight management, recommendations for fruit and vegetable consumption, as well as protein, fluid, caffeine, fiber, sodium, sugar, and alcohol. Tips for eating when patients are short of breath are discussed.   Pursed Lip Breathing:  -Group instruction that is supported by demonstration and informational handouts. Instructor discusses the benefits of pursed lip and diaphragmatic breathing and detailed demonstration on how to preform both.     Oxygen Safety:  -Group instruction provided by PowerPoint,  verbal discussion, and written material to support subject matter. There is an overview of "What is Oxygen" and "Why do we need it".  Instructor also reviews how to create a safe environment for oxygen use, the importance of using oxygen as prescribed, and the risks of noncompliance. There is a brief discussion on traveling with oxygen and resources the patient may utilize.   Oxygen Equipment:  -Group instruction provided by [facility] Staff utilizing handouts, written materials, and equipment demonstrations.   Signs and Symptoms:  -Group instruction provided by written material and verbal discussion to support subject matter. Warning signs and symptoms of infection, stroke, and heart attack are reviewed and when to call the physician/911 reinforced. Tips for preventing the spread of infection discussed.   Advanced Directives:  -Group instruction provided by verbal instruction and written material to support subject matter. Instructor reviews Advanced Directive laws and proper instruction for filling out document.   Pulmonary Video:  -Group video education that reviews the importance of medication and oxygen compliance, exercise, good nutrition, pulmonary hygiene, and pursed lip and diaphragmatic breathing for the pulmonary patient.   Exercise for the Pulmonary Patient:  -Group instruction that is supported by a PowerPoint presentation. Instructor discusses benefits of exercise, core components of exercise, frequency, duration, and intensity of an exercise routine, importance of utilizing pulse oximetry during exercise, safety while exercising, and options of places to exercise outside of rehab.     Pulmonary Medications:  -Verbally interactive group education provided by instructor with focus on inhaled medications and proper administration.   Anatomy and Physiology of the Respiratory System and Intimacy:  -Group instruction provided by PowerPoint, verbal discussion, and written  material to support subject matter. Instructor reviews respiratory cycle and anatomical components of the respiratory system and their functions. Instructor also reviews differences in obstructive and restrictive respiratory diseases with examples of each. Intimacy, Sex, and Sexuality differences are reviewed with a discussion on how relationships can change when diagnosed with pulmonary disease. Common sexual concerns are reviewed.   Knowledge Questionnaire Score:     Knowledge Questionnaire Score - 07/08/16 1505      Knowledge Questionnaire Score   Pre Score 8/13      Core Components/Risk Factors/Patient Goals at Admission:     Personal Goals and Risk Factors at Admission - 07/04/16 1142      Core Components/Risk Factors/Patient Goals on Admission   Stress --  Patient feels he is not stressed however he verbalizes multiple times how hard it is to raise a teenage boy at his age.      Core Components/Risk Factors/Patient Goals Review:    Core Components/Risk Factors/Patient Goals at Discharge (Final Review):    ITP Comments:   Comments:

## 2016-07-18 DIAGNOSIS — H2512 Age-related nuclear cataract, left eye: Secondary | ICD-10-CM | POA: Diagnosis not present

## 2016-07-25 DIAGNOSIS — Z23 Encounter for immunization: Secondary | ICD-10-CM | POA: Diagnosis not present

## 2016-07-29 ENCOUNTER — Encounter (HOSPITAL_COMMUNITY)
Admission: RE | Admit: 2016-07-29 | Discharge: 2016-07-29 | Disposition: A | Discharge status: Home / Self Care | Payer: Non-veteran care | Source: Ambulatory Visit | Attending: Pulmonary Disease | Admitting: Pulmonary Disease

## 2016-07-29 DIAGNOSIS — I10 Essential (primary) hypertension: Secondary | ICD-10-CM | POA: Insufficient documentation

## 2016-07-29 DIAGNOSIS — J438 Other emphysema: Secondary | ICD-10-CM | POA: Insufficient documentation

## 2016-07-29 DIAGNOSIS — F329 Major depressive disorder, single episode, unspecified: Secondary | ICD-10-CM | POA: Diagnosis not present

## 2016-07-29 DIAGNOSIS — J449 Chronic obstructive pulmonary disease, unspecified: Secondary | ICD-10-CM | POA: Insufficient documentation

## 2016-07-29 DIAGNOSIS — K449 Diaphragmatic hernia without obstruction or gangrene: Secondary | ICD-10-CM | POA: Diagnosis not present

## 2016-07-29 DIAGNOSIS — E7800 Pure hypercholesterolemia, unspecified: Secondary | ICD-10-CM | POA: Insufficient documentation

## 2016-07-29 DIAGNOSIS — K224 Dyskinesia of esophagus: Secondary | ICD-10-CM | POA: Diagnosis not present

## 2016-07-29 NOTE — Progress Notes (Signed)
Incomplete Session Note  Patient Details  Name: Mark Robertson MRN: [id number] Date of Birth: [birth year] Referring Provider:   [name] Pulmonary Rehab Walk Test from 07/10/2016 in [place]  Referring Provider  Dr.Yacoub Gwenette Greet ([place])]      [name] did not complete his rehab session. Mr. Mark Robertson presented to his first pulmonary rehab exercise session denying complaints. Check in vitals as follows Wt 107.3kg, Sats 94% RA, HR 84, BP 150/60 at rest. He was placed on 2 liters O2 as determined by previous 6 min walk test. Vitials taken during his first 15 min exercise (le[place]urly walking track) Sats 93 2L, HR 113, BP 180/84 manual. Sitting reck 192/100, after 5 min rest 188/90, after 15 min rest 180/90. Patient continues to denies s/s of HTN. Patient verbalized compliance with am losartan 50mg . Spoke with Gay Filler, RN with Dr. Pennie Banter office. Patient has appointment this afternoon at 2:45 with extender. Patient verbalized understanding. Also educated patient on S/S of HTN crisis. Will follow up with patient at next exercise session. Patient discharged from pulmonary rehab exercise in stable condition.

## 2016-07-31 ENCOUNTER — Encounter (HOSPITAL_COMMUNITY)
Admission: RE | Admit: 2016-07-31 | Discharge: 2016-07-31 | Disposition: A | Discharge status: Home / Self Care | Payer: Non-veteran care | Source: Ambulatory Visit | Attending: Pulmonary Disease | Admitting: Pulmonary Disease

## 2016-07-31 DIAGNOSIS — I10 Essential (primary) hypertension: Secondary | ICD-10-CM | POA: Diagnosis not present

## 2016-07-31 DIAGNOSIS — J449 Chronic obstructive pulmonary disease, unspecified: Secondary | ICD-10-CM | POA: Diagnosis not present

## 2016-07-31 DIAGNOSIS — K449 Diaphragmatic hernia without obstruction or gangrene: Secondary | ICD-10-CM | POA: Diagnosis not present

## 2016-07-31 DIAGNOSIS — K224 Dyskinesia of esophagus: Secondary | ICD-10-CM | POA: Diagnosis not present

## 2016-07-31 DIAGNOSIS — J438 Other emphysema: Secondary | ICD-10-CM | POA: Diagnosis not present

## 2016-07-31 DIAGNOSIS — E7800 Pure hypercholesterolemia, unspecified: Secondary | ICD-10-CM | POA: Diagnosis not present

## 2016-07-31 NOTE — Progress Notes (Signed)
Daily Session Note  Patient Details  Name: Mark Robertson MRN: [id number] Date of Birth: 1941-05-23 Referring Provider:   [name] Pulmonary Rehab Walk Test from 07/10/2016 in [place]  Referring Provider  [name] ([place])]      Encounter Date: 07/31/2016  Check In:     Session Check In - 07/31/16 1030      Check-In   Location [facility]   Staff Present Rosebud Poles, RN, BSN;Molly diVincenzo, MS, ACSM RCEP, Exercise Physiologist;Annedrea Rosezella [place], RN, MHA;Portia Rollene Rotunda, RN, BSN   Supervising physician immediately available to respond to emergencies [facility] immediately available   Physician(s) Dr. Rockne Menghini   Medication changes reported     No   Fall or balance concerns reported    No   Warm-up and Cool-down Performed as group-led instruction   Resistance Training Performed Yes   VAD Patient? No     Pain Assessment   Currently in Pain? No/denies   Multiple Pain Sites No      Capillary Blood Glucose: No results found for this or any previous visit (from the past 24 hour(s)).      Exercise Prescription Changes - 07/31/16 1200      Response to Exercise   Blood Pressure (Admit) 168/84   Blood Pressure (Exercise) 154/80   Blood Pressure (Exit) 142/66   Heart Rate (Admit) 88 bpm   Heart Rate (Exercise) 106 bpm   Heart Rate (Exit) 85 bpm   Oxygen Saturation (Admit) 96 %   Oxygen Saturation (Exercise) 93 %   Oxygen Saturation (Exit) 97 %   Rating of Perceived Exertion (Exercise) 11   Perceived Dyspnea (Exercise) 1   Duration Progress to 45 minutes of aerobic exercise without signs/symptoms of physical distress   Intensity --  40-80% of HRR     Resistance Training   Training Prescription Yes   Weight GREEN BAND   Reps 10-12  10 minutes of strength training     Interval Training   Interval Training No     Oxygen   Oxygen Continuous     Bike   Level 0.5   Minutes 17     NuStep   Level 2    Minutes 17   METs 1.9     Goals Met:  Exercise tolerated well Strength training completed today  Goals Unmet:  Not Applicable  Comments: Service time is from 1030 to 1200    Dr. Rush Farmer is Medical Director for Pulmonary Rehab at [facility].

## 2016-08-05 ENCOUNTER — Encounter (HOSPITAL_COMMUNITY)
Admission: RE | Admit: 2016-08-05 | Discharge: 2016-08-05 | Disposition: A | Discharge status: Home / Self Care | Payer: Non-veteran care | Source: Ambulatory Visit | Attending: Pulmonary Disease | Admitting: Pulmonary Disease

## 2016-08-05 VITALS — Wt 237.9 lb

## 2016-08-05 DIAGNOSIS — J438 Other emphysema: Secondary | ICD-10-CM | POA: Diagnosis not present

## 2016-08-05 DIAGNOSIS — K224 Dyskinesia of esophagus: Secondary | ICD-10-CM | POA: Diagnosis not present

## 2016-08-05 DIAGNOSIS — I10 Essential (primary) hypertension: Secondary | ICD-10-CM | POA: Diagnosis not present

## 2016-08-05 DIAGNOSIS — E7800 Pure hypercholesterolemia, unspecified: Secondary | ICD-10-CM | POA: Diagnosis not present

## 2016-08-05 DIAGNOSIS — K449 Diaphragmatic hernia without obstruction or gangrene: Secondary | ICD-10-CM | POA: Diagnosis not present

## 2016-08-05 DIAGNOSIS — J449 Chronic obstructive pulmonary disease, unspecified: Secondary | ICD-10-CM | POA: Diagnosis not present

## 2016-08-05 NOTE — Progress Notes (Signed)
Daily Session Note  Patient Details  Name: Mark Robertson MRN: [id number] Date of Birth: 1941/08/08 Referring Provider:   [name] Pulmonary Rehab Walk Test from 07/10/2016 in [place]  Referring Provider  [name] ([place])]      Encounter Date: 08/05/2016  Check In:     Session Check In - 08/05/16 1030      Check-In   Location [facility]   Staff Present Rosebud Poles, RN, BSN;Molly diVincenzo, MS, ACSM RCEP, Exercise Physiologist;Annedrea Rosezella [place], RN, MHA;Portia Rollene Rotunda, RN, BSN   Supervising physician immediately available to respond to emergencies [facility] immediately available   Physician(s) Dr. Rockne Menghini   Medication changes reported     No   Fall or balance concerns reported    No   Warm-up and Cool-down Performed as group-led instruction   Resistance Training Performed Yes   VAD Patient? No     Pain Assessment   Currently in Pain? No/denies   Multiple Pain Sites No      Capillary Blood Glucose: No results found for this or any previous visit (from the past 24 hour(s)).      Exercise Prescription Changes - 08/05/16 1200      Response to Exercise   Blood Pressure (Admit) 150/76   Blood Pressure (Exercise) 154/76   Blood Pressure (Exit) 144/72   Heart Rate (Admit) 80 bpm   Heart Rate (Exercise) 103 bpm   Heart Rate (Exit) 88 bpm   Oxygen Saturation (Admit) 98 %   Oxygen Saturation (Exercise) 95 %   Oxygen Saturation (Exit) 93 %   Rating of Perceived Exertion (Exercise) 9   Perceived Dyspnea (Exercise) 0   Duration Progress to 45 minutes of aerobic exercise without signs/symptoms of physical distress   Intensity THRR unchanged     Resistance Training   Training Prescription Yes   Weight GREEN BAND   Reps 10-12  10 minutes of strength training     Interval Training   Interval Training No     Oxygen   Oxygen Continuous   Liters 2     Bike   Level 1   Minutes 17     NuStep   Level 3   Minutes 17   METs 2.5     Track   Laps --  met with nutritionist during this station   Minutes 17     Goals Met:  Exercise tolerated well Strength training completed today  Goals Unmet:  Not Applicable  Comments: Service time is from 1030 to 1200    Dr. Rush Farmer is Medical Director for Pulmonary Rehab at [facility].

## 2016-08-05 NOTE — Progress Notes (Signed)
Mark Robertson 75 y.o. male Nutrition Note Spoke with pt. Pt is obese. Pt wants to lose wt. Pt feels exercising in rehab with help him lose wt. Wt loss tips discussed. There are some ways the pt can make his eating habits healthier. Ways pt can improve his diet discussed when pt's Rate Your Plate results reviewed. Pt does not currently avoid salty food; uses canned/ convenience food.  Pt adds salt to food only while cooking.  The role of sodium in lung disease reviewed with pt. 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 Pt expressed understanding of the information reviewed via feedback method.   No results found for: HGBA1C  Nutrition Diagnosis ? Food-and nutrition-related knowledge deficit related to lack of exposure to information as related to diagnosis of pulmonary disease ? Obesity related to excessive energy intake as evidenced by a BMI of 39.1  Nutrition Intervention ? Pt's individual nutrition plan and goals reviewed with pt. ? Benefits of adopting healthy eating habits discussed when pt's Rate Your Plate reviewed. ? Pt to attend the Nutrition and Lung Disease class ? Continual client-centered nutrition education by RD, as part of interdisciplinary care.  Goal(s) 1. Identify food quantities necessary to achieve wt loss of  -2# per week to a goal wt loss of 2.7-10.9 kg (6-24 lb) at graduation from pulmonary rehab. Monitor and Evaluate progress toward nutrition goal with team.   Derek Mound, M.Ed, RD, LDN, CDE 08/05/2016 12:26 PM

## 2016-08-07 ENCOUNTER — Encounter (HOSPITAL_COMMUNITY)
Admission: RE | Admit: 2016-08-07 | Discharge: 2016-08-07 | Disposition: A | Discharge status: Home / Self Care | Payer: Non-veteran care | Source: Ambulatory Visit | Attending: Pulmonary Disease | Admitting: Pulmonary Disease

## 2016-08-07 VITALS — Wt 236.3 lb

## 2016-08-07 DIAGNOSIS — J438 Other emphysema: Secondary | ICD-10-CM | POA: Diagnosis not present

## 2016-08-07 DIAGNOSIS — E7800 Pure hypercholesterolemia, unspecified: Secondary | ICD-10-CM | POA: Diagnosis not present

## 2016-08-07 DIAGNOSIS — J449 Chronic obstructive pulmonary disease, unspecified: Secondary | ICD-10-CM | POA: Diagnosis not present

## 2016-08-07 DIAGNOSIS — I10 Essential (primary) hypertension: Secondary | ICD-10-CM | POA: Diagnosis not present

## 2016-08-07 DIAGNOSIS — K224 Dyskinesia of esophagus: Secondary | ICD-10-CM | POA: Diagnosis not present

## 2016-08-07 DIAGNOSIS — K449 Diaphragmatic hernia without obstruction or gangrene: Secondary | ICD-10-CM | POA: Diagnosis not present

## 2016-08-07 NOTE — Progress Notes (Signed)
Daily Session Note  Patient Details  Name: Mark Robertson MRN: [id number] Date of Birth: 29-Aug-1941 Referring Provider:   [name] Pulmonary Rehab Walk Test from 07/10/2016 in [place]  Referring Provider  [name] ([place])]      Encounter Date: 08/07/2016  Check In:     Session Check In - 08/07/16 1016      Check-In   Location [facility]   Staff Present Su Hilt, MS, ACSM RCEP, Exercise Physiologist;Lisa Ysidro Evert, RN;Hendel Gatliff [place], RN, Maxcine Ham, RN, BSN   Supervising physician immediately available to respond to emergencies [facility] immediately available   Physician(s) Dr. Alfredia Ferguson   Medication changes reported     No   Fall or balance concerns reported    No   Warm-up and Cool-down Performed as group-led instruction   Resistance Training Performed Yes   VAD Patient? No     Pain Assessment   Currently in Pain? No/denies   Multiple Pain Sites No      Capillary Blood Glucose: No results found for this or any previous visit (from the past 24 hour(s)).      Exercise Prescription Changes - 08/07/16 1313      Response to Exercise   Blood Pressure (Admit) 150/76   Blood Pressure (Exercise) 200/84  reck 190/93   Blood Pressure (Exit) 152/86   Heart Rate (Admit) 81 bpm   Heart Rate (Exercise) 116 bpm   Heart Rate (Exit) 88 bpm   Oxygen Saturation (Admit) 94 %   Oxygen Saturation (Exercise) 95 %   Oxygen Saturation (Exit) 94 %   Rating of Perceived Exertion (Exercise) 11   Perceived Dyspnea (Exercise) 1   Duration Progress to 45 minutes of aerobic exercise without signs/symptoms of physical distress   Intensity THRR unchanged     Resistance Training   Training Prescription Yes   Weight GREEN BAND   Reps 10-12  10 minutes of strength training     Interval Training   Interval Training No     Oxygen   Oxygen Continuous   Liters 2     Track   Laps 13   Minutes 17     Goals  Met:  Queuing for purse lip breathing No report of cardiac concerns or symptoms Strength training completed today  Goals Unmet:  BP, Patient only exercised on one station related to increased BP while walking the track. Patient states he took am medications as prescribed. Described high sodium intake as well as 2 16 oz beers last evening. Patient educated on effects of alcohol and daily consumption of alcohol as well as high sodium foods.   Comments: Service time is from 1030 to 1230. Patient also attended a question and answer session with the pulmonary rehab medical director, Dr. Ellin Goodie   Dr. Rush Farmer is Medical Director for Pulmonary Rehab at [facility].

## 2016-08-12 ENCOUNTER — Encounter (HOSPITAL_COMMUNITY)
Admission: RE | Admit: 2016-08-12 | Discharge: 2016-08-12 | Disposition: A | Discharge status: Home / Self Care | Payer: Non-veteran care | Source: Ambulatory Visit | Attending: Pulmonary Disease | Admitting: Pulmonary Disease

## 2016-08-12 DIAGNOSIS — I10 Essential (primary) hypertension: Secondary | ICD-10-CM | POA: Diagnosis not present

## 2016-08-12 DIAGNOSIS — J438 Other emphysema: Secondary | ICD-10-CM

## 2016-08-12 NOTE — Progress Notes (Signed)
Incomplete Session Note  Patient Details  Name: Mark Robertson MRN: [id number] Date of Birth: [birth year] Referring Provider:   [name] Pulmonary Rehab Walk Test from 07/10/2016 in [place]  Referring Provider  Dr.Yacoub Gwenette Greet ([place])]      Mark Robertson did not complete his rehab session. At check in his resting BP was 180/82 manually. After an additional 15 min of rest/sitting his BP increased to 180/100. Patient admits to being under a significant amount of stress and anxiety. 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 The patient also admits to consuming alcoholic beverages daily. We again discussed alcohol cessation, low NA diet, medication compliance, and stress reduction. Patient verbalizes compliance with medication regimine. Notified PCPs office. Spoke with Gay Filler. Patient has appointment at 3:15.

## 2016-08-14 ENCOUNTER — Encounter (HOSPITAL_COMMUNITY)
Admission: RE | Admit: 2016-08-14 | Discharge: 2016-08-14 | Disposition: A | Discharge status: Home / Self Care | Payer: Non-veteran care | Source: Ambulatory Visit | Attending: Pulmonary Disease | Admitting: Pulmonary Disease

## 2016-08-14 VITALS — Wt 235.9 lb

## 2016-08-14 DIAGNOSIS — J438 Other emphysema: Secondary | ICD-10-CM | POA: Diagnosis present

## 2016-08-14 DIAGNOSIS — J449 Chronic obstructive pulmonary disease, unspecified: Secondary | ICD-10-CM | POA: Insufficient documentation

## 2016-08-14 DIAGNOSIS — K224 Dyskinesia of esophagus: Secondary | ICD-10-CM | POA: Insufficient documentation

## 2016-08-14 DIAGNOSIS — K449 Diaphragmatic hernia without obstruction or gangrene: Secondary | ICD-10-CM | POA: Diagnosis not present

## 2016-08-14 DIAGNOSIS — I10 Essential (primary) hypertension: Secondary | ICD-10-CM | POA: Diagnosis not present

## 2016-08-14 DIAGNOSIS — E7800 Pure hypercholesterolemia, unspecified: Secondary | ICD-10-CM | POA: Diagnosis not present

## 2016-08-14 DIAGNOSIS — F329 Major depressive disorder, single episode, unspecified: Secondary | ICD-10-CM | POA: Diagnosis not present

## 2016-08-14 NOTE — Progress Notes (Signed)
Daily Session Note  Patient Details  Name: Mark MARCHUK MRN: [id number] Date of Birth: 1941-10-04 Referring Provider:   [name] Pulmonary Rehab Walk Test from 07/10/2016 in [place]  Referring Provider  [name] ([place])]      Encounter Date: 08/14/2016  Check In:     Session Check In - 08/14/16 1020      Check-In   Location [facility]   Staff Present Rosebud Poles, RN, BSN;Farrell Pantaleo, MS, ACSM RCEP, Exercise Physiologist;Portia Rollene Rotunda, RN, Roque Cash, RN   Supervising physician immediately available to respond to emergencies [facility] immediately available   Physician(s) Dr. Lonny Prude   Medication changes reported     No   Fall or balance concerns reported    No   Warm-up and Cool-down Performed as group-led instruction   Resistance Training Performed Yes   VAD Patient? No     Pain Assessment   Currently in Pain? No/denies   Multiple Pain Sites No      Capillary Blood Glucose: No results found for this or any previous visit (from the past 24 hour(s)).      Exercise Prescription Changes - 08/14/16 1300      Response to Exercise   Blood Pressure (Admit) 162/80   Blood Pressure (Exercise) 186/80  left 181/91 right 190/93 180/90 manual   Blood Pressure (Exit) 170/88   Heart Rate (Admit) 89 bpm   Heart Rate (Exercise) 112 bpm   Heart Rate (Exit) 91 bpm   Oxygen Saturation (Admit) 89 %   Oxygen Saturation (Exercise) 112 %   Oxygen Saturation (Exit) 91 %   Rating of Perceived Exertion (Exercise) 11   Perceived Dyspnea (Exercise) 1   Duration Progress to 45 minutes of aerobic exercise without signs/symptoms of physical distress   Intensity THRR unchanged     Resistance Training   Training Prescription Yes   Weight GREEN BAND   Reps 10-12  10 minutes of strength training     Interval Training   Interval Training No     Oxygen   Oxygen Continuous   Liters 2     NuStep   Level 3    Minutes 17   METs 1.9     Track   Laps 11   Minutes 17     Goals Met:  Exercise tolerated well No report of cardiac concerns or symptoms Strength training completed today  Goals Unmet:  BP  Comments: Service time is from 10:30am to 12:30pm    Dr. Rush Farmer is Medical Director for Pulmonary Rehab at [facility].

## 2016-08-19 ENCOUNTER — Encounter (HOSPITAL_COMMUNITY)
Admission: RE | Admit: 2016-08-19 | Discharge: 2016-08-19 | Disposition: A | Discharge status: Home / Self Care | Payer: Non-veteran care | Source: Ambulatory Visit | Attending: Pulmonary Disease | Admitting: Pulmonary Disease

## 2016-08-19 VITALS — Wt 232.8 lb

## 2016-08-19 DIAGNOSIS — J438 Other emphysema: Secondary | ICD-10-CM

## 2016-08-19 NOTE — Progress Notes (Signed)
Pulmonary Individual Treatment Plan  Patient Details  Name: Mark Robertson MRN: [id number] Date of Birth: January 11, 1941 Referring Provider:   [name] Pulmonary Rehab Walk Test from 07/10/2016 in [place]  Referring Provider  [name] ([place])]      Initial Encounter Date:  Flowsheet Row Pulmonary Rehab Walk Test from 07/10/2016 in [place]  Date  07/10/16  Referring Provider  Dr.Yacoub Gwenette Greet ([place])]      Visit Diagnosis: Other emphysema ([place])  Patient's Home Medications on Admission:   Current Outpatient Prescriptions:  .  amoxicillin (AMOXIL) 500 MG tablet, Take 2,000 mg by mouth once as needed (He is to take prior to dental procedures.)., Disp: , Rfl:  .  aspirin EC 81 MG tablet, Take 81 mg by mouth every evening., Disp: , Rfl:  .  BREO ELLIPTA 100-25 MCG/INH AEPB, Inhale 1 puff into the lungs daily. , Disp: , Rfl:  .  Cholecalciferol (VITAMIN D) 2000 UNITS tablet, Take 2,000 Units by mouth daily., Disp: , Rfl:  .  flunisolide (NASALIDE) 0.025 % SOLN, Inhale 1 spray into the lungs daily as needed (For allergies.). , Disp: , Rfl:  .  gabapentin (NEURONTIN) 300 MG capsule, Take 300 mg by mouth 2 (two) times daily., Disp: , Rfl:  .  losartan (COZAAR) 50 MG tablet, Take 50 mg by mouth daily with breakfast. , Disp: , Rfl:  .  Menthol, Topical Analgesic, (ICY HOT EX), Apply 1 application topically daily as needed (For pain.)., Disp: , Rfl:  .  Multiple Vitamins-Minerals (CENTRUM SILVER) tablet, Take 1 tablet by mouth daily with breakfast., Disp: , Rfl:  .  naproxen sodium (ANAPROX) 220 MG tablet, Take 220-440 mg by mouth 2 (two) times daily with a meal. He takes two tablets in the morning and one tablet in the evening., Disp: , Rfl:  .  Polyethyl Glycol-Propyl Glycol (SYSTANE) 0.4-0.3 % SOLN, Place 1-2 drops into both eyes daily as needed (For dry eyes.)., Disp: , Rfl:  .  ranitidine (ZANTAC) 150 MG tablet, Take  150 mg by mouth daily., Disp: , Rfl:  .  rosuvastatin (CRESTOR) 10 MG tablet, Take 10 mg by mouth every evening., Disp: , Rfl:  .  Sildenafil Citrate (VIAGRA PO), Take 1 tablet by mouth daily as needed (For erectile dysfunction.)., Disp: , Rfl:  .  traZODone (DESYREL) 50 MG tablet, Take 50 mg by mouth at bedtime., Disp: , Rfl:   Past Medical History: Past Medical History:  Diagnosis Date  . Cavitary pneumonia 09/29/2014  . COPD (chronic obstructive pulmonary disease) ([place])   . Depression   . Esophageal dysmotility 09/30/2014  . Esophageal thickening 09/29/2014  . Hiatal hernia 09/30/2014  . Hypercholesteremia   . Hypertension     Tobacco Use: History  Smoking Status  . Former Smoker  . Packs/day: 2.00  . Years: 50.00  . Types: Cigarettes  . Quit date: 10/13/2001  Smokeless Tobacco  . Not on file    Labs: Recent Review Flowsheet Data    Labs for ITP Cardiac and Pulmonary Rehab Latest Ref Rng & Units 09/29/2014   TCO2 0 - 100 mmol/L 33      Capillary Blood Glucose: No results found for: GLUCAP   ADL UCSD:     Pulmonary Assessment Scores    Row Name 07/08/16 1505         ADL UCSD   ADL Phase Entry     SOB Score total 107  Pulmonary Function Assessment:     Pulmonary Function Assessment - 07/04/16 1030      Breath   Bilateral Breath Sounds Clear   Shortness of Breath No  resolved with Breo      Exercise Target Goals:    Exercise Program Goal: Individual exercise prescription set with THRR, safety & activity barriers. Participant demonstrates ability to understand and report RPE using BORG scale, to self-measure pulse accurately, and to acknowledge the importance of the exercise prescription.  Exercise Prescription Goal: Starting with aerobic activity 30 plus minutes a day, 3 days per week for initial exercise prescription. Provide home exercise prescription and guidelines that participant acknowledges understanding prior to  discharge.  Activity Barriers & Risk Stratification:     Activity Barriers & Cardiac Risk Stratification - 07/04/16 1030      Activity Barriers & Cardiac Risk Stratification   Activity Barriers Left Knee Replacement;Right Knee Replacement;Deconditioning;Shortness of Breath;Neck/Spine Problems      6 Minute Walk:     6 Minute Walk    Row Name 07/10/16 1601         6 Minute Walk   Phase Initial     Distance 800 feet     Walk Time 6 minutes     # of Rest Breaks 0     MPH 1.5     METS 2.15     RPE 11     Perceived Dyspnea  0     Symptoms No     Resting HR 81 bpm     Resting BP 140/82     Max Ex. HR 98 bpm     Max Ex. BP 164/80     2 Minute Post BP 140/84       Interval HR   Baseline HR 81     1 Minute HR 86     2 Minute HR 97     3 Minute HR 98     4 Minute HR 93     5 Minute HR 94     6 Minute HR 112     2 Minute Post HR 90     Interval Heart Rate? Yes       Interval Oxygen   Interval Oxygen? Yes     Baseline Oxygen Saturation % 92 %     Baseline Liters of Oxygen 0 L     1 Minute Oxygen Saturation % 85 %     1 Minute Liters of Oxygen 0 L     2 Minute Oxygen Saturation % 94 %     2 Minute Liters of Oxygen 2 L     3 Minute Oxygen Saturation % 90 %     3 Minute Liters of Oxygen 2 L     4 Minute Oxygen Saturation % 93 %     4 Minute Liters of Oxygen 2 L     5 Minute Oxygen Saturation % 92 %     5 Minute Liters of Oxygen 2 L     6 Minute Oxygen Saturation % 96 %     6 Minute Liters of Oxygen 2 L     2 Minute Post Oxygen Saturation % 98 %     2 Minute Post Liters of Oxygen 2 L        Initial Exercise Prescription:     Initial Exercise Prescription - 07/10/16 1600      Date of Initial Exercise RX and Referring Provider   Date 07/10/16  Referring Provider Dr.Yacoub  Clance ([place])     Oxygen   Oxygen Continuous   Liters 2     Bike   Level 0.5   Minutes 17     NuStep   Level 2   Minutes 17   METs 1.5     Track   Laps 5   Minutes 17      Prescription Details   Frequency (times per week) 2   Duration Progress to 45 minutes of aerobic exercise without signs/symptoms of physical distress     Intensity   THRR 40-80% of Max Heartrate 58-116   Ratings of Perceived Exertion 11-13   Perceived Dyspnea 0-4     Progression   Progression Continue progressive overload as per policy without signs/symptoms or physical distress.     Resistance Training   Training Prescription Yes   Weight GREEN BAND   Reps 10-12      Perform Capillary Blood Glucose checks as needed.  Exercise Prescription Changes:     Exercise Prescription Changes    Row Name 07/31/16 1200 08/05/16 1200 08/07/16 1313 08/14/16 1300       Response to Exercise   Blood Pressure (Admit) 168/84 150/76 150/76 162/80    Blood Pressure (Exercise) 154/80 154/76 200/84  reck 190/93 186/80  left 181/91 right 190/93 180/90 manual    Blood Pressure (Exit) 142/66 144/72 152/86 170/88    Heart Rate (Admit) 88 bpm 80 bpm 81 bpm 89 bpm    Heart Rate (Exercise) 106 bpm 103 bpm 116 bpm 112 bpm    Heart Rate (Exit) 85 bpm 88 bpm 88 bpm 91 bpm    Oxygen Saturation (Admit) 96 % 98 % 94 % 89 %    Oxygen Saturation (Exercise) 93 % 95 % 95 % 112 %    Oxygen Saturation (Exit) 97 % 93 % 94 % 91 %    Rating of Perceived Exertion (Exercise) '11 9 11 11    ' Perceived Dyspnea (Exercise) 1 0 1 1    Duration Progress to 45 minutes of aerobic exercise without signs/symptoms of physical distress Progress to 45 minutes of aerobic exercise without signs/symptoms of physical distress Progress to 45 minutes of aerobic exercise without signs/symptoms of physical distress Progress to 45 minutes of aerobic exercise without signs/symptoms of physical distress    Intensity -  40-80% of HRR THRR unchanged THRR unchanged THRR unchanged      Resistance Training   Training Prescription Yes Yes Yes Yes    Weight GREEN BAND GREEN BAND GREEN BAND GREEN BAND    Reps 10-12  10 minutes of strength  training 10-12  10 minutes of strength training 10-12  10 minutes of strength training 10-12  10 minutes of strength training      Interval Training   Interval Training No No No No      Oxygen   Oxygen Continuous Continuous Continuous Continuous    Liters  - '2 2 2      ' Bike   Level 0.5 1  -  -    Minutes 17 17  -  -      NuStep   Level 2 3  - 3    Minutes 17 17  - 17    METs 1.9 2.5  - 1.9      Track   Laps  - -  met with nutritionist during this station 13 11    Minutes  - 42 68 34  Exercise Comments:     Exercise Comments    Row Name 08/18/16 1627           Exercise Comments Patient's blood pressure have been out of control. Has only attended four entire sessions. Blood pressure has been addressed by the doctor and medicines have been adjusted. Will cont. to monitor.           Discharge Exercise Prescription (Final Exercise Prescription Changes):     Exercise Prescription Changes - 08/14/16 1300      Response to Exercise   Blood Pressure (Admit) 162/80   Blood Pressure (Exercise) 186/80  left 181/91 right 190/93 180/90 manual   Blood Pressure (Exit) 170/88   Heart Rate (Admit) 89 bpm   Heart Rate (Exercise) 112 bpm   Heart Rate (Exit) 91 bpm   Oxygen Saturation (Admit) 89 %   Oxygen Saturation (Exercise) 112 %   Oxygen Saturation (Exit) 91 %   Rating of Perceived Exertion (Exercise) 11   Perceived Dyspnea (Exercise) 1   Duration Progress to 45 minutes of aerobic exercise without signs/symptoms of physical distress   Intensity THRR unchanged     Resistance Training   Training Prescription Yes   Weight GREEN BAND   Reps 10-12  10 minutes of strength training     Interval Training   Interval Training No     Oxygen   Oxygen Continuous   Liters 2     NuStep   Level 3   Minutes 17   METs 1.9     Track   Laps 11   Minutes 17       Nutrition:  Target Goals: Understanding of nutrition guidelines, daily intake of sodium <1534m,  cholesterol <2070m calories 30% from fat and 7% or less from saturated fats, daily to have 5 or more servings of fruits and vegetables.  Biometrics:     Pre Biometrics - 07/10/16 1619      Pre Biometrics   Grip Strength 36 kg       Nutrition Therapy Plan and Nutrition Goals:     Nutrition Therapy & Goals - 08/05/16 1231      Nutrition Therapy   Diet General, Healthful      Personal Nutrition Goals   Personal Goal #1 1-2 lb wt loss/week to a wt loss goal of 6-24 lb [place] individualized specific dietary modifications aiming towards targeted core components such as weight, hypertension, lipid management, diabetes, heart failure and other comorbidities.   Expected Outcomes Short Term Goal: Understand basic principles of dietary content, such as calories, fat, sodium, cholesterol and nutrients.;Long Term Goal: Adherence to prescribed nutrition plan.      Nutrition Discharge: Rate Your Plate Scores:     Nutrition Assessments - 08/05/16 1231      Rate Your Plate Scores   Pre Score 54      Psychosocial: Target Goals: Acknowledge presence or absence of depression, maximize coping skills, provide positive support system. Participant is able to verbalize types and ability to use techniques and skills needed for reducing stress and depression.  Initial Review & Psychosocial Screening:     Initial Psych Review & Screening - 07/04/16 1036      Initial Review   Current issues with History of Depression     Family Dynamics   Good Support System? Yes     Barriers   Psychosocial barriers to participate  in program Psychosocial barriers identified (see note)     Screening Interventions   Interventions Encouraged to exercise      Quality of Life Scores:     Quality of Life - 07/08/16 1505      Quality of Life Scores   Health/Function Pre 20.8 %   Socioeconomic  Pre 13.5 %   Psych/Spiritual Pre 23 %   Family Pre 21 %   GLOBAL Pre 19.45 %      PHQ-9: Recent Review Flowsheet Data    Depression screen [facility] 2/9 07/04/2016   Decreased Interest 0   Down, Depressed, Hopeless 0   PHQ - 2 Score 0      Psychosocial Evaluation and Intervention:     Psychosocial Evaluation - 07/04/16 1145      Psychosocial Evaluation & Interventions   Interventions Encouraged to exercise with the program and follow exercise prescription      Psychosocial Re-Evaluation:     Psychosocial Re-Evaluation    [place] Name 08/19/16 0650             Psychosocial Re-Evaluation   Interventions Encouraged to attend Pulmonary Rehabilitation for the exercise       Comments see comments on ITP         Education: Education Goals: Education classes will be provided on a weekly basis, covering required topics. Participant will state understanding/return demonstration of topics presented.  Learning Barriers/Preferences:     Learning Barriers/Preferences - 07/04/16 1030      Learning Barriers/Preferences   Learning Barriers None   Learning Preferences Computer/Internet;Group Instruction;Individual Instruction;Skilled Demonstration;Verbal Instruction;Written Material;Video      Education Topics: Risk Factor Reduction:  -Group instruction that is supported by a PowerPoint presentation. Instructor discusses the definition of a risk factor, different risk factors for pulmonary disease, and how the heart and lungs work together.     Nutrition for Pulmonary Patient:  -Group instruction provided by PowerPoint slides, verbal discussion, and written materials to support subject matter. The instructor gives an explanation and review of healthy diet recommendations, which includes a discussion on weight management, recommendations for fruit and vegetable consumption, as well as protein, fluid, caffeine, fiber, sodium, sugar, and alcohol. Tips for eating when patients are short  of breath are discussed.   Pursed Lip Breathing:  -Group instruction that is supported by demonstration and informational handouts. Instructor discusses the benefits of pursed lip and diaphragmatic breathing and detailed demonstration on how to preform both.     Oxygen Safety:  -Group instruction provided by PowerPoint, verbal discussion, and written material to support subject matter. There is an overview of "What is Oxygen" and "Why do we need it".  Instructor also reviews how to create a safe environment for oxygen use, the importance of using oxygen as prescribed, and the risks of noncompliance. There is a brief discussion on traveling with oxygen and resources the patient may utilize. Flowsheet Row PULMONARY REHAB OTHER RESPIRATORY from 08/14/2016 in [place]  Date  08/14/16  Educator  RN  Instruction Review Code  2- meets goals/outcomes      Oxygen Equipment:  -Group instruction provided by [facility] Staff utilizing handouts, written materials, and equipment demonstrations.   Signs and Symptoms:  -Group instruction provided by written material and verbal discussion to support subject matter. Warning signs and symptoms of infection, stroke, and heart attack are reviewed and when to call the physician/911 reinforced. Tips for preventing the spread of infection discussed. [place]  OTHER RESPIRATORY from 08/14/2016 in [place]  Date  07/31/16  Educator  video  Instruction Review Code  2- meets goals/outcomes      Advanced Directives:  -Group instruction provided by verbal instruction and written material to support subject matter. Instructor reviews Advanced Directive laws and proper instruction for filling out document.   Pulmonary Video:  -Group video education that reviews the importance of medication and oxygen compliance, exercise, good nutrition, pulmonary hygiene, and pursed lip and  diaphragmatic breathing for the pulmonary patient.   Exercise for the Pulmonary Patient:  -Group instruction that is supported by a PowerPoint presentation. Instructor discusses benefits of exercise, core components of exercise, frequency, duration, and intensity of an exercise routine, importance of utilizing pulse oximetry during exercise, safety while exercising, and options of places to exercise outside of rehab.     Pulmonary Medications:  -Verbally interactive group education provided by instructor with focus on inhaled medications and proper administration.   Anatomy and Physiology of the Respiratory System and Intimacy:  -Group instruction provided by PowerPoint, verbal discussion, and written material to support subject matter. Instructor reviews respiratory cycle and anatomical components of the respiratory system and their functions. Instructor also reviews differences in obstructive and restrictive respiratory diseases with examples of each. Intimacy, Sex, and Sexuality differences are reviewed with a discussion on how relationships can change when diagnosed with pulmonary disease. Common sexual concerns are reviewed.   Knowledge Questionnaire Score:     Knowledge Questionnaire Score - 07/08/16 1505      Knowledge Questionnaire Score   Pre Score 8/13      Core Components/Risk Factors/Patient Goals at Admission:     Personal Goals and Risk Factors at Admission - 07/04/16 1142      Core Components/Risk Factors/Patient Goals on Admission   Stress --  Patient feels he is not stressed however he verbalizes multiple times how hard it is to raise a teenage boy at his age.      Core Components/Risk Factors/Patient Goals Review:      Goals and Risk Factor Review    Row Name 08/19/16 [phone]             Core Components/Risk Factors/Patient Goals Review   Personal Goals Review Weight Management/Obesity;Increase Strength and Stamina;Increase knowledge of respiratory  medications and ability to use respiratory devices properly.;Develop more efficient breathing techniques such as purse lipped breathing and diaphragmatic breathing and practicing self-pacing with activity.;Stress       Review see comments section on ITP       Expected Outcomes see admission expected outcomes          Core Components/Risk Factors/Patient Goals at Discharge (Final Review):      Goals and Risk Factor Review - 08/19/16 0649      Core Components/Risk Factors/Patient Goals Review   Personal Goals Review Weight Management/Obesity;Increase Strength and Stamina;Increase knowledge of respiratory medications and ability to use respiratory devices properly.;Develop more efficient breathing techniques such as purse lipped breathing and diaphragmatic breathing and practicing self-pacing with activity.;Stress   Review see comments section on ITP   Expected Outcomes see admission expected outcomes      ITP Comments:   Comments: ITP REVIEW Pt is not making expected progress toward pulmonary rehab goals after completing 6 sessions. He has only exercised 4 complete days out of the 6 attended. He has uncontrolled hypertension and has been referred to his primary MD x 2 for medication adjustments. He verbalizes a significant amount  of stress in his personal life. He is raising his 35 year old grandson by himself. Just last week his daughter "showed up on my front door step with clothes in hand". She has a history of substance abuse and according to patient only returns to his home when she is out of money. It is felt his stress is contributing to his increased BP. Will continue to monitor closely and adjust workloads as tolerated. Recommend continued exercise, life style modification, education, and utilization of breathing techniques to increase stamina and strength and decrease shortness of breath with exertion.

## 2016-08-19 NOTE — Progress Notes (Signed)
Daily Session Note  Patient Details  Name: Mark Robertson MRN: [id number] Date of Birth: 09-10-1941 Referring Provider:   [name] Pulmonary Rehab Walk Test from 07/10/2016 in [place]  Referring Provider  [name] ([place])]      Encounter Date: 08/19/2016  Check In:     Session Check In - 08/19/16 1022      Check-In   Location [facility]   Staff Present Rosebud Poles, RN, BSN;Molly diVincenzo, MS, ACSM RCEP, Exercise Physiologist;Ifeanyichukwu Wickham Ysidro Evert, RN;Portia Rollene Rotunda, RN, BSN   Supervising physician immediately available to respond to emergencies [facility] immediately available   Physician(s) Dr. Lonny Prude   Medication changes reported     No   Fall or balance concerns reported    No   Warm-up and Cool-down Performed as group-led instruction   Resistance Training Performed Yes   VAD Patient? No     Pain Assessment   Currently in Pain? No/denies   Multiple Pain Sites No      Capillary Blood Glucose: No results found for this or any previous visit (from the past 24 hour(s)).      Exercise Prescription Changes - 08/19/16 1200      Exercise Review   Progression Yes     Response to Exercise   Blood Pressure (Admit) 150/60   Blood Pressure (Exercise) 180/80   Blood Pressure (Exit) 142/62   Heart Rate (Admit) 85 bpm   Heart Rate (Exercise) 130 bpm   Heart Rate (Exit) 105 bpm   Oxygen Saturation (Admit) 94 %   Oxygen Saturation (Exercise) 94 %   Oxygen Saturation (Exit) 95 %   Rating of Perceived Exertion (Exercise) 11   Perceived Dyspnea (Exercise) 1   Duration Progress to 45 minutes of aerobic exercise without signs/symptoms of physical distress   Intensity THRR unchanged     Resistance Training   Training Prescription Yes   Weight green bands   Reps 10-12     Interval Training   Interval Training No     Oxygen   Oxygen Continuous   Liters 2     Bike   Level 1   Minutes 17     NuStep   Level 4   Minutes 17   METs 2.8     Track   Laps 12   Minutes 17     Goals Met:  Exercise tolerated well No report of cardiac concerns or symptoms Strength training completed today  Goals Unmet:  Not Applicable  Comments: Service time is from 1030 to 1205    Dr. Rush Farmer is Medical Director for Pulmonary Rehab at [facility].

## 2016-08-21 ENCOUNTER — Encounter (HOSPITAL_COMMUNITY)
Admission: RE | Admit: 2016-08-21 | Discharge: 2016-08-21 | Disposition: A | Discharge status: Home / Self Care | Payer: Non-veteran care | Source: Ambulatory Visit | Attending: Pulmonary Disease | Admitting: Pulmonary Disease

## 2016-08-21 VITALS — Wt 233.9 lb

## 2016-08-21 DIAGNOSIS — J438 Other emphysema: Secondary | ICD-10-CM | POA: Diagnosis not present

## 2016-08-21 NOTE — Progress Notes (Signed)
Daily Session Note  Patient Details  Name: Mark Robertson MRN: [id number] Date of Birth: [birth year] Referring Provider:   [name] Pulmonary Rehab Walk Test from 07/10/2016 in [place]  Referring Provider  [name] ([place])]      Encounter Date: 08/21/2016  Check In:     Session Check In - 08/21/16 1030      Check-In   Location [facility]   Staff Present Rosebud Poles, RN, BSN;Kedar Sedano, MS, ACSM RCEP, Exercise Physiologist;Lisa Ysidro Evert, Felipe Drone, RN, MHA;Portia Rollene Rotunda, RN, BSN   Supervising physician immediately available to respond to emergencies [facility] immediately available   Physician(s) Dr. Tana Coast   Medication changes reported     No   Fall or balance concerns reported    No   Warm-up and Cool-down Performed as group-led instruction   Resistance Training Performed Yes   VAD Patient? No     Pain Assessment   Currently in Pain? No/denies   Multiple Pain Sites No      Capillary Blood Glucose: No results found for this or any previous visit (from the past 24 hour(s)).      Exercise Prescription Changes - 08/21/16 1300      Response to Exercise   Blood Pressure (Admit) 154/60   Blood Pressure (Exercise) 160/76   Blood Pressure (Exit) 138/72   Heart Rate (Admit) 80 bpm   Heart Rate (Exercise) 122 bpm   Heart Rate (Exit) 100 bpm   Oxygen Saturation (Admit) 95 %   Oxygen Saturation (Exercise) 89 %   Oxygen Saturation (Exit) 95 %   Rating of Perceived Exertion (Exercise) 11   Perceived Dyspnea (Exercise) 1   Duration Progress to 45 minutes of aerobic exercise without signs/symptoms of physical distress   Intensity THRR unchanged     Resistance Training   Training Prescription Yes   Weight green bands   Reps 10-12     Interval Training   Interval Training No     Oxygen   Oxygen Continuous   Liters 2     NuStep   Level 4   Minutes 17   METs 3.5     Track   Laps  14   Minutes 17     Goals Met:  Exercise tolerated well No report of cardiac concerns or symptoms Strength training completed today  Goals Unmet:  Not Applicable  Comments: Service time is from 10:30am to 12:30pm    Dr. Rush Farmer is Medical Director for Pulmonary Rehab at [facility].

## 2016-08-26 ENCOUNTER — Encounter (HOSPITAL_COMMUNITY)
Admission: RE | Admit: 2016-08-26 | Discharge: 2016-08-26 | Disposition: A | Discharge status: Home / Self Care | Payer: Non-veteran care | Source: Ambulatory Visit | Attending: Pulmonary Disease | Admitting: Pulmonary Disease

## 2016-08-26 VITALS — Wt 233.0 lb

## 2016-08-26 DIAGNOSIS — J438 Other emphysema: Secondary | ICD-10-CM

## 2016-08-26 NOTE — Progress Notes (Signed)
Daily Session Note  Patient Details  Name: Mark Robertson MRN: [id number] Date of Birth: 01/24/1941 Referring Provider:   [name] Pulmonary Rehab Walk Test from 07/10/2016 in [place]  Referring Provider  [name] ([place])]      Encounter Date: 08/26/2016  Check In:     Session Check In - 08/26/16 1013      Check-In   Location [facility]   Staff Present Rosebud Poles, RN, BSN;Molly diVincenzo, MS, ACSM RCEP, Exercise Physiologist;Lisa Ysidro Evert, RN   Supervising physician immediately available to respond to emergencies [facility] immediately available   Physician(s) Dr. Tana Coast   Medication changes reported     No   Fall or balance concerns reported    No   Warm-up and Cool-down Performed as group-led instruction   Resistance Training Performed Yes   VAD Patient? No     Pain Assessment   Currently in Pain? No/denies   Multiple Pain Sites No      Capillary Blood Glucose: No results found for this or any previous visit (from the past 24 hour(s)).      Exercise Prescription Changes - 08/26/16 1200      Exercise Review   Progression Yes     Response to Exercise   Blood Pressure (Admit) 154/64   Blood Pressure (Exercise) 166/84   Blood Pressure (Exit) 142/62   Heart Rate (Admit) 80 bpm   Heart Rate (Exercise) 122 bpm   Heart Rate (Exit) 100 bpm   Oxygen Saturation (Admit) 92 %   Oxygen Saturation (Exercise) 92 %   Oxygen Saturation (Exit) 94 %   Rating of Perceived Exertion (Exercise) 11   Perceived Dyspnea (Exercise) 2   Duration Progress to 45 minutes of aerobic exercise without signs/symptoms of physical distress   Intensity THRR unchanged     Resistance Training   Training Prescription Yes   Weight green bands   Reps 10-12  10 minutes of sttength training     Interval Training   Interval Training No     Oxygen   Oxygen Continuous   Liters 2     Bike   Level 1   Minutes 17     NuStep   Level 5   Minutes 17   METs 3.3     Track   Laps 16   Minutes 17     Goals Met:  Exercise tolerated well Strength training completed today  Goals Unmet:  Not Applicable  Comments: Service time is from 1030 to 1205    Dr. Rush Farmer is Medical Director for Pulmonary Rehab at [facility].

## 2016-08-28 ENCOUNTER — Encounter (HOSPITAL_COMMUNITY)
Admission: RE | Admit: 2016-08-28 | Discharge: 2016-08-28 | Disposition: A | Discharge status: Home / Self Care | Payer: Non-veteran care | Source: Ambulatory Visit | Attending: Pulmonary Disease | Admitting: Pulmonary Disease

## 2016-08-28 DIAGNOSIS — J438 Other emphysema: Secondary | ICD-10-CM

## 2016-08-28 NOTE — Progress Notes (Signed)
Daily Session Note  Patient Details  Name: Mark Robertson MRN: [id number] Date of Birth: December 27, 1940 Referring Provider:   [name] Pulmonary Rehab Walk Test from 07/10/2016 in [place]  Referring Provider  [name] ([place])]      Encounter Date: 08/28/2016  Check In:     Session Check In - 08/28/16 1017      Check-In   [facility]   Staff Present Su Hilt, MS, ACSM RCEP, Exercise Physiologist;Lisa Ysidro Evert, RN;Portia [place], RN, Maxcine Ham, RN, BSN   Supervising physician immediately available to respond to emergencies [facility] immediately available   Physician(s) Dr. Grandville Silos   Medication changes reported     No   Fall or balance concerns reported    No   Warm-up and Cool-down Performed as group-led instruction   Resistance Training Performed Yes   VAD Patient? No     Pain Assessment   Currently in Pain? No/denies   Multiple Pain Sites No      Capillary Blood Glucose: No results found for this or any previous visit (from the past 24 hour(s)).      Exercise Prescription Changes - 08/28/16 1200      Response to Exercise   Blood Pressure (Admit) 160/70   Blood Pressure (Exercise) 220/90   Blood Pressure (Exit) 136/74   Heart Rate (Admit) 98 bpm   Heart Rate (Exercise) 111 bpm   Heart Rate (Exit) 93 bpm   Oxygen Saturation (Admit) 91 %   Oxygen Saturation (Exercise) 96 %   Oxygen Saturation (Exit) 95 %   Rating of Perceived Exertion (Exercise) 11   Perceived Dyspnea (Exercise) 2   Duration Progress to 45 minutes of aerobic exercise without signs/symptoms of physical distress   Intensity THRR unchanged     Resistance Training   Training Prescription Yes   Weight green bands   Reps 10-12  10 minutes of sttength training     Interval Training   Interval Training No     Oxygen   Oxygen Continuous   Liters 2     Bike   Level 1   Minutes 17     NuStep   Level 5    Minutes 17   METs 2.5     Goals Met:  Exercise tolerated well No report of cardiac concerns or symptoms Strength training completed today  Goals Unmet:  Not Applicable  Comments: Service time is from 10:30am to 12:20pm    Dr. Rush Farmer is Medical Director for Pulmonary Rehab at [facility].

## 2016-09-02 ENCOUNTER — Encounter (HOSPITAL_COMMUNITY)
Admission: RE | Admit: 2016-09-02 | Discharge: 2016-09-02 | Disposition: A | Discharge status: Home / Self Care | Payer: Non-veteran care | Source: Ambulatory Visit | Attending: Pulmonary Disease | Admitting: Pulmonary Disease

## 2016-09-02 VITALS — Wt 235.2 lb

## 2016-09-02 DIAGNOSIS — J438 Other emphysema: Secondary | ICD-10-CM

## 2016-09-02 NOTE — Progress Notes (Signed)
Daily Session Note  Patient Details  Name: Mark Robertson MRN: [id number] Date of Birth: 09/26/1941 Referring Provider:   Flowsheet Row Pulmonary Rehab Walk Test from 07/10/2016 in [facility]  Referring Provider  Dr.Yacoub [[place] ([place]      Encounter Date: 09/02/2016  Check In:     Session Check In - 09/02/16 1030      Check-In   Location [facility]   Staff Present Joan Behrens, RN, BSN; , MS, ACSM RCEP, Exercise Physiologist;Lisa Hughes, RN;Portia Payne, RN, BSN   Supervising physician immediately available to respond to emergencies [facility] immediately available   Physician(s) Dr. Thompson   Medication changes reported     No   Fall or balance concerns reported    No   Warm-up and Cool-down Performed as group-led instruction   Resistance Training Performed Yes   VAD Patient? No     Pain Assessment   Currently in Pain? No/denies   Multiple Pain Sites No      Capillary Blood Glucose: No results found for this or any previous visit (from the past 24 hour(s)).      Exercise Prescription Changes - 09/02/16 1100      Response to Exercise   Blood Pressure (Admit) 150/74   Blood Pressure (Exercise) 166/80   Blood Pressure (Exit) 146/76   Heart Rate (Admit) 106 bpm   Heart Rate (Exercise) 113 bpm   Heart Rate (Exit) 105 bpm   Oxygen Saturation (Admit) 97 %   Oxygen Saturation (Exercise) 94 %   Oxygen Saturation (Exit) 94 %   Rating of Perceived Exertion (Exercise) 11   Perceived Dyspnea (Exercise) 0   Duration Progress to 45 minutes of aerobic exercise without signs/symptoms of physical distress   Intensity THRR unchanged     Resistance Training   Training Prescription Yes   Weight green bands   Reps 10-12  10 minutes of sttength training     Interval Training   Interval Training No     Oxygen   Oxygen Continuous   Liters 2     Bike   Level 1   Minutes 17     NuStep   Level 5    Minutes 17   METs 1.9     Track   Laps 14   Minutes 17     Goals Met:  Exercise tolerated well No report of cardiac concerns or symptoms Strength training completed today  Goals Unmet:  Not Applicable  Comments:  Service time is from 10:30am to 12:00pm    Dr. Wesam G. Yacoub is Medical Director for Pulmonary Rehab at [facility]. 

## 2016-09-04 ENCOUNTER — Encounter (HOSPITAL_COMMUNITY): Payer: Non-veteran care

## 2016-09-09 ENCOUNTER — Encounter (HOSPITAL_COMMUNITY)
Admission: RE | Admit: 2016-09-09 | Discharge: 2016-09-09 | Disposition: A | Discharge status: Home / Self Care | Payer: Non-veteran care | Source: Ambulatory Visit | Attending: Pulmonary Disease | Admitting: Pulmonary Disease

## 2016-09-09 VITALS — Wt 236.1 lb

## 2016-09-09 DIAGNOSIS — J438 Other emphysema: Secondary | ICD-10-CM

## 2016-09-09 DIAGNOSIS — I10 Essential (primary) hypertension: Secondary | ICD-10-CM | POA: Diagnosis not present

## 2016-09-09 NOTE — Progress Notes (Signed)
Daily Session Note  Patient Details  Name: Mark Robertson MRN: [id number] Date of Birth: 1941-07-18 Referring Provider:   [name] Pulmonary Rehab Walk Test from 07/10/2016 in [place]  Referring Provider  [name] ([place])]      Encounter Date: 09/09/2016  Check In:     Session Check In - 09/09/16 1020      Check-In   [facility]   Staff Present Rosebud Poles, RN, BSN;Molly diVincenzo, MS, ACSM RCEP, Exercise Physiologist;Lisa Ysidro Evert, RN;Portia Rollene Rotunda, RN, BSN   Supervising physician immediately available to respond to emergencies [facility] immediately available   Physician(s) Dr. Cathlean Sauer   Medication changes reported     No   Fall or balance concerns reported    No   Warm-up and Cool-down Performed as group-led instruction   Resistance Training Performed Yes   VAD Patient? No     Pain Assessment   Currently in Pain? No/denies   Multiple Pain Sites No      Capillary Blood Glucose: No results found for this or any previous visit (from the past 24 hour(s)).      Exercise Prescription Changes - 09/09/16 1200      Response to Exercise   Blood Pressure (Admit) 154/80   Blood Pressure (Exercise) 184/90   Blood Pressure (Exit) 138/64   Heart Rate (Admit) 99 bpm   Heart Rate (Exercise) 117 bpm   Heart Rate (Exit) 106 bpm   Oxygen Saturation (Admit) 92 %   Oxygen Saturation (Exercise) 95 %   Oxygen Saturation (Exit) 93 %   Rating of Perceived Exertion (Exercise) 11   Perceived Dyspnea (Exercise) 0   Duration Progress to 45 minutes of aerobic exercise without signs/symptoms of physical distress   Intensity THRR unchanged     Resistance Training   Training Prescription Yes   Weight green bands   Reps 10-12  10 minutes of strength training     Interval Training   Interval Training No     Oxygen   Oxygen Continuous   Liters 2     Bike   Level 1   Minutes 17     NuStep   Level 5   Minutes 17   METs 2.7     Track   Laps 13   Minutes 17     Goals Met:  Exercise tolerated well Strength training completed today  Goals Unmet:  Not Applicable  Comments: Service time is from 1030 to 1200    Dr. Rush Farmer is Medical Director for Pulmonary Rehab at [facility].

## 2016-09-11 ENCOUNTER — Encounter (HOSPITAL_COMMUNITY)
Admission: RE | Admit: 2016-09-11 | Discharge: 2016-09-11 | Disposition: A | Discharge status: Home / Self Care | Payer: Non-veteran care | Source: Ambulatory Visit | Attending: Pulmonary Disease | Admitting: Pulmonary Disease

## 2016-09-11 VITALS — Wt 233.7 lb

## 2016-09-11 DIAGNOSIS — J438 Other emphysema: Secondary | ICD-10-CM | POA: Diagnosis not present

## 2016-09-11 NOTE — Progress Notes (Signed)
Daily Session Note  Patient Details  Name: Mark Robertson MRN: [id number] Date of Birth: 1940-12-26 Referring Provider:   [name] Pulmonary Rehab Walk Test from 07/10/2016 in [place]  Referring Provider  [name] ([place])]      Encounter Date: 09/11/2016  Check In:     Session Check In - 09/11/16 1023      Check-In   Location [facility]   Staff Present Rosebud Poles, RN, BSN;Lisa Ysidro Evert, RN;Molly diVincenzo, MS, ACSM RCEP, Exercise Physiologist;Portia Rollene Rotunda, RN, BSN   Supervising physician immediately available to respond to emergencies [facility] immediately available   Physician(s) Dr. Ree Kida   Medication changes reported     No   Fall or balance concerns reported    No   Warm-up and Cool-down Performed as group-led instruction   Resistance Training Performed Yes   VAD Patient? No     Pain Assessment   Currently in Pain? No/denies   Multiple Pain Sites No      Capillary Blood Glucose: No results found for this or any previous visit (from the past 24 hour(s)).      Exercise Prescription Changes - 09/11/16 1200      Response to Exercise   Blood Pressure (Admit) 160/80   Blood Pressure (Exercise) 170/90   Blood Pressure (Exit) 144/70   Heart Rate (Admit) 94 bpm   Heart Rate (Exercise) 120 bpm   Heart Rate (Exit) 102 bpm   Oxygen Saturation (Admit) 95 %   Oxygen Saturation (Exercise) 92 %   Oxygen Saturation (Exit) 92 %   Rating of Perceived Exertion (Exercise) 11   Perceived Dyspnea (Exercise) 1   Duration Progress to 45 minutes of aerobic exercise without signs/symptoms of physical distress   Intensity THRR unchanged     Progression   Progression Continue to progress workloads to maintain intensity without signs/symptoms of physical distress.     Resistance Training   Training Prescription Yes   Weight green bands   Reps 10-12  10 minutes of strength training     Interval Training    Interval Training No     Oxygen   Oxygen Continuous   Liters 2     Bike   Level 1   Minutes 17     Track   Laps 16   Minutes 17     Goals Met:  Exercise tolerated well Strength training completed today  Goals Unmet:  Not Applicable  Comments: Service time is from 1030 to 1215    Dr. Rush Farmer is Medical Director for Pulmonary Rehab at [facility].

## 2016-09-16 ENCOUNTER — Encounter (HOSPITAL_COMMUNITY)
Admission: RE | Admit: 2016-09-16 | Discharge: 2016-09-16 | Disposition: A | Discharge status: Home / Self Care | Payer: Non-veteran care | Source: Ambulatory Visit | Attending: Pulmonary Disease | Admitting: Pulmonary Disease

## 2016-09-16 VITALS — Wt 228.2 lb

## 2016-09-16 DIAGNOSIS — J449 Chronic obstructive pulmonary disease, unspecified: Secondary | ICD-10-CM | POA: Diagnosis not present

## 2016-09-16 DIAGNOSIS — F329 Major depressive disorder, single episode, unspecified: Secondary | ICD-10-CM | POA: Diagnosis not present

## 2016-09-16 DIAGNOSIS — E7800 Pure hypercholesterolemia, unspecified: Secondary | ICD-10-CM | POA: Insufficient documentation

## 2016-09-16 DIAGNOSIS — I10 Essential (primary) hypertension: Secondary | ICD-10-CM | POA: Insufficient documentation

## 2016-09-16 DIAGNOSIS — K224 Dyskinesia of esophagus: Secondary | ICD-10-CM | POA: Diagnosis not present

## 2016-09-16 DIAGNOSIS — J438 Other emphysema: Secondary | ICD-10-CM | POA: Insufficient documentation

## 2016-09-16 DIAGNOSIS — K449 Diaphragmatic hernia without obstruction or gangrene: Secondary | ICD-10-CM | POA: Diagnosis not present

## 2016-09-16 NOTE — Progress Notes (Signed)
Daily Session Note  Patient Details  Name: Mark Robertson MRN: [id number] Date of Birth: Mar 01, 1941 Referring Provider:   [name] Pulmonary Rehab Walk Test from 07/10/2016 in [place]  Referring Provider  [name] ([place])]      Encounter Date: 09/16/2016  Check In:     Session Check In - 09/16/16 1017      Check-In   Location [facility]   Staff Present Rosebud Poles, RN, BSN;Lisa Ysidro Evert, RN;Molly diVincenzo, MS, ACSM RCEP, Exercise Physiologist;Portia Rollene Rotunda, RN, BSN   Supervising physician immediately available to respond to emergencies [facility] immediately available   Physician(s) Dr. Ree Kida   Medication changes reported     No   Fall or balance concerns reported    No   Warm-up and Cool-down Performed as group-led instruction   Resistance Training Performed Yes   VAD Patient? No     Pain Assessment   Currently in Pain? No/denies   Multiple Pain Sites No      Capillary Blood Glucose: No results found for this or any previous visit (from the past 24 hour(s)).      Exercise Prescription Changes - 09/16/16 1200      Exercise Review   Progression Yes     Response to Exercise   Blood Pressure (Admit) 140/60   Blood Pressure (Exercise) 180/84   Blood Pressure (Exit) 132/70   Heart Rate (Admit) 92 bpm   Heart Rate (Exercise) 128 bpm   Heart Rate (Exit) 104 bpm   Oxygen Saturation (Admit) 96 %   Oxygen Saturation (Exercise) 93 %   Oxygen Saturation (Exit) 93 %   Rating of Perceived Exertion (Exercise) 11   Perceived Dyspnea (Exercise) 2   Duration Progress to 45 minutes of aerobic exercise without signs/symptoms of physical distress   Intensity THRR unchanged     Progression   Progression Continue to progress workloads to maintain intensity without signs/symptoms of physical distress.     Resistance Training   Training Prescription Yes   Weight green bands   Reps 10-12  10 minutes of  strength training     Interval Training   Interval Training No     Oxygen   Oxygen Continuous   Liters 2     Bike   Level 1   Minutes 17     NuStep   Level 6   Minutes 17   METs 3.4     Track   Laps 16   Minutes 17     Goals Met:  Exercise tolerated well Strength training completed today  Goals Unmet:  Not Applicable  Comments: Service time is from 1030 to 1200    Dr. Rush Farmer is Medical Director for Pulmonary Rehab at [facility].

## 2016-09-16 NOTE — Progress Notes (Signed)
Pulmonary Individual Treatment Plan  Patient Details  Name: Mark Robertson MRN: [id number] Date of Birth: 1941-09-25 Referring Provider:   [name] Pulmonary Rehab Walk Test from 07/10/2016 in [place]  Referring Provider  [name] ([place])]      Initial Encounter Date:  Flowsheet Row Pulmonary Rehab Walk Test from 07/10/2016 in [place]  Date  07/10/16  Referring Provider  Dr.Yacoub Gwenette Greet ([place])]      Visit Diagnosis: Other emphysema ([place])  Patient's Home Medications on Admission:   Current Outpatient Prescriptions:  .  amoxicillin (AMOXIL) 500 MG tablet, Take 2,000 mg by mouth once as needed (He is to take prior to dental procedures.)., Disp: , Rfl:  .  aspirin EC 81 MG tablet, Take 81 mg by mouth every evening., Disp: , Rfl:  .  BREO ELLIPTA 100-25 MCG/INH AEPB, Inhale 1 puff into the lungs daily. , Disp: , Rfl:  .  Cholecalciferol (VITAMIN D) 2000 UNITS tablet, Take 2,000 Units by mouth daily., Disp: , Rfl:  .  flunisolide (NASALIDE) 0.025 % SOLN, Inhale 1 spray into the lungs daily as needed (For allergies.). , Disp: , Rfl:  .  gabapentin (NEURONTIN) 300 MG capsule, Take 300 mg by mouth 2 (two) times daily., Disp: , Rfl:  .  losartan (COZAAR) 50 MG tablet, Take 50 mg by mouth daily with breakfast. , Disp: , Rfl:  .  Menthol, Topical Analgesic, (ICY HOT EX), Apply 1 application topically daily as needed (For pain.)., Disp: , Rfl:  .  Multiple Vitamins-Minerals (CENTRUM SILVER) tablet, Take 1 tablet by mouth daily with breakfast., Disp: , Rfl:  .  naproxen sodium (ANAPROX) 220 MG tablet, Take 220-440 mg by mouth 2 (two) times daily with a meal. He takes two tablets in the morning and one tablet in the evening., Disp: , Rfl:  .  Polyethyl Glycol-Propyl Glycol (SYSTANE) 0.4-0.3 % SOLN, Place 1-2 drops into both eyes daily as needed (For dry eyes.)., Disp: , Rfl:  .  ranitidine (ZANTAC) 150 MG tablet, Take  150 mg by mouth daily., Disp: , Rfl:  .  rosuvastatin (CRESTOR) 10 MG tablet, Take 10 mg by mouth every evening., Disp: , Rfl:  .  Sildenafil Citrate (VIAGRA PO), Take 1 tablet by mouth daily as needed (For erectile dysfunction.)., Disp: , Rfl:  .  traZODone (DESYREL) 50 MG tablet, Take 50 mg by mouth at bedtime., Disp: , Rfl:   Past Medical History: Past Medical History:  Diagnosis Date  . Cavitary pneumonia 09/29/2014  . COPD (chronic obstructive pulmonary disease) ([place])   . Depression   . Esophageal dysmotility 09/30/2014  . Esophageal thickening 09/29/2014  . Hiatal hernia 09/30/2014  . Hypercholesteremia   . Hypertension     Tobacco Use: History  Smoking Status  . Former Smoker  . Packs/day: 2.00  . Years: 50.00  . Types: Cigarettes  . Quit date: 10/13/2001  Smokeless Tobacco  . Not on file    Labs: Recent Review Flowsheet Data    Labs for ITP Cardiac and Pulmonary Rehab Latest Ref Rng & Units 09/29/2014   TCO2 0 - 100 mmol/L 33      Capillary Blood Glucose: No results found for: GLUCAP   ADL UCSD:   Pulmonary Function Assessment:     Pulmonary Function Assessment - 07/04/16 1030      Breath   Bilateral Breath Sounds Clear   Shortness of Breath No  resolved with Memory Dance  Exercise Target Goals:    Exercise Program Goal: Individual exercise prescription set with THRR, safety & activity barriers. Participant demonstrates ability to understand and report RPE using BORG scale, to self-measure pulse accurately, and to acknowledge the importance of the exercise prescription.  Exercise Prescription Goal: Starting with aerobic activity 30 plus minutes a day, 3 days per week for initial exercise prescription. Provide home exercise prescription and guidelines that participant acknowledges understanding prior to discharge.  Activity Barriers & Risk Stratification:     Activity Barriers & Cardiac Risk Stratification - 07/04/16 1030      Activity Barriers &  Cardiac Risk Stratification   Activity Barriers Left Knee Replacement;Right Knee Replacement;Deconditioning;Shortness of Breath;Neck/Spine Problems      6 Minute Walk:     6 Minute Walk    Row Name 07/10/16 1601         6 Minute Walk   Phase Initial     Distance 800 feet     Walk Time 6 minutes     # of Rest Breaks 0     MPH 1.5     METS 2.15     RPE 11     Perceived Dyspnea  0     Symptoms No     Resting HR 81 bpm     Resting BP 140/82     Max Ex. HR 98 bpm     Max Ex. BP 164/80     2 Minute Post BP 140/84       Interval HR   Baseline HR 81     1 Minute HR 86     2 Minute HR 97     3 Minute HR 98     4 Minute HR 93     5 Minute HR 94     6 Minute HR 112     2 Minute Post HR 90     Interval Heart Rate? Yes       Interval Oxygen   Interval Oxygen? Yes     Baseline Oxygen Saturation % 92 %     Baseline Liters of Oxygen 0 L     1 Minute Oxygen Saturation % 85 %     1 Minute Liters of Oxygen 0 L     2 Minute Oxygen Saturation % 94 %     2 Minute Liters of Oxygen 2 L     3 Minute Oxygen Saturation % 90 %     3 Minute Liters of Oxygen 2 L     4 Minute Oxygen Saturation % 93 %     4 Minute Liters of Oxygen 2 L     5 Minute Oxygen Saturation % 92 %     5 Minute Liters of Oxygen 2 L     6 Minute Oxygen Saturation % 96 %     6 Minute Liters of Oxygen 2 L     2 Minute Post Oxygen Saturation % 98 %     2 Minute Post Liters of Oxygen 2 L        Initial Exercise Prescription:     Initial Exercise Prescription - 07/10/16 1600      Date of Initial Exercise RX and Referring Provider   Date 07/10/16   Referring Provider Dr.Yacoub  Clance ([place])     Oxygen   Oxygen Continuous   Liters 2     Bike   Level 0.5   Minutes 17     NuStep   Level  2   Minutes 17   METs 1.5     Track   Laps 5   Minutes 17     Prescription Details   Frequency (times per week) 2   Duration Progress to 45 minutes of aerobic exercise without signs/symptoms of physical distress      Intensity   THRR 40-80% of Max Heartrate 58-116   Ratings of Perceived Exertion 11-13   Perceived Dyspnea 0-4     Progression   Progression Continue progressive overload as per policy without signs/symptoms or physical distress.     Resistance Training   Training Prescription Yes   Weight GREEN BAND   Reps 10-12      Perform Capillary Blood Glucose checks as needed.  Exercise Prescription Changes:     Exercise Prescription Changes    Row Name 07/31/16 1200 08/05/16 1200 08/07/16 1313 08/14/16 1300 08/19/16 1200     Exercise Review   Progression  -  -  -  - Yes     Response to Exercise   Blood Pressure (Admit) 168/84 150/76 150/76 162/80 150/60   Blood Pressure (Exercise) 154/80 154/76 200/84  reck 190/93 186/80  left 181/91 right 190/93 180/90 manual 180/80   Blood Pressure (Exit) 142/66 144/72 152/86 170/88 142/62   Heart Rate (Admit) 88 bpm 80 bpm 81 bpm 89 bpm 85 bpm   Heart Rate (Exercise) 106 bpm 103 bpm 116 bpm 112 bpm 130 bpm   Heart Rate (Exit) 85 bpm 88 bpm 88 bpm 91 bpm 105 bpm   Oxygen Saturation (Admit) 96 % 98 % 94 % 89 % 94 %   Oxygen Saturation (Exercise) 93 % 95 % 95 % 112 % 94 %   Oxygen Saturation (Exit) 97 % 93 % 94 % 91 % 95 %   Rating of Perceived Exertion (Exercise) _0 Perceived Dyspnea (Exercise) 1 0 _1 Duration Progress to 45 minutes of aerobic exercise without signs/symptoms of physical distress Progress to 45 minutes of aerobic exercise without signs/symptoms of physical distress Progress to 45 minutes of aerobic exercise without signs/symptoms of physical distress Progress to 45 minutes of aerobic exercise without signs/symptoms of physical distress Progress to 45 minutes of aerobic exercise without signs/symptoms of physical distress   Intensity -  40-80% of HRR THRR unchanged THRR unchanged THRR unchanged THRR unchanged     Resistance Training   Training Prescription _2    Weight GREEN BAND GREEN BAND  GREEN BAND GREEN BAND green bands   Reps 10-12  10 minutes of strength training 10-12  10 minutes of strength training 10-12  10 minutes of strength training 10-12  10 minutes of strength training 10-12     Interval Training   Interval Training _3      Oxygen   Oxygen _4    Liters  - _5 Bike   Level 0.5 1  -  - 1   Minutes 17 17  -  - 17     NuStep   Level 2 3  - 3 4   Minutes 17 17  - 17 17   METs 1.9 2.5  - 1.9 2.8     Track   Laps  - -  met with nutritionist during this station _6 Minutes  - _7 Row Name 08/21/16 1300  08/26/16 1200 08/28/16 1200 09/02/16 1100 09/09/16 1200     Exercise Review   Progression  - Yes  -  -  -     Response to Exercise   Blood Pressure (Admit) 154/60 154/64 160/70 150/74 154/80   Blood Pressure (Exercise) 160/76 166/84 220/90 166/80 184/90   Blood Pressure (Exit) 138/72 142/62 136/74 146/76 138/64   Heart Rate (Admit) 80 bpm 80 bpm 98 bpm 106 bpm 99 bpm   Heart Rate (Exercise) 122 bpm 122 bpm 111 bpm 113 bpm 117 bpm   Heart Rate (Exit) 100 bpm 100 bpm 93 bpm 105 bpm 106 bpm   Oxygen Saturation (Admit) 95 % 92 % 91 % 97 % 92 %   Oxygen Saturation (Exercise) 89 % 92 % 96 % 94 % 95 %   Oxygen Saturation (Exit) 95 % 94 % 95 % 94 % 93 %   Rating of Perceived Exertion (Exercise) _0 Perceived Dyspnea (Exercise) _1 0 0   Duration Progress to 45 minutes of aerobic exercise without signs/symptoms of physical distress Progress to 45 minutes of aerobic exercise without signs/symptoms of physical distress Progress to 45 minutes of aerobic exercise without signs/symptoms of physical distress Progress to 45 minutes of aerobic exercise without signs/symptoms of physical distress Progress to 45 minutes of aerobic exercise without signs/symptoms of physical distress   Intensity _2       Resistance Training   Training Prescription _3    Weight _4    Reps 10-12 10-12  10 minutes of sttength training 10-12  10 minutes of sttength training 10-12  10 minutes of sttength training 10-12  10 minutes of strength training     Interval Training   Interval Training _5      Oxygen   Oxygen _6    Liters _7 Bike   Level  - _8 Minutes  - _9 NuStep   Level _10 Minutes _11 METs 3.5 3.3 2.5 1.9 2.7     Track   Laps 14 16  - 14 13   Minutes 17 17  - 17 17   Row Name 09/11/16 1200             Response to Exercise   Blood Pressure (Admit) 160/80       Blood Pressure (Exercise) 170/90       Blood Pressure (Exit) 144/70       Heart Rate (Admit) 94 bpm       Heart Rate (Exercise) 120 bpm       Heart Rate (Exit) 102 bpm       Oxygen Saturation (Admit) 95 %       Oxygen Saturation (Exercise) 92 %       Oxygen Saturation (Exit) 92 %       Rating of Perceived Exertion (Exercise) 11       Perceived Dyspnea (Exercise) 1       Duration Progress to 45 minutes of aerobic exercise without signs/symptoms of physical distress       Intensity THRR unchanged         Progression   Progression Continue to  progress workloads to maintain intensity without signs/symptoms of physical distress.         Resistance Training   Training Prescription Yes       Weight green bands       Reps 10-12  10 minutes of strength training         Interval Training   Interval Training No         Oxygen   Oxygen Continuous       Liters 2         Bike   Level 1       Minutes 17         Track   Laps 16       Minutes 17          Exercise Comments:     Exercise Comments    Row Name 08/18/16 1627 09/15/16 0956         Exercise Comments Patient's blood pressure have been out of control. Has only  attended four entire sessions. Blood pressure has been addressed by the doctor and medicines have been adjusted. Will cont. to monitor.  Patient's blood pressure is still out of control--Dr. has readjusted his medicine again. Will continue to monitor. Patient is improving slowly. Will discuss home exercise in the next week or two depending on BP,         Discharge Exercise Prescription (Final Exercise Prescription Changes):     Exercise Prescription Changes - 09/11/16 1200      Response to Exercise   Blood Pressure (Admit) 160/80   Blood Pressure (Exercise) 170/90   Blood Pressure (Exit) 144/70   Heart Rate (Admit) 94 bpm   Heart Rate (Exercise) 120 bpm   Heart Rate (Exit) 102 bpm   Oxygen Saturation (Admit) 95 %   Oxygen Saturation (Exercise) 92 %   Oxygen Saturation (Exit) 92 %   Rating of Perceived Exertion (Exercise) 11   Perceived Dyspnea (Exercise) 1   Duration Progress to 45 minutes of aerobic exercise without signs/symptoms of physical distress   Intensity THRR unchanged     Progression   Progression Continue to progress workloads to maintain intensity without signs/symptoms of physical distress.     Resistance Training   Training Prescription Yes   Weight green bands   Reps 10-12  10 minutes of strength training     Interval Training   Interval Training No     Oxygen   Oxygen Continuous   Liters 2     Bike   Level 1   Minutes 17     Track   Laps 16   Minutes 17      Nutrition:  Target Goals: Understanding of nutrition guidelines, daily intake of sodium '1500mg'$ , cholesterol '200mg'$ , calories 30% from fat and 7% or less from saturated fats, daily to have 5 or more servings of fruits and vegetables.  Biometrics:     Pre Biometrics - 07/10/16 1619      Pre Biometrics   Grip Strength 36 kg       Nutrition Therapy Plan and Nutrition Goals:     Nutrition Therapy & Goals - 08/05/16 1231      Nutrition Therapy   Diet General, Healthful       Personal Nutrition Goals   Personal Goal #1 1-2 lb wt loss/week to a wt loss goal of 6-24 lb at graduation from [facility], educate and counsel regarding individualized specific  dietary modifications aiming towards targeted core components such as weight, hypertension, lipid management, diabetes, heart failure and other comorbidities.   Expected Outcomes Short Term Goal: Understand basic principles of dietary content, such as calories, fat, sodium, cholesterol and nutrients.;Long Term Goal: Adherence to prescribed nutrition plan.      Nutrition Discharge: Rate Your Plate Scores:     Nutrition Assessments - 08/05/16 1231      Rate Your Plate Scores   Pre Score 54      Psychosocial: Target Goals: Acknowledge presence or absence of depression, maximize coping skills, provide positive support system. Participant is able to verbalize types and ability to use techniques and skills needed for reducing stress and depression.  Initial Review & Psychosocial Screening:     Initial Psych Review & Screening - 07/04/16 1036      Initial Review   Current issues with History of Depression     Family Dynamics   Good Support System? Yes     Barriers   Psychosocial barriers to participate in program Psychosocial barriers identified (see note)     Screening Interventions   Interventions Encouraged to exercise      Quality of Life Scores:   PHQ-9: Recent Review Flowsheet Data    Depression screen [facility] 2/9 07/04/2016   Decreased Interest 0   Down, Depressed, Hopeless 0   PHQ - 2 Score 0      Psychosocial Evaluation and Intervention:     Psychosocial Evaluation - 07/04/16 1145      Psychosocial Evaluation & Interventions   Interventions Encouraged to exercise with the program and follow exercise prescription      Psychosocial Re-Evaluation:     Psychosocial Re-Evaluation    [place] Name 08/19/16 0650 09/16/16 0713            Psychosocial Re-Evaluation   Interventions Encouraged to attend Pulmonary Rehabilitation for the exercise Encouraged to attend Pulmonary Rehabilitation for the exercise      Comments see comments on ITP see comments on ITP         Education: Education Goals: Education classes will be provided on a weekly basis, covering required topics. Participant will state understanding/return demonstration of topics presented.  Learning Barriers/Preferences:     Learning Barriers/Preferences - 07/04/16 1030      Learning Barriers/Preferences   Learning Barriers None   Learning Preferences Computer/Internet;Group Instruction;Individual Instruction;Skilled Demonstration;Verbal Instruction;Written Material;Video      Education Topics: How Lungs Work and Diseases: - Discuss the anatomy of the lungs and diseases that can affect the lungs, such as COPD.   Exercise: -Discuss the importance of exercise, FITT principles of exercise, normal and abnormal responses to exercise, and how to exercise safely.   Environmental Irritants: -Discuss types of environmental irritants and how to limit exposure to environmental irritants.   Meds/Inhalers and oxygen: - Discuss respiratory medications, definition of an inhaler and oxygen, and the proper way to use an inhaler and oxygen.   Energy Saving Techniques: - Discuss methods to conserve energy and decrease shortness of breath when performing activities of daily living.    Bronchial Hygiene / Breathing Techniques: - Discuss breathing mechanics, pursed-lip breathing technique,  proper posture, effective ways to clear airways, and other functional breathing techniques   Cleaning Equipment: - Provides group verbal and written instruction about the health risks of elevated stress, cause of high stress, and healthy ways to reduce stress.   Nutrition I: Fats: - Discuss the types of cholesterol, what cholesterol does to the body,  and how cholesterol  levels can be controlled.   Nutrition II: Labels: -Discuss the different components of food labels and how to read food labels.   Respiratory Infections: - Discuss the signs and symptoms of respiratory infections, ways to prevent respiratory infections, and the importance of seeking medical treatment when having a respiratory infection.   Stress I: Signs and Symptoms: - Discuss the causes of stress, how stress may lead to anxiety and depression, and ways to limit stress.   Stress II: Relaxation: -Discuss relaxation techniques to limit stress.   Oxygen for Home/Travel: - Discuss how to prepare for travel when on oxygen and proper ways to transport and store oxygen to ensure safety.   Knowledge Questionnaire Score:   Core Components/Risk Factors/Patient Goals at Admission:     Personal Goals and Risk Factors at Admission - 07/04/16 1142      Core Components/Risk Factors/Patient Goals on Admission   Stress --  Patient feels he is not stressed however he verbalizes multiple times how hard it is to raise a teenage boy at his age.      Core Components/Risk Factors/Patient Goals Review:      Goals and Risk Factor Review    Row Name 08/19/16 0649 09/16/16 0713           Core Components/Risk Factors/Patient Goals Review   Personal Goals Review Weight Management/Obesity;Increase Strength and Stamina;Increase knowledge of respiratory medications and ability to use respiratory devices properly.;Develop more efficient breathing techniques such as purse lipped breathing and diaphragmatic breathing and practicing self-pacing with activity.;Stress Weight Management/Obesity;Increase Strength and Stamina;Increase knowledge of respiratory medications and ability to use respiratory devices properly.;Develop more efficient breathing techniques such as purse lipped breathing and diaphragmatic breathing and practicing self-pacing with activity.;Stress      Review see comments section on ITP  see comments section on ITP      Expected Outcomes see admission expected outcomes see admission expected outcomes         Core Components/Risk Factors/Patient Goals at Discharge (Final Review):      Goals and Risk Factor Review - 09/16/16 0713      Core Components/Risk Factors/Patient Goals Review   Personal Goals Review Weight Management/Obesity;Increase Strength and Stamina;Increase knowledge of respiratory medications and ability to use respiratory devices properly.;Develop more efficient breathing techniques such as purse lipped breathing and diaphragmatic breathing and practicing self-pacing with activity.;Stress   Review see comments section on ITP   Expected Outcomes see admission expected outcomes      ITP Comments:   Comments: ITP REVIEW Pt is making slow progress toward pulmonary rehab goals after completing 12 sessions. He continues to have intermittant days of severe hypertension although it is improving with the addition of antihypertensive medications. He is being followed closely by his PCP. When Dion[place]e is here he continues to work hard. 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 He does verbalize that his grandson is a "good kid", it is just a huge responsibility meeting the child's basic needs. He denies need for help at this time. Recommend continued exercise, life style modification, education, and utilization of breathing techniques to increase stamina and strength and decrease shortness of breath with exertion.

## 2016-09-18 ENCOUNTER — Encounter (HOSPITAL_COMMUNITY)
Admission: RE | Admit: 2016-09-18 | Discharge: 2016-09-18 | Disposition: A | Discharge status: Home / Self Care | Payer: Non-veteran care | Source: Ambulatory Visit | Attending: Pulmonary Disease | Admitting: Pulmonary Disease

## 2016-09-18 VITALS — Wt 235.2 lb

## 2016-09-18 DIAGNOSIS — J438 Other emphysema: Secondary | ICD-10-CM | POA: Diagnosis not present

## 2016-09-18 NOTE — Progress Notes (Signed)
Daily Session Note  Patient Details  Name: Mark Robertson MRN: [id number] Date of Birth: 19-Dec-1940 Referring Provider:   [name] Pulmonary Rehab Walk Test from 07/10/2016 in [place]  Referring Provider  [name] ([place])]      Encounter Date: 09/18/2016  Check In:     Session Check In - 09/18/16 1049      Check-In   Location [facility]   Staff Present Rosebud Poles, RN, BSN;Lisa Ysidro Evert, RN;Zackerie Sara, MS, ACSM RCEP, Exercise Physiologist;Portia Rollene Rotunda, RN, BSN   Supervising physician immediately available to respond to emergencies [facility] immediately available   Physician(s) Dr. Allyson Sabal   Medication changes reported     No   Fall or balance concerns reported    No   Warm-up and Cool-down Performed as group-led instruction   Resistance Training Performed Yes   VAD Patient? No     Pain Assessment   Currently in Pain? No/denies   Multiple Pain Sites No      Capillary Blood Glucose: No results found for this or any previous visit (from the past 24 hour(s)).      Exercise Prescription Changes - 09/18/16 1200      Response to Exercise   Blood Pressure (Admit) 150/70   Blood Pressure (Exercise) 186/84   Blood Pressure (Exit) 152/80   Heart Rate (Admit) 92 bpm   Heart Rate (Exercise) 115 bpm   Heart Rate (Exit) 95 bpm   Oxygen Saturation (Admit) 94 %   Oxygen Saturation (Exercise) 94 %   Oxygen Saturation (Exit) 94 %   Rating of Perceived Exertion (Exercise) 11   Perceived Dyspnea (Exercise) 2   Duration Progress to 45 minutes of aerobic exercise without signs/symptoms of physical distress   Intensity THRR unchanged     Progression   Progression Continue to progress workloads to maintain intensity without signs/symptoms of physical distress.     Resistance Training   Training Prescription Yes   Weight green bands   Reps 10-12  10 minutes of strength training     Interval Training   Interval Training No     Oxygen   Oxygen Continuous   Liters 2     Bike   Level 1   Minutes 17     Track   Laps 12   Minutes 17     Goals Met:  Exercise tolerated well No report of cardiac concerns or symptoms Strength training completed today  Goals Unmet:  Not Applicable  Comments: Service time is from 10:30am to 12:15pm     Dr. Rush Farmer is Medical Director for Pulmonary Rehab at [facility].

## 2016-09-23 ENCOUNTER — Encounter (HOSPITAL_COMMUNITY)
Admission: RE | Admit: 2016-09-23 | Discharge: 2016-09-23 | Disposition: A | Discharge status: Home / Self Care | Payer: Non-veteran care | Source: Ambulatory Visit | Attending: Pulmonary Disease | Admitting: Pulmonary Disease

## 2016-09-23 VITALS — Wt 233.2 lb

## 2016-09-23 DIAGNOSIS — J438 Other emphysema: Secondary | ICD-10-CM | POA: Diagnosis not present

## 2016-09-23 NOTE — Progress Notes (Signed)
Daily Session Note  Patient Details  Name: Mark Robertson MRN: [id number] Date of Birth: [birth year] Referring Provider:   [name] Pulmonary Rehab Walk Test from 07/10/2016 in [place]  Referring Provider  [name] ([place])]      Encounter Date: 09/23/2016  Check In:     Session Check In - 09/23/16 1028      Check-In   Location [facility]   Staff Present Rosebud Poles, RN, BSN;Molly diVincenzo, MS, ACSM RCEP, Exercise Physiologist;Lisa Ysidro Evert, RN;Portia Rollene Rotunda, RN, BSN   Supervising physician immediately available to respond to emergencies [facility] immediately available   Physician(s) Dr. Nada Maclachlan   Medication changes reported     No   Fall or balance concerns reported    No   Warm-up and Cool-down Performed as group-led instruction   Resistance Training Performed Yes   VAD Patient? No     Pain Assessment   Currently in Pain? No/denies   Multiple Pain Sites No      Capillary Blood Glucose: No results found for this or any previous visit (from the past 24 hour(s)).      Exercise Prescription Changes - 09/23/16 1200      Response to Exercise   Blood Pressure (Admit) 164/70   Blood Pressure (Exercise) 154/70   Blood Pressure (Exit) 148/72   Heart Rate (Admit) 97 bpm   Heart Rate (Exercise) 123 bpm   Heart Rate (Exit) 106 bpm   Oxygen Saturation (Admit) 91 %   Oxygen Saturation (Exercise) 92 %   Oxygen Saturation (Exit) 92 %   Rating of Perceived Exertion (Exercise) 11   Perceived Dyspnea (Exercise) 2   Duration Progress to 45 minutes of aerobic exercise without signs/symptoms of physical distress   Intensity THRR unchanged     Progression   Progression Continue to progress workloads to maintain intensity without signs/symptoms of physical distress.     Resistance Training   Training Prescription Yes   Weight green bands   Reps 10-12  10 minutes of strength training     Interval Training   Interval Training No     Oxygen   Oxygen Continuous   Liters 2     Bike   Level 1   Minutes 17     NuStep   Level 6   Minutes 17   METs 3.2     Track   Minutes 17     Goals Met:  Exercise tolerated well Strength training completed today  Goals Unmet:  Not Applicable  Comments: Service time is from 1030 to 1200    Dr. Rush Farmer is Medical Director for Pulmonary Rehab at [facility].

## 2016-09-25 ENCOUNTER — Encounter (HOSPITAL_COMMUNITY)
Admission: RE | Admit: 2016-09-25 | Discharge: 2016-09-25 | Disposition: A | Discharge status: Home / Self Care | Payer: Non-veteran care | Source: Ambulatory Visit | Attending: Pulmonary Disease | Admitting: Pulmonary Disease

## 2016-09-25 VITALS — Wt 234.3 lb

## 2016-09-25 DIAGNOSIS — J438 Other emphysema: Secondary | ICD-10-CM | POA: Diagnosis not present

## 2016-09-25 NOTE — Progress Notes (Signed)
Daily Session Note  Patient Details  Name: Mark Robertson MRN: [id number] Date of Birth: 07-15-1941 Referring Provider:   [name] Pulmonary Rehab Walk Test from 07/10/2016 in [place]  Referring Provider  [name] ([place])]      Encounter Date: 09/25/2016  Check In:     Session Check In - 09/25/16 1050      Check-In   Location [facility]   Staff Present Su Hilt, MS, ACSM RCEP, Exercise Physiologist;Portia Rollene Rotunda, RN, Maxcine Ham, RN, BSN   Supervising physician immediately available to respond to emergencies [facility] immediately available   Physician(s) DR. Karleen Hampshire   Medication changes reported     No   Fall or balance concerns reported    No   Warm-up and Cool-down Performed as group-led instruction   Resistance Training Performed Yes   VAD Patient? No     Pain Assessment   Currently in Pain? No/denies   Multiple Pain Sites No      Capillary Blood Glucose: No results found for this or any previous visit (from the past 24 hour(s)).      Exercise Prescription Changes - 09/25/16 1200      Response to Exercise   Blood Pressure (Admit) 168/80   Blood Pressure (Exercise) 168/70   Blood Pressure (Exit) 142/70   Heart Rate (Admit) 93 bpm   Heart Rate (Exercise) 130 bpm   Heart Rate (Exit) 104 bpm   Oxygen Saturation (Admit) 94 %   Oxygen Saturation (Exercise) 90 %   Oxygen Saturation (Exit) 95 %   Rating of Perceived Exertion (Exercise) 11   Perceived Dyspnea (Exercise) 3   Duration Progress to 45 minutes of aerobic exercise without signs/symptoms of physical distress   Intensity THRR unchanged     Progression   Progression Continue to progress workloads to maintain intensity without signs/symptoms of physical distress.     Resistance Training   Training Prescription Yes   Weight green bands   Reps 10-12  10 minutes of strength training     Interval Training   Interval  Training No     Oxygen   Oxygen --  Exercised on room air today tolerated well     NuStep   Level 6   Minutes 17   METs 3.5     Track   Laps 16   Minutes 17     Goals Met:  Exercise tolerated well No report of cardiac concerns or symptoms Strength training completed today  Goals Unmet:  Not Applicable  Comments: Service time is from 1030 to 1230    Dr. Rush Farmer is Medical Director for Pulmonary Rehab at [facility].

## 2016-09-30 ENCOUNTER — Encounter (HOSPITAL_COMMUNITY)
Admission: RE | Admit: 2016-09-30 | Discharge: 2016-09-30 | Disposition: A | Discharge status: Home / Self Care | Payer: Non-veteran care | Source: Ambulatory Visit | Attending: Pulmonary Disease | Admitting: Pulmonary Disease

## 2016-09-30 DIAGNOSIS — J438 Other emphysema: Secondary | ICD-10-CM

## 2016-09-30 NOTE — Progress Notes (Signed)
Daily Session Note  Patient Details  Name: Trust T Skeen MRN: [id number] Date of Birth: 07/11/1941 Referring Provider:   Flowsheet Row Pulmonary Rehab Walk Test from 07/10/2016 in [facility]  Referring Provider  Dr.Yacoub [[place] ([place]      Encounter Date: 09/30/2016  Check In:     Session Check In - 09/30/16 1201      Check-In   Location [facility]   Staff Present Molly diVincenzo, MS, ACSM RCEP, Exercise Physiologist;Maria Whitaker, RN, BSN;Portia Payne, RN, BSN;Annedrea Stackhouse, RN, MHA   Supervising physician immediately available to respond to emergencies [facility] immediately available   Physician(s) Dr. Tat   Medication changes reported     No   Fall or balance concerns reported    No   Warm-up and Cool-down Performed as group-led instruction   Resistance Training Performed Yes   VAD Patient? No     Pain Assessment   Currently in Pain? No/denies   Multiple Pain Sites No      Capillary Blood Glucose: No results found for this or any previous visit (from the past 24 hour(s)).      Exercise Prescription Changes - 09/30/16 1200      Response to Exercise   Blood Pressure (Admit) 146/72   Blood Pressure (Exercise) 182/78   Blood Pressure (Exit) 152/82   Heart Rate (Admit) 92 bpm   Heart Rate (Exercise) 145 bpm   Heart Rate (Exit) 101 bpm   Oxygen Saturation (Admit) 94 %   Oxygen Saturation (Exercise) 90 %   Oxygen Saturation (Exit) 93 %   Rating of Perceived Exertion (Exercise) 12   Perceived Dyspnea (Exercise) 1   Duration Progress to 45 minutes of aerobic exercise without signs/symptoms of physical distress   Intensity THRR unchanged     Progression   Progression Continue to progress workloads to maintain intensity without signs/symptoms of physical distress.     Resistance Training   Training Prescription Yes   Weight green bands   Reps 10-12  10 minutes of strength training     Interval Training   Interval Training No     Oxygen   Oxygen --  Exercised on room air today tolerated well     Bike   Level 1   Minutes 17     NuStep   Level 6   Minutes 17   METs 3.1     Track   Laps 19   Minutes 17     Goals Met:  Exercise tolerated well No report of cardiac concerns or symptoms Strength training completed today  Goals Unmet:  Not Applicable  Comments: Service time is from 10:30am to 12:00pm    Dr. Wesam G. Yacoub is Medical Director for Pulmonary Rehab at [facility]. 

## 2016-10-02 ENCOUNTER — Encounter (HOSPITAL_COMMUNITY)
Admission: RE | Admit: 2016-10-02 | Discharge: 2016-10-02 | Disposition: A | Discharge status: Home / Self Care | Payer: Non-veteran care | Source: Ambulatory Visit | Attending: Pulmonary Disease | Admitting: Pulmonary Disease

## 2016-10-02 VITALS — Wt 235.5 lb

## 2016-10-02 DIAGNOSIS — J438 Other emphysema: Secondary | ICD-10-CM

## 2016-10-02 NOTE — Progress Notes (Signed)
Daily Session Note  Patient Details  Name: Mark Robertson MRN: [id number] Date of Birth: 01-27-1941 Referring Provider:   [name] Pulmonary Rehab Walk Test from 07/10/2016 in [place]  Referring Provider  [name] ([place])]      Encounter Date: 10/02/2016  Check In:     Session Check In - 10/02/16 1022      Check-In   Location [facility]   Staff Present Rosebud Poles, RN, Luisa Hart, RN, BSN;Jeralyn Nolden, MS, ACSM RCEP, Exercise Physiologist   Supervising physician immediately available to respond to emergencies [facility] immediately available   Physician(s) Dr. Ree Kida   Medication changes reported     No   Fall or balance concerns reported    No   Warm-up and Cool-down Performed as group-led instruction   Resistance Training Performed Yes   VAD Patient? No     Pain Assessment   Currently in Pain? No/denies   Multiple Pain Sites No      Capillary Blood Glucose: No results found for this or any previous visit (from the past 24 hour(s)).      Exercise Prescription Changes - 10/02/16 1200      Response to Exercise   Blood Pressure (Admit) 166/84   Blood Pressure (Exercise) 180/80   Blood Pressure (Exit) 164/82   Heart Rate (Admit) 102 bpm   Heart Rate (Exercise) 120 bpm   Heart Rate (Exit) 105 bpm   Oxygen Saturation (Admit) 95 %   Oxygen Saturation (Exercise) 93 %   Oxygen Saturation (Exit) 90 %   Rating of Perceived Exertion (Exercise) 13   Perceived Dyspnea (Exercise) 1   Duration Progress to 45 minutes of aerobic exercise without signs/symptoms of physical distress   Intensity THRR unchanged     Progression   Progression Continue to progress workloads to maintain intensity without signs/symptoms of physical distress.     Resistance Training   Training Prescription Yes   Weight green bands   Reps 10-12  10 minutes of strength training     Interval Training   Interval  Training No     Oxygen   Oxygen --  Exercised on room air today tolerated well     Bike   Level 1   Minutes 17     NuStep   Level 6   Minutes 17   METs 3.4     Goals Met:  Exercise tolerated well No report of cardiac concerns or symptoms Strength training completed today  Goals Unmet:  Not Applicable  Comments: Service time is from 10:30am to 12:00pm    Dr. Rush Farmer is Medical Director for Pulmonary Rehab at [facility]

## 2016-10-07 ENCOUNTER — Encounter (HOSPITAL_COMMUNITY)
Admission: RE | Admit: 2016-10-07 | Discharge: 2016-10-07 | Disposition: A | Discharge status: Home / Self Care | Payer: Non-veteran care | Source: Ambulatory Visit | Attending: Pulmonary Disease | Admitting: Pulmonary Disease

## 2016-10-07 VITALS — Wt 237.7 lb

## 2016-10-07 DIAGNOSIS — J438 Other emphysema: Secondary | ICD-10-CM

## 2016-10-07 NOTE — Progress Notes (Signed)
Daily Session Note  Patient Details  Name: Mark Robertson MRN: [id number] Date of Birth: [birth year] Referring Provider:   [name] Pulmonary Rehab Walk Test from 07/10/2016 in [place]  Referring Provider  [name] ([place])]      Encounter Date: 10/07/2016  Check In:     Session Check In - 10/07/16 1029      Check-In   Location [facility]   Staff Present Rosebud Poles, RN, BSN;Ramon Dredge, RN, MHA;Portia Rollene Rotunda, RN, BSN   Supervising physician immediately available to respond to emergencies [facility] immediately available   Physician(s) Dr. Ree Kida   Medication changes reported     No   Fall or balance concerns reported    No   Warm-up and Cool-down Performed as group-led instruction   Resistance Training Performed Yes   VAD Patient? No     Pain Assessment   Currently in Pain? No/denies   Multiple Pain Sites No      Capillary Blood Glucose: No results found for this or any previous visit (from the past 24 hour(s)).      Exercise Prescription Changes - 10/07/16 1200      Response to Exercise   Blood Pressure (Admit) 156/70   Blood Pressure (Exercise) 172/90   Blood Pressure (Exit) 140/80   Heart Rate (Admit) 105 bpm   Heart Rate (Exercise) 129 bpm   Heart Rate (Exit) 105 bpm   Oxygen Saturation (Admit) 95 %   Oxygen Saturation (Exercise) 90 %   Oxygen Saturation (Exit) 93 %   Rating of Perceived Exertion (Exercise) 12   Perceived Dyspnea (Exercise) 2   Duration Progress to 45 minutes of aerobic exercise without signs/symptoms of physical distress   Intensity THRR unchanged     Progression   Progression Continue to progress workloads to maintain intensity without signs/symptoms of physical distress.     Resistance Training   Training Prescription Yes   Weight green bands   Reps 10-12  10 minutes of strength training     Interval Training   Interval Training No     Oxygen   Oxygen Continuous   Liters 2     Bike   Level 1   Minutes 17     NuStep   Level 6   Minutes 17   METs 2.4     Track   Laps 18   Minutes 17     Goals Met:  Exercise tolerated well Strength training completed today  Goals Unmet:  Not Applicable  Comments: Service time is from 1030 to 1200    Dr. Rush Farmer is Medical Director for Pulmonary Rehab at [facility].

## 2016-10-09 ENCOUNTER — Encounter (HOSPITAL_COMMUNITY)
Admission: RE | Admit: 2016-10-09 | Discharge: 2016-10-09 | Disposition: A | Discharge status: Home / Self Care | Payer: Non-veteran care | Source: Ambulatory Visit | Attending: Pulmonary Disease | Admitting: Pulmonary Disease

## 2016-10-09 DIAGNOSIS — J438 Other emphysema: Secondary | ICD-10-CM

## 2016-10-09 NOTE — Progress Notes (Signed)
Pulmonary Individual Treatment Plan  Patient Details  Name: Mark Robertson MRN: [id number] Date of Birth: 1941-09-25 Referring Provider:   [name] Pulmonary Rehab Walk Test from 07/10/2016 in [place]  Referring Provider  [name] ([place])]      Initial Encounter Date:  Flowsheet Row Pulmonary Rehab Walk Test from 07/10/2016 in [place]  Date  07/10/16  Referring Provider  Dr.Yacoub Gwenette Greet ([place])]      Visit Diagnosis: Other emphysema ([place])  Patient's Home Medications on Admission:   Current Outpatient Prescriptions:  .  amoxicillin (AMOXIL) 500 MG tablet, Take 2,000 mg by mouth once as needed (He is to take prior to dental procedures.)., Disp: , Rfl:  .  aspirin EC 81 MG tablet, Take 81 mg by mouth every evening., Disp: , Rfl:  .  BREO ELLIPTA 100-25 MCG/INH AEPB, Inhale 1 puff into the lungs daily. , Disp: , Rfl:  .  Cholecalciferol (VITAMIN D) 2000 UNITS tablet, Take 2,000 Units by mouth daily., Disp: , Rfl:  .  flunisolide (NASALIDE) 0.025 % SOLN, Inhale 1 spray into the lungs daily as needed (For allergies.). , Disp: , Rfl:  .  gabapentin (NEURONTIN) 300 MG capsule, Take 300 mg by mouth 2 (two) times daily., Disp: , Rfl:  .  losartan (COZAAR) 50 MG tablet, Take 50 mg by mouth daily with breakfast. , Disp: , Rfl:  .  Menthol, Topical Analgesic, (ICY HOT EX), Apply 1 application topically daily as needed (For pain.)., Disp: , Rfl:  .  Multiple Vitamins-Minerals (CENTRUM SILVER) tablet, Take 1 tablet by mouth daily with breakfast., Disp: , Rfl:  .  naproxen sodium (ANAPROX) 220 MG tablet, Take 220-440 mg by mouth 2 (two) times daily with a meal. He takes two tablets in the morning and one tablet in the evening., Disp: , Rfl:  .  Polyethyl Glycol-Propyl Glycol (SYSTANE) 0.4-0.3 % SOLN, Place 1-2 drops into both eyes daily as needed (For dry eyes.)., Disp: , Rfl:  .  ranitidine (ZANTAC) 150 MG tablet, Take  150 mg by mouth daily., Disp: , Rfl:  .  rosuvastatin (CRESTOR) 10 MG tablet, Take 10 mg by mouth every evening., Disp: , Rfl:  .  Sildenafil Citrate (VIAGRA PO), Take 1 tablet by mouth daily as needed (For erectile dysfunction.)., Disp: , Rfl:  .  traZODone (DESYREL) 50 MG tablet, Take 50 mg by mouth at bedtime., Disp: , Rfl:   Past Medical History: Past Medical History:  Diagnosis Date  . Cavitary pneumonia 09/29/2014  . COPD (chronic obstructive pulmonary disease) ([place])   . Depression   . Esophageal dysmotility 09/30/2014  . Esophageal thickening 09/29/2014  . Hiatal hernia 09/30/2014  . Hypercholesteremia   . Hypertension     Tobacco Use: History  Smoking Status  . Former Smoker  . Packs/day: 2.00  . Years: 50.00  . Types: Cigarettes  . Quit date: 10/13/2001  Smokeless Tobacco  . Not on file    Labs: Recent Review Flowsheet Data    Labs for ITP Cardiac and Pulmonary Rehab Latest Ref Rng & Units 09/29/2014   TCO2 0 - 100 mmol/L 33      Capillary Blood Glucose: No results found for: GLUCAP   ADL UCSD:   Pulmonary Function Assessment:     Pulmonary Function Assessment - 07/04/16 1030      Breath   Bilateral Breath Sounds Clear   Shortness of Breath No  resolved with Memory Dance  Exercise Target Goals:    Exercise Program Goal: Individual exercise prescription set with THRR, safety & activity barriers. Participant demonstrates ability to understand and report RPE using BORG scale, to self-measure pulse accurately, and to acknowledge the importance of the exercise prescription.  Exercise Prescription Goal: Starting with aerobic activity 30 plus minutes a day, 3 days per week for initial exercise prescription. Provide home exercise prescription and guidelines that participant acknowledges understanding prior to discharge.  Activity Barriers & Risk Stratification:     Activity Barriers & Cardiac Risk Stratification - 07/04/16 1030      Activity Barriers &  Cardiac Risk Stratification   Activity Barriers Left Knee Replacement;Right Knee Replacement;Deconditioning;Shortness of Breath;Neck/Spine Problems      6 Minute Walk:     6 Minute Walk    Row Name 07/10/16 1601 09/25/16 1417       6 Minute Walk   Phase Initial Mid Program    Distance 800 feet 1200 feet    Walk Time 6 minutes 6 minutes    # of Rest Breaks 0 0    MPH 1.5 2.27    METS 2.15 2.68    RPE 11 11    Perceived Dyspnea  0 1    Symptoms No No    Resting HR 81 bpm 97 bpm    Resting BP 140/82 164/70    Max Ex. HR 98 bpm 122 bpm    Max Ex. BP 164/80 190/90    2 Minute Post BP 140/84 150/90      Interval HR   Baseline HR 81 97    1 Minute HR 86 102    2 Minute HR 97 113    3 Minute HR 98 114    4 Minute HR 93 114    5 Minute HR 94 118    6 Minute HR 112 122    2 Minute Post HR 90 118    Interval Heart Rate? Yes Yes      Interval Oxygen   Interval Oxygen? Yes Yes    Baseline Oxygen Saturation % 92 % 91 %    Baseline Liters of Oxygen 0 L 0 L    1 Minute Oxygen Saturation % 85 % 85 %    1 Minute Liters of Oxygen 0 L 0 L    2 Minute Oxygen Saturation % 94 % 91 %    2 Minute Liters of Oxygen 2 L 0 L    3 Minute Oxygen Saturation % 90 % 92 %    3 Minute Liters of Oxygen 2 L 0 L    4 Minute Oxygen Saturation % 93 % 90 %    4 Minute Liters of Oxygen 2 L 0 L    5 Minute Oxygen Saturation % 92 % 90 %    5 Minute Liters of Oxygen 2 L 0 L    6 Minute Oxygen Saturation % 96 % 90 %    6 Minute Liters of Oxygen 2 L 0 L    2 Minute Post Oxygen Saturation % 98 % 94 %    2 Minute Post Liters of Oxygen 2 L  -       Initial Exercise Prescription:     Initial Exercise Prescription - 07/10/16 1600      Date of Initial Exercise RX and Referring Provider   Date 07/10/16   Referring Provider Dr.Yacoub  [place]ance ([place])     Oxygen   Oxygen Continuous   Liters 2  Bike   Level 0.5   Minutes 17     NuStep   Level 2   Minutes 17   METs 1.5     Track   Laps 5    Minutes 17     Prescription Details   Frequency (times per week) 2   Duration Progress to 45 minutes of aerobic exercise without signs/symptoms of physical distress     Intensity   THRR 40-80% of Max Heartrate 58-116   Ratings of Perceived Exertion 11-13   Perceived Dyspnea 0-4     Progression   Progression Continue progressive overload as per policy without signs/symptoms or physical distress.     Resistance Training   Training Prescription Yes   Weight GREEN BAND   Reps 10-12      Perform Capillary Blood Glucose checks as needed.  Exercise Prescription Changes:     Exercise Prescription Changes    Row Name 07/31/16 1200 08/05/16 1200 08/07/16 1313 08/14/16 1300 08/19/16 1200     Exercise Review   Progression  -  -  -  - Yes     Response to Exercise   Blood Pressure (Admit) 168/84 150/76 150/76 162/80 150/60   Blood Pressure (Exercise) 154/80 154/76 200/84  reck 190/93 186/80  left 181/91 right 190/93 180/90 manual 180/80   Blood Pressure (Exit) 142/66 144/72 152/86 170/88 142/62   Heart Rate (Admit) 88 bpm 80 bpm 81 bpm 89 bpm 85 bpm   Heart Rate (Exercise) 106 bpm 103 bpm 116 bpm 112 bpm 130 bpm   Heart Rate (Exit) 85 bpm 88 bpm 88 bpm 91 bpm 105 bpm   Oxygen Saturation (Admit) 96 % 98 % 94 % 89 % 94 %   Oxygen Saturation (Exercise) 93 % 95 % 95 % 112 % 94 %   Oxygen Saturation (Exit) 97 % 93 % 94 % 91 % 95 %   Rating of Perceived Exertion (Exercise) _0 Perceived Dyspnea (Exercise) 1 0 _1 Duration Progress to 45 minutes of aerobic exercise without signs/symptoms of physical distress Progress to 45 minutes of aerobic exercise without signs/symptoms of physical distress Progress to 45 minutes of aerobic exercise without signs/symptoms of physical distress Progress to 45 minutes of aerobic exercise without signs/symptoms of physical distress Progress to 45 minutes of aerobic exercise without signs/symptoms of physical distress   Intensity -  40-80% of  HRR THRR unchanged THRR unchanged THRR unchanged THRR unchanged     Resistance Training   Training Prescription _2    Weight _3 s   Reps 10-12  10 minutes of strength training 10-12  10 minutes of strength training 10-12  10 minutes of strength training 10-12  10 minutes of strength training 10-12     Interval Training   Interval Training _4      Oxygen   Oxygen _5    Liters  - _6 Bike   Level 0.5 1  -  - 1   Minutes 17 17  -  - 17     NuStep   Level 2 3  - 3 4   Minutes 17 17  - 17 17   METs 1.9 2.5  - 1.9 2.8     Track   Laps  - -  met with nutritionist during this station 13  11 12   Minutes  - _0 Row Name 08/21/16 1300 08/26/16 1200 08/28/16 1200 09/02/16 1100 09/09/16 1200     Exercise Review   Progression  - Yes  -  -  -     Response to Exercise   Blood Pressure (Admit) 154/60 154/64 160/70 150/74 154/80   Blood Pressure (Exercise) 160/76 166/84 220/90 166/80 184/90   Blood Pressure (Exit) 138/72 142/62 136/74 146/76 138/64   Heart Rate (Admit) 80 bpm 80 bpm 98 bpm 106 bpm 99 bpm   Heart Rate (Exercise) 122 bpm 122 bpm 111 bpm 113 bpm 117 bpm   Heart Rate (Exit) 100 bpm 100 bpm 93 bpm 105 bpm 106 bpm   Oxygen Saturation (Admit) 95 % 92 % 91 % 97 % 92 %   Oxygen Saturation (Exercise) 89 % 92 % 96 % 94 % 95 %   Oxygen Saturation (Exit) 95 % 94 % 95 % 94 % 93 %   Rating of Perceived Exertion (Exercise) _1 Perceived Dyspnea (Exercise) _2 0 0   Duration Progress to 45 minutes of aerobic exercise without signs/symptoms of physical distress Progress to 45 minutes of aerobic exercise without signs/symptoms of physical distress Progress to 45 minutes of aerobic exercise without signs/symptoms of physical distress Progress to 45 minutes of aerobic exercise without signs/symptoms of physical distress Progress to  45 minutes of aerobic exercise without signs/symptoms of physical distress   Intensity _3      Resistance Training   Training Prescription _4    Weight _5    Reps 10-12 10-12  10 minutes of sttength training 10-12  10 minutes of sttength training 10-12  10 minutes of sttength training 10-12  10 minutes of strength training     Interval Training   Interval Training _6      Oxygen   Oxygen _7    Liters _8 Bike   Level  - _9 Minutes  - _10 NuStep   Level _11 Minutes _12 METs 3.5 3.3 2.5 1.9 2.7     Track   Laps 14 16  - 14 13   Minutes 17 17  - 17 17   Row Name 09/11/16 1200 09/16/16 1200 09/18/16 1200 09/23/16 1200 09/25/16 1200     Exercise Review   Progression  - Yes  -  -  -     Response to Exercise   Blood Pressure (Admit) 160/80 140/60 150/70 164/70 168/80   Blood Pressure (Exercise) 170/90 180/84 186/84 154/70 168/70   Blood Pressure (Exit) 144/70 132/70 152/80 148/72 142/70   Heart Rate (Admit) 94 bpm 92 bpm 92 bpm 97 bpm 93 bpm   Heart Rate (Exercise) 120 bpm 128 bpm 115 bpm 123 bpm 130 bpm   Heart Rate (Exit) 102 bpm 104 bpm 95 bpm 106 bpm 104 bpm   Oxygen Saturation (Admit) 95 % 96 % 94 % 91 % 94 %   Oxygen Saturation (Exercise) 92 % 93 % 94 % 92 % 90 %   Oxygen Saturation (Exit) 92 % 93 % 94 % 92 % 95 %  Rating of Perceived Exertion (Exercise) '11 11 11 11 11   '$ Perceived Dyspnea (Exercise) '1 2 2 2 3   '$ Duration Progress to 45 minutes of aerobic exercise without signs/symptoms of physical distress Progress to 45 minutes of aerobic exercise without signs/symptoms of physical distress Progress to 45 minutes of aerobic exercise without signs/symptoms of physical distress Progress to 45 minutes of aerobic exercise  without signs/symptoms of physical distress Progress to 45 minutes of aerobic exercise without signs/symptoms of physical distress   Intensity THRR unchanged THRR unchanged THRR unchanged THRR unchanged THRR unchanged     Progression   Progression Continue to progress workloads to maintain intensity without signs/symptoms of physical distress. Continue to progress workloads to maintain intensity without signs/symptoms of physical distress. Continue to progress workloads to maintain intensity without signs/symptoms of physical distress. Continue to progress workloads to maintain intensity without signs/symptoms of physical distress. Continue to progress workloads to maintain intensity without signs/symptoms of physical distress.     Resistance Training   Training Prescription Yes Yes Yes Yes Yes   Weight green bands green bands green bands green bands green bands   Reps 10-12  10 minutes of strength training 10-12  10 minutes of strength training 10-12  10 minutes of strength training 10-12  10 minutes of strength training 10-12  10 minutes of strength training     Interval Training   Interval Training No No No No No     Oxygen   Oxygen Continuous Continuous Continuous Continuous -  Exercised on room air today tolerated well   Liters '2 2 2 2  '$ -     Bike   Level '1 1 1 1  '$ -   Minutes '17 17 17 17  '$ -     NuStep   Level  - 6  - 6 6   Minutes  - 17  - 17 17   METs  - 3.4  - 3.2 3.5     Track   Laps '16 16 12  '$ - 16   Minutes '17 17 17 17 17   '$ Row Name 09/30/16 1200 10/02/16 1200 10/07/16 1200         Response to Exercise   Blood Pressure (Admit) 146/72 166/84 156/70     Blood Pressure (Exercise) 182/78 180/80 172/90     Blood Pressure (Exit) 152/82 164/82 140/80     Heart Rate (Admit) 92 bpm 102 bpm 105 bpm     Heart Rate (Exercise) 145 bpm 120 bpm 129 bpm     Heart Rate (Exit) 101 bpm 105 bpm 105 bpm     Oxygen Saturation (Admit) 94 % 95 % 95 %     Oxygen Saturation (Exercise)  90 % 93 % 90 %     Oxygen Saturation (Exit) 93 % 90 % 93 %     Rating of Perceived Exertion (Exercise) '12 13 12     '$ Perceived Dyspnea (Exercise) '1 1 2     '$ Duration Progress to 45 minutes of aerobic exercise without signs/symptoms of physical distress Progress to 45 minutes of aerobic exercise without signs/symptoms of physical distress Progress to 45 minutes of aerobic exercise without signs/symptoms of physical distress     Intensity THRR unchanged THRR unchanged THRR unchanged       Progression   Progression Continue to progress workloads to maintain intensity without signs/symptoms of physical distress. Continue to progress workloads to maintain intensity without signs/symptoms of physical distress. Continue to progress workloads to maintain intensity without  signs/symptoms of physical distress.       Resistance Training   Training Prescription Yes Yes Yes     Weight green bands green bands green bands     Reps 10-12  10 minutes of strength training 10-12  10 minutes of strength training 10-12  10 minutes of strength training       Interval Training   Interval Training No No No       Oxygen   Oxygen -  Exercised on room air today tolerated well -  Exercised on room air today tolerated well Continuous     Liters  -  - 2       Bike   Level '1 1 1     '$ Minutes '17 17 17       '$ NuStep   Level '6 6 6     '$ Minutes '17 17 17     '$ METs 3.1 3.4 2.4       Track   Laps 19  - 18     Minutes 17  - 17        Exercise Comments:     Exercise Comments    Row Name 08/18/16 1627 09/15/16 0956 09/30/16 0848       Exercise Comments Patient's blood pressure have been out of control. Has only attended four entire sessions. Blood pressure has been addressed by the doctor and medicines have been adjusted. Will cont. to monitor.  Patient's blood pressure is still out of control--Dr. has readjusted his medicine again. Will continue to monitor. Patient is improving slowly. Will discuss home exercise  in the next week or two depending on BP, Patient's blood pressure is still running high. Discussed home exercise with patient. Patient went out and bought a pulse oximeter.         Discharge Exercise Prescription (Final Exercise Prescription Changes):     Exercise Prescription Changes - 10/07/16 1200      Response to Exercise   Blood Pressure (Admit) 156/70   Blood Pressure (Exercise) 172/90   Blood Pressure (Exit) 140/80   Heart Rate (Admit) 105 bpm   Heart Rate (Exercise) 129 bpm   Heart Rate (Exit) 105 bpm   Oxygen Saturation (Admit) 95 %   Oxygen Saturation (Exercise) 90 %   Oxygen Saturation (Exit) 93 %   Rating of Perceived Exertion (Exercise) 12   Perceived Dyspnea (Exercise) 2   Duration Progress to 45 minutes of aerobic exercise without signs/symptoms of physical distress   Intensity THRR unchanged     Progression   Progression Continue to progress workloads to maintain intensity without signs/symptoms of physical distress.     Resistance Training   Training Prescription Yes   Weight green bands   Reps 10-12  10 minutes of strength training     Interval Training   Interval Training No     Oxygen   Oxygen Continuous   Liters 2     Bike   Level 1   Minutes 17     NuStep   Level 6   Minutes 17   METs 2.4     Track   Laps 18   Minutes 17       Nutrition:  Target Goals: Understanding of nutrition guidelines, daily intake of sodium '1500mg'$ , cholesterol '200mg'$ , calories 30% from fat and 7% or less from saturated fats, daily to have 5 or more servings of fruits and vegetables.  Biometrics:     Pre Biometrics - 07/10/16 1619  Pre Biometrics   Grip Strength 36 kg       Nutrition Therapy Plan and Nutrition Goals:     Nutrition Therapy & Goals - 08/05/16 1231      Nutrition Therapy   Diet General, Healthful      Personal Nutrition Goals   Personal Goal #1 1-2 lb wt loss/week to a wt loss goal of 6-24 lb at graduation from [place], educate and counsel regarding individualized specific dietary modifications aiming towards targeted core components such as weight, hypertension, lipid management, diabetes, heart failure and other comorbidities.   Expected Outcomes Short Term Goal: Understand basic principles of dietary content, such as calories, fat, sodium, cholesterol and nutrients.;Long Term Goal: Adherence to prescribed nutrition plan.      Nutrition Discharge: Rate Your Plate Scores:     Nutrition Assessments - 08/05/16 1231      Rate Your Plate Scores   Pre Score 54      Psychosocial: Target Goals: Acknowledge presence or absence of depression, maximize coping skills, provide positive support system. Participant is able to verbalize types and ability to use techniques and skills needed for reducing stress and depression.  Initial Review & Psychosocial Screening:     Initial Psych Review & Screening - 07/04/16 1036      Initial Review   Current issues with History of Depression     Family Dynamics   Good Support System? Yes     Barriers   Psychosocial barriers to participate in program Psychosocial barriers identified (see note)     Screening Interventions   Interventions Encouraged to exercise      Quality of Life Scores:   PHQ-9: Recent Review Flowsheet Data    Depression screen [facility] 2/9 07/04/2016   Decreased Interest 0   Down, Depressed, Hopeless 0   PHQ - 2 Score 0      Psychosocial Evaluation and Intervention:     Psychosocial Evaluation - 07/04/16 1145      Psychosocial Evaluation & Interventions   Interventions Encouraged to exercise with the program and follow exercise prescription      Psychosocial Re-Evaluation:     Psychosocial Re-Evaluation    [place] Name 08/19/16 0650 09/16/16 0713 10/07/16 5102         Psychosocial Re-Evaluation   Interventions Encouraged to attend Pulmonary Rehabilitation for the exercise  Encouraged to attend Pulmonary Rehabilitation for the exercise Encouraged to attend Pulmonary Rehabilitation for the exercise     Comments see comments on ITP see comments on ITP no psychosocial barriers identified during the past 30 days       Education: Education Goals: Education classes will be provided on a weekly basis, covering required topics. Participant will state understanding/return demonstration of topics presented.  Learning Barriers/Preferences:     Learning Barriers/Preferences - 07/04/16 1030      Learning Barriers/Preferences   Learning Barriers None   Learning Preferences Computer/Internet;Group Instruction;Individual Instruction;Skilled Demonstration;Verbal Instruction;Written Material;Video      Education Topics: Risk Factor Reduction:  -Group instruction that is supported by a PowerPoint presentation. Instructor discusses the definition of a risk factor, different risk factors for pulmonary disease, and how the heart and lungs work together.     Nutrition for Pulmonary Patient:  -Group instruction provided by PowerPoint slides, verbal discussion, and written materials to support subject matter. The instructor gives an explanation and review of healthy diet recommendations, which includes a discussion on weight management, recommendations  for fruit and vegetable consumption, as well as protein, fluid, caffeine, fiber, sodium, sugar, and alcohol. Tips for eating when patients are short of breath are discussed. Flowsheet Row PULMONARY REHAB OTHER RESPIRATORY from 10/02/2016 in [place]  Date  08/21/16 [facility] Eating During the [place]  Educator  RD  Instruction Review Code  2- meets goals/outcomes      Pursed Lip Breathing:  -Group instruction that is supported by demonstration and informational handouts. Instructor discusses the benefits of pursed lip and diaphragmatic breathing and detailed demonstration on how to preform both.    Flowsheet Row PULMONARY REHAB OTHER RESPIRATORY from 10/02/2016 in [place]  Date  10/02/16  Educator  ep  Instruction Review Code  2- meets goals/outcomes      Oxygen Safety:  -Group instruction provided by PowerPoint, verbal discussion, and written material to support subject matter. There is an overview of "What is Oxygen" and "Why do we need it".  Instructor also reviews how to create a safe environment for oxygen use, the importance of using oxygen as prescribed, and the risks of noncompliance. There is a brief discussion on traveling with oxygen and resources the patient may utilize. Flowsheet Row PULMONARY REHAB OTHER RESPIRATORY from 10/02/2016 in [place]  Date  08/14/16  Educator  RN  Instruction Review Code  2- meets goals/outcomes      Oxygen Equipment:  -Group instruction provided by [facility] Staff utilizing handouts, written materials, and equipment demonstrations. Flowsheet Row PULMONARY REHAB OTHER RESPIRATORY from 10/02/2016 in [place]  Date  08/28/16  Educator  Rep  Instruction Review Code  2- meets goals/outcomes      Signs and Symptoms:  -Group instruction provided by written material and verbal discussion to support subject matter. Warning signs and symptoms of infection, stroke, and heart attack are reviewed and when to call the physician/911 reinforced. Tips for preventing the spread of infection discussed. Flowsheet Row PULMONARY REHAB OTHER RESPIRATORY from 10/02/2016 in [place]  Date  09/18/16  Educator  RN  Instruction Review Code  2- meets goals/outcomes      Advanced Directives:  -Group instruction provided by verbal instruction and written material to support subject matter. Instructor reviews Advanced Directive laws and proper instruction for filling out document.   Pulmonary Video:  -Group video education  that reviews the importance of medication and oxygen compliance, exercise, good nutrition, pulmonary hygiene, and pursed lip and diaphragmatic breathing for the pulmonary patient. Flowsheet Row PULMONARY REHAB OTHER RESPIRATORY from 10/02/2016 in [place]  Date  09/11/16  Educator  video  Instruction Review Code  2- meets goals/outcomes      Exercise for the Pulmonary Patient:  -Group instruction that is supported by a PowerPoint presentation. Instructor discusses benefits of exercise, core components of exercise, frequency, duration, and intensity of an exercise routine, importance of utilizing pulse oximetry during exercise, safety while exercising, and options of places to exercise outside of rehab.     Pulmonary Medications:  -Verbally interactive group education provided by instructor with focus on inhaled medications and proper administration.   Anatomy and Physiology of the Respiratory System and Intimacy:  -Group instruction provided by PowerPoint, verbal discussion, and written material to support subject matter. Instructor reviews respiratory cycle and anatomical components of the respiratory system and their functions. Instructor also reviews differences in obstructive and restrictive respiratory diseases with  examples of each. Intimacy, Sex, and Sexuality differences are reviewed with a discussion on how relationships can change when diagnosed with pulmonary disease. Common sexual concerns are reviewed. Flowsheet Row PULMONARY REHAB OTHER RESPIRATORY from 10/02/2016 in [place]  Date  09/25/16  Educator  RN  Instruction Review Code  2- meets goals/outcomes      Knowledge Questionnaire Score:   Core Components/Risk Factors/Patient Goals at Admission:     Personal Goals and Risk Factors at Admission - 07/04/16 1142      Core Components/Risk Factors/Patient Goals on Admission   Stress --  Patient feels he  is not stressed however he verbalizes multiple times how hard it is to raise a teenage boy at his age.      Core Components/Risk Factors/Patient Goals Review:      Goals and Risk Factor Review    Row Name 08/19/16 0649 09/16/16 0713 10/07/16 0821         Core Components/Risk Factors/Patient Goals Review   Personal Goals Review Weight Management/Obesity;Increase Strength and Stamina;Increase knowledge of respiratory medications and ability to use respiratory devices properly.;Develop more efficient breathing techniques such as purse lipped breathing and diaphragmatic breathing and practicing self-pacing with activity.;Stress Weight Management/Obesity;Increase Strength and Stamina;Increase knowledge of respiratory medications and ability to use respiratory devices properly.;Develop more efficient breathing techniques such as purse lipped breathing and diaphragmatic breathing and practicing self-pacing with activity.;Stress Weight Management/Obesity;Increase Strength and Stamina;Increase knowledge of respiratory medications and ability to use respiratory devices properly.;Develop more efficient breathing techniques such as purse lipped breathing and diaphragmatic breathing and practicing self-pacing with activity.;Stress     Review see comments section on ITP see comments section on ITP see comments section on ITP     Expected Outcomes see admission expected outcomes see admission expected outcomes see admission expected outcomes        Core Components/Risk Factors/Patient Goals at Discharge (Final Review):      Goals and Risk Factor Review - 10/07/16 0821      Core Components/Risk Factors/Patient Goals Review   Personal Goals Review Weight Management/Obesity;Increase Strength and Stamina;Increase knowledge of respiratory medications and ability to use respiratory devices properly.;Develop more efficient breathing techniques such as purse lipped breathing and diaphragmatic breathing and  practicing self-pacing with activity.;Stress   Review see comments section on ITP   Expected Outcomes see admission expected outcomes      ITP Comments:   Comments: ITP REVIEW Pt is making expected progress toward most of his  pulmonary rehab goals after completing 20 sessions. He continues to struggle with weight loss especially during the holidays. However he is doing well with reducing his alcohol consumption. He is also watching his sodium intake in order to reduce his BP. He is compliant with his medications. He states he does feel an increase in his stamina and strength. Recommend continued exercise, life style modification, education, and utilization of breathing techniques to increase stamina and strength and decrease shortness of breath with exertion.

## 2016-10-14 ENCOUNTER — Encounter (HOSPITAL_COMMUNITY)
Admission: RE | Admit: 2016-10-14 | Discharge: 2016-10-14 | Disposition: A | Discharge status: Home / Self Care | Payer: Non-veteran care | Source: Ambulatory Visit | Attending: Pulmonary Disease | Admitting: Pulmonary Disease

## 2016-10-14 VITALS — Wt 236.8 lb

## 2016-10-14 DIAGNOSIS — F329 Major depressive disorder, single episode, unspecified: Secondary | ICD-10-CM | POA: Insufficient documentation

## 2016-10-14 DIAGNOSIS — J438 Other emphysema: Secondary | ICD-10-CM | POA: Insufficient documentation

## 2016-10-14 DIAGNOSIS — I10 Essential (primary) hypertension: Secondary | ICD-10-CM | POA: Diagnosis not present

## 2016-10-14 DIAGNOSIS — K224 Dyskinesia of esophagus: Secondary | ICD-10-CM | POA: Insufficient documentation

## 2016-10-14 DIAGNOSIS — K449 Diaphragmatic hernia without obstruction or gangrene: Secondary | ICD-10-CM | POA: Insufficient documentation

## 2016-10-14 DIAGNOSIS — J449 Chronic obstructive pulmonary disease, unspecified: Secondary | ICD-10-CM | POA: Diagnosis not present

## 2016-10-14 DIAGNOSIS — E7800 Pure hypercholesterolemia, unspecified: Secondary | ICD-10-CM | POA: Insufficient documentation

## 2016-10-14 NOTE — Progress Notes (Signed)
Daily Session Note  Patient Details  Name: Mark Robertson MRN: [id number] Date of Birth: 02-26-1941 Referring Provider:   [name] Pulmonary Rehab Walk Test from 07/10/2016 in [place]  Referring Provider  [name] ([place])]      Encounter Date: 10/14/2016  Check In:     Session Check In - 10/14/16 1014      Check-In   Location [facility]   Staff Present Rosebud Poles, RN, BSN;Molly diVincenzo, MS, ACSM RCEP, Exercise Physiologist;Zada Haser Ysidro Evert, RN   Supervising physician immediately available to respond to emergencies [facility] immediately available   Physician(s) Dr. Quincy Simmonds   Medication changes reported     No   Fall or balance concerns reported    No   Warm-up and Cool-down Performed as group-led instruction   Resistance Training Performed Yes   VAD Patient? No     Pain Assessment   Currently in Pain? No/denies   Multiple Pain Sites No      Capillary Blood Glucose: No results found for this or any previous visit (from the past 24 hour(s)).      Exercise Prescription Changes - 10/14/16 1200      Exercise Review   Progression Yes     Response to Exercise   Blood Pressure (Admit) 154/72   Blood Pressure (Exercise) 170/80   Blood Pressure (Exit) 122/60   Heart Rate (Admit) 83 bpm   Heart Rate (Exercise) 129 bpm   Heart Rate (Exit) 106 bpm   Oxygen Saturation (Admit) 95 %   Oxygen Saturation (Exercise) 91 %   Oxygen Saturation (Exit) 93 %   Rating of Perceived Exertion (Exercise) 13   Perceived Dyspnea (Exercise) 2   Duration Progress to 45 minutes of aerobic exercise without signs/symptoms of physical distress   Intensity THRR unchanged     Progression   Progression Continue to progress workloads to maintain intensity without signs/symptoms of physical distress.     Resistance Training   Training Prescription Yes   Weight green bands   Reps 10-12  10 minutes of strength training     Interval Training   Interval Training No     Oxygen   Oxygen Continuous   Liters 2     Bike   Level 1   Minutes 17     NuStep   Level 7   Minutes 17   METs 4     Track   Laps 19   Minutes 17     Goals Met:  Exercise tolerated well No report of cardiac concerns or symptoms Strength training completed today  Goals Unmet:  Not Applicable  Comments: Service time is from 1030 to 1200    Dr. Rush Farmer is Medical Director for Pulmonary Rehab at [facility].

## 2016-10-16 ENCOUNTER — Telehealth (HOSPITAL_COMMUNITY): Payer: Self-pay | Admitting: Internal Medicine

## 2016-10-16 ENCOUNTER — Encounter (HOSPITAL_COMMUNITY): Payer: Non-veteran care

## 2016-10-21 ENCOUNTER — Encounter (HOSPITAL_COMMUNITY): Payer: Non-veteran care

## 2016-10-23 ENCOUNTER — Encounter (HOSPITAL_COMMUNITY): Payer: Non-veteran care

## 2016-10-28 ENCOUNTER — Encounter (HOSPITAL_COMMUNITY): Payer: Non-veteran care

## 2016-10-30 ENCOUNTER — Encounter (HOSPITAL_COMMUNITY): Payer: Non-veteran care

## 2016-11-04 ENCOUNTER — Encounter (HOSPITAL_COMMUNITY)
Admission: RE | Admit: 2016-11-04 | Discharge: 2016-11-04 | Disposition: A | Discharge status: Home / Self Care | Payer: Non-veteran care | Source: Ambulatory Visit | Attending: Pulmonary Disease | Admitting: Pulmonary Disease

## 2016-11-04 VITALS — Wt 233.9 lb

## 2016-11-04 DIAGNOSIS — J438 Other emphysema: Secondary | ICD-10-CM | POA: Diagnosis not present

## 2016-11-04 NOTE — Progress Notes (Signed)
Pulmonary Individual Treatment Plan  Patient Details  Name: Mark Robertson MRN: [id number] Date of Birth: 1941-09-25 Referring Provider:   [name] Pulmonary Rehab Walk Test from 07/10/2016 in [place]  Referring Provider  [name] ([place])]      Initial Encounter Date:  Flowsheet Row Pulmonary Rehab Walk Test from 07/10/2016 in [place]  Date  07/10/16  Referring Provider  Dr.Yacoub Gwenette Greet ([place])]      Visit Diagnosis: Other emphysema ([place])  Patient's Home Medications on Admission:   Current Outpatient Prescriptions:  .  amoxicillin (AMOXIL) 500 MG tablet, Take 2,000 mg by mouth once as needed (He is to take prior to dental procedures.)., Disp: , Rfl:  .  aspirin EC 81 MG tablet, Take 81 mg by mouth every evening., Disp: , Rfl:  .  BREO ELLIPTA 100-25 MCG/INH AEPB, Inhale 1 puff into the lungs daily. , Disp: , Rfl:  .  Cholecalciferol (VITAMIN D) 2000 UNITS tablet, Take 2,000 Units by mouth daily., Disp: , Rfl:  .  flunisolide (NASALIDE) 0.025 % SOLN, Inhale 1 spray into the lungs daily as needed (For allergies.). , Disp: , Rfl:  .  gabapentin (NEURONTIN) 300 MG capsule, Take 300 mg by mouth 2 (two) times daily., Disp: , Rfl:  .  losartan (COZAAR) 50 MG tablet, Take 50 mg by mouth daily with breakfast. , Disp: , Rfl:  .  Menthol, Topical Analgesic, (ICY HOT EX), Apply 1 application topically daily as needed (For pain.)., Disp: , Rfl:  .  Multiple Vitamins-Minerals (CENTRUM SILVER) tablet, Take 1 tablet by mouth daily with breakfast., Disp: , Rfl:  .  naproxen sodium (ANAPROX) 220 MG tablet, Take 220-440 mg by mouth 2 (two) times daily with a meal. He takes two tablets in the morning and one tablet in the evening., Disp: , Rfl:  .  Polyethyl Glycol-Propyl Glycol (SYSTANE) 0.4-0.3 % SOLN, Place 1-2 drops into both eyes daily as needed (For dry eyes.)., Disp: , Rfl:  .  ranitidine (ZANTAC) 150 MG tablet, Take  150 mg by mouth daily., Disp: , Rfl:  .  rosuvastatin (CRESTOR) 10 MG tablet, Take 10 mg by mouth every evening., Disp: , Rfl:  .  Sildenafil Citrate (VIAGRA PO), Take 1 tablet by mouth daily as needed (For erectile dysfunction.)., Disp: , Rfl:  .  traZODone (DESYREL) 50 MG tablet, Take 50 mg by mouth at bedtime., Disp: , Rfl:   Past Medical History: Past Medical History:  Diagnosis Date  . Cavitary pneumonia 09/29/2014  . COPD (chronic obstructive pulmonary disease) ([place])   . Depression   . Esophageal dysmotility 09/30/2014  . Esophageal thickening 09/29/2014  . Hiatal hernia 09/30/2014  . Hypercholesteremia   . Hypertension     Tobacco Use: History  Smoking Status  . Former Smoker  . Packs/day: 2.00  . Years: 50.00  . Types: Cigarettes  . Quit date: 10/13/2001  Smokeless Tobacco  . Not on file    Labs: Recent Review Flowsheet Data    Labs for ITP Cardiac and Pulmonary Rehab Latest Ref Rng & Units 09/29/2014   TCO2 0 - 100 mmol/L 33      Capillary Blood Glucose: No results found for: GLUCAP   ADL UCSD:   Pulmonary Function Assessment:     Pulmonary Function Assessment - 07/04/16 1030      Breath   Bilateral Breath Sounds Clear   Shortness of Breath No  resolved with Memory Dance  Exercise Target Goals:    Exercise Program Goal: Individual exercise prescription set with THRR, safety & activity barriers. Participant demonstrates ability to understand and report RPE using BORG scale, to self-measure pulse accurately, and to acknowledge the importance of the exercise prescription.  Exercise Prescription Goal: Starting with aerobic activity 30 plus minutes a day, 3 days per week for initial exercise prescription. Provide home exercise prescription and guidelines that participant acknowledges understanding prior to discharge.  Activity Barriers & Risk Stratification:     Activity Barriers & Cardiac Risk Stratification - 07/04/16 1030      Activity Barriers &  Cardiac Risk Stratification   Activity Barriers Left Knee Replacement;Right Knee Replacement;Deconditioning;Shortness of Breath;Neck/Spine Problems      6 Minute Walk:     6 Minute Walk    Row Name 07/10/16 1601 09/25/16 1417       6 Minute Walk   Phase Initial Mid Program    Distance 800 feet 1200 feet    Walk Time 6 minutes 6 minutes    # of Rest Breaks 0 0    MPH 1.5 2.27    METS 2.15 2.68    RPE 11 11    Perceived Dyspnea  0 1    Symptoms No No    Resting HR 81 bpm 97 bpm    Resting BP 140/82 164/70    Max Ex. HR 98 bpm 122 bpm    Max Ex. BP 164/80 190/90    2 Minute Post BP 140/84 150/90      Interval HR   Baseline HR 81 97    1 Minute HR 86 102    2 Minute HR 97 113    3 Minute HR 98 114    4 Minute HR 93 114    5 Minute HR 94 118    6 Minute HR 112 122    2 Minute Post HR 90 118    Interval Heart Rate? Yes Yes      Interval Oxygen   Interval Oxygen? Yes Yes    Baseline Oxygen Saturation % 92 % 91 %    Baseline Liters of Oxygen 0 L 0 L    1 Minute Oxygen Saturation % 85 % 85 %    1 Minute Liters of Oxygen 0 L 0 L    2 Minute Oxygen Saturation % 94 % 91 %    2 Minute Liters of Oxygen 2 L 0 L    3 Minute Oxygen Saturation % 90 % 92 %    3 Minute Liters of Oxygen 2 L 0 L    4 Minute Oxygen Saturation % 93 % 90 %    4 Minute Liters of Oxygen 2 L 0 L    5 Minute Oxygen Saturation % 92 % 90 %    5 Minute Liters of Oxygen 2 L 0 L    6 Minute Oxygen Saturation % 96 % 90 %    6 Minute Liters of Oxygen 2 L 0 L    2 Minute Post Oxygen Saturation % 98 % 94 %    2 Minute Post Liters of Oxygen 2 L  -       Initial Exercise Prescription:     Initial Exercise Prescription - 07/10/16 1600      Date of Initial Exercise RX and Referring Provider   Date 07/10/16   Referring Provider Dr.Yacoub  Clance ([place])     Oxygen   Oxygen Continuous   Liters 2  Bike   Level 0.5   Minutes 17     NuStep   Level 2   Minutes 17   METs 1.5     Track   Laps 5    Minutes 17     Prescription Details   Frequency (times per week) 2   Duration Progress to 45 minutes of aerobic exercise without signs/symptoms of physical distress     Intensity   THRR 40-80% of Max Heartrate 58-116   Ratings of Perceived Exertion 11-13   Perceived Dyspnea 0-4     Progression   Progression Continue progressive overload as per policy without signs/symptoms or physical distress.     Resistance Training   Training Prescription Yes   Weight GREEN BAND   Reps 10-12      Perform Capillary Blood Glucose checks as needed.  Exercise Prescription Changes:     Exercise Prescription Changes    Row Name 07/31/16 1200 08/05/16 1200 08/07/16 1313 08/14/16 1300 08/19/16 1200     Exercise Review   Progression  -  -  -  - Yes     Response to Exercise   Blood Pressure (Admit) 168/84 150/76 150/76 162/80 150/60   Blood Pressure (Exercise) 154/80 154/76 200/84  reck 190/93 186/80  left 181/91 right 190/93 180/90 manual 180/80   Blood Pressure (Exit) 142/66 144/72 152/86 170/88 142/62   Heart Rate (Admit) 88 bpm 80 bpm 81 bpm 89 bpm 85 bpm   Heart Rate (Exercise) 106 bpm 103 bpm 116 bpm 112 bpm 130 bpm   Heart Rate (Exit) 85 bpm 88 bpm 88 bpm 91 bpm 105 bpm   Oxygen Saturation (Admit) 96 % 98 % 94 % 89 % 94 %   Oxygen Saturation (Exercise) 93 % 95 % 95 % 112 % 94 %   Oxygen Saturation (Exit) 97 % 93 % 94 % 91 % 95 %   Rating of Perceived Exertion (Exercise) _0 Perceived Dyspnea (Exercise) 1 0 _1 Duration Progress to 45 minutes of aerobic exercise without signs/symptoms of physical distress Progress to 45 minutes of aerobic exercise without signs/symptoms of physical distress Progress to 45 minutes of aerobic exercise without signs/symptoms of physical distress Progress to 45 minutes of aerobic exercise without signs/symptoms of physical distress Progress to 45 minutes of aerobic exercise without signs/symptoms of physical distress   Intensity -  40-80% of  HRR THRR unchanged THRR unchanged THRR unchanged THRR unchanged     Resistance Training   Training Prescription _2    Weight _3 s   Reps 10-12  10 minutes of strength training 10-12  10 minutes of strength training 10-12  10 minutes of strength training 10-12  10 minutes of strength training 10-12     Interval Training   Interval Training _4      Oxygen   Oxygen _5    Liters  - _6 Bike   Level 0.5 1  -  - 1   Minutes 17 17  -  - 17     NuStep   Level 2 3  - 3 4   Minutes 17 17  - 17 17   METs 1.9 2.5  - 1.9 2.8     Track   Laps  - -  met with nutritionist during this station 13  11 12   Minutes  - _0 Row Name 08/21/16 1300 08/26/16 1200 08/28/16 1200 09/02/16 1100 09/09/16 1200     Exercise Review   Progression  - Yes  -  -  -     Response to Exercise   Blood Pressure (Admit) 154/60 154/64 160/70 150/74 154/80   Blood Pressure (Exercise) 160/76 166/84 220/90 166/80 184/90   Blood Pressure (Exit) 138/72 142/62 136/74 146/76 138/64   Heart Rate (Admit) 80 bpm 80 bpm 98 bpm 106 bpm 99 bpm   Heart Rate (Exercise) 122 bpm 122 bpm 111 bpm 113 bpm 117 bpm   Heart Rate (Exit) 100 bpm 100 bpm 93 bpm 105 bpm 106 bpm   Oxygen Saturation (Admit) 95 % 92 % 91 % 97 % 92 %   Oxygen Saturation (Exercise) 89 % 92 % 96 % 94 % 95 %   Oxygen Saturation (Exit) 95 % 94 % 95 % 94 % 93 %   Rating of Perceived Exertion (Exercise) _1 Perceived Dyspnea (Exercise) _2 0 0   Duration Progress to 45 minutes of aerobic exercise without signs/symptoms of physical distress Progress to 45 minutes of aerobic exercise without signs/symptoms of physical distress Progress to 45 minutes of aerobic exercise without signs/symptoms of physical distress Progress to 45 minutes of aerobic exercise without signs/symptoms of physical distress Progress to  45 minutes of aerobic exercise without signs/symptoms of physical distress   Intensity _3      Resistance Training   Training Prescription _4    Weight _5    Reps 10-12 10-12  10 minutes of sttength training 10-12  10 minutes of sttength training 10-12  10 minutes of sttength training 10-12  10 minutes of strength training     Interval Training   Interval Training _6      Oxygen   Oxygen _7    Liters _8 Bike   Level  - _9 Minutes  - _10 NuStep   Level _11 Minutes _12 METs 3.5 3.3 2.5 1.9 2.7     Track   Laps 14 16  - 14 13   Minutes 17 17  - 17 17   Row Name 09/11/16 1200 09/16/16 1200 09/18/16 1200 09/23/16 1200 09/25/16 1200     Exercise Review   Progression  - Yes  -  -  -     Response to Exercise   Blood Pressure (Admit) 160/80 140/60 150/70 164/70 168/80   Blood Pressure (Exercise) 170/90 180/84 186/84 154/70 168/70   Blood Pressure (Exit) 144/70 132/70 152/80 148/72 142/70   Heart Rate (Admit) 94 bpm 92 bpm 92 bpm 97 bpm 93 bpm   Heart Rate (Exercise) 120 bpm 128 bpm 115 bpm 123 bpm 130 bpm   Heart Rate (Exit) 102 bpm 104 bpm 95 bpm 106 bpm 104 bpm   Oxygen Saturation (Admit) 95 % 96 % 94 % 91 % 94 %   Oxygen Saturation (Exercise) 92 % 93 % 94 % 92 % 90 %   Oxygen Saturation (Exit) 92 % 93 % 94 % 92 % 95 %  Rating of Perceived Exertion (Exercise) _0 Perceived Dyspnea (Exercise) _1 Duration Progress to 45 minutes of aerobic exercise without signs/symptoms of physical distress Progress to 45 minutes of aerobic exercise without signs/symptoms of physical distress Progress to 45 minutes of aerobic exercise without signs/symptoms of physical distress Progress to 45 minutes of aerobic exercise  without signs/symptoms of physical distress Progress to 45 minutes of aerobic exercise without signs/symptoms of physical distress   Intensity _2      Progression   Progression Continue to progress workloads to maintain intensity without signs/symptoms of physical distress. Continue to progress workloads to maintain intensity without signs/symptoms of physical distress. Continue to progress workloads to maintain intensity without signs/symptoms of physical distress. Continue to progress workloads to maintain intensity without signs/symptoms of physical distress. Continue to progress workloads to maintain intensity without signs/symptoms of physical distress.     Resistance Training   Training Prescription _3    Weight _4    Reps 10-12  10 minutes of strength training 10-12  10 minutes of strength training 10-12  10 minutes of strength training 10-12  10 minutes of strength training 10-12  10 minutes of strength training     Interval Training   Interval Training _5      Oxygen   Oxygen Continuous Continuous Continuous Continuous -  Exercised on room air today tolerated well   Liters _6 -     Bike   Level _7 -   Minutes _8 -     NuStep   Level  - 6  - 6 6   Minutes  - 17  - 17 17   METs  - 3.4  - 3.2 3.5     Track   Laps _9 - 16   Minutes _10 Row Name 09/30/16 1200 10/02/16 1200 10/07/16 1200 10/14/16 1200       Exercise Review   Progression  -  -  - Yes      Response to Exercise   Blood Pressure (Admit) 146/72 166/84 156/70 154/72    Blood Pressure (Exercise) 182/78 180/80 172/90 170/80    Blood Pressure (Exit) 152/82 164/82 140/80 122/60    Heart Rate (Admit) 92 bpm 102 bpm 105 bpm 83 bpm    Heart Rate (Exercise) 145 bpm 120 bpm 129 bpm 129 bpm    Heart Rate (Exit) 101 bpm 105  bpm 105 bpm 106 bpm    Oxygen Saturation (Admit) 94 % 95 % 95 % 95 %    Oxygen Saturation (Exercise) 90 % 93 % 90 % 91 %    Oxygen Saturation (Exit) 93 % 90 % 93 % 93 %    Rating of Perceived Exertion (Exercise) _11 Perceived Dyspnea (Exercise) _12 Duration Progress to 45 minutes of aerobic exercise without signs/symptoms of physical distress Progress to 45 minutes of aerobic exercise without signs/symptoms of physical distress Progress to 45 minutes of aerobic exercise without signs/symptoms of physical distress Progress to 45 minutes of aerobic exercise without signs/symptoms of physical distress    Intensity THRR unchanged THRR unchanged THRR unchanged THRR unchanged      Progression  Progression Continue to progress workloads to maintain intensity without signs/symptoms of physical distress. Continue to progress workloads to maintain intensity without signs/symptoms of physical distress. Continue to progress workloads to maintain intensity without signs/symptoms of physical distress. Continue to progress workloads to maintain intensity without signs/symptoms of physical distress.      Resistance Training   Training Prescription Yes Yes Yes Yes    Weight green bands green bands green bands green bands    Reps 10-12  10 minutes of strength training 10-12  10 minutes of strength training 10-12  10 minutes of strength training 10-12  10 minutes of strength training      Interval Training   Interval Training No No No No      Oxygen   Oxygen -  Exercised on room air today tolerated well -  Exercised on room air today tolerated well Continuous Continuous    Liters  -  - 2 2      Bike   Level _0 Minutes _1 NuStep   Level _2 Minutes _3 METs 3.1 3.4 2.4 4      Track   Laps 19  - 18 19    Minutes 17  - 17 17       Exercise Comments:     Exercise Comments    Row Name 08/18/16 1627 09/15/16 0956 09/30/16 0848  11/03/16 1636     Exercise Comments Patient's blood pressure have been out of control. Has only attended four entire sessions. Blood pressure has been addressed by the doctor and medicines have been adjusted. Will cont. to monitor.  Patient's blood pressure is still out of control--Dr. has readjusted his medicine again. Will continue to monitor. Patient is improving slowly. Will discuss home exercise in the next week or two depending on BP, Patient's blood pressure is still running high. Discussed home exercise with patient. Patient went out and bought a pulse oximeter.  Patient is progressing well. He is walking up to 19 laps in 15 minutes. He is no longer on oxygen. MET level places him at a moderate workload.       Discharge Exercise Prescription (Final Exercise Prescription Changes):     Exercise Prescription Changes - 10/14/16 1200      Exercise Review   Progression Yes     Response to Exercise   Blood Pressure (Admit) 154/72   Blood Pressure (Exercise) 170/80   Blood Pressure (Exit) 122/60   Heart Rate (Admit) 83 bpm   Heart Rate (Exercise) 129 bpm   Heart Rate (Exit) 106 bpm   Oxygen Saturation (Admit) 95 %   Oxygen Saturation (Exercise) 91 %   Oxygen Saturation (Exit) 93 %   Rating of Perceived Exertion (Exercise) 13   Perceived Dyspnea (Exercise) 2   Duration Progress to 45 minutes of aerobic exercise without signs/symptoms of physical distress   Intensity THRR unchanged     Progression   Progression Continue to progress workloads to maintain intensity without signs/symptoms of physical distress.     Resistance Training   Training Prescription Yes   Weight green bands   Reps 10-12  10 minutes of strength training     Interval Training   Interval Training No     Oxygen   Oxygen Continuous   Liters 2     Bike   Level 1  Minutes 17     NuStep   Level 7   Minutes 17   METs 4     Track   Laps 19   Minutes 17      Nutrition:  Target Goals:  Understanding of nutrition guidelines, daily intake of sodium <1562m, cholesterol <2051m calories 30% from fat and 7% or less from saturated fats, daily to have 5 or more servings of fruits and vegetables.  Biometrics:     Pre Biometrics - 07/10/16 1619      Pre Biometrics   Grip Strength 36 kg       Nutrition Therapy Plan and Nutrition Goals:     Nutrition Therapy & Goals - 08/05/16 1231      Nutrition Therapy   Diet General, Healthful      Personal Nutrition Goals   Personal Goal #1 1-2 lb wt loss/week to a wt loss goal of 6-24 lb [place] individualized specific dietary modifications aiming towards targeted core components such as weight, hypertension, lipid management, diabetes, heart failure and other comorbidities.   Expected Outcomes Short Term Goal: Understand basic principles of dietary content, such as calories, fat, sodium, cholesterol and nutrients.;Long Term Goal: Adherence to prescribed nutrition plan.      Nutrition Discharge: Rate Your Plate Scores:     Nutrition Assessments - 08/05/16 1231      Rate Your Plate Scores   Pre Score 54      Psychosocial: Target Goals: Acknowledge presence or absence of depression, maximize coping skills, provide positive support system. Participant is able to verbalize types and ability to use techniques and skills needed for reducing stress and depression.  Initial Review & Psychosocial Screening:     Initial Psych Review & Screening - 07/04/16 1036      Initial Review   Current issues with History of Depression     Family Dynamics   Good Support System? Yes     Barriers   Psychosocial barriers to participate in program Psychosocial barriers identified (see note)     Screening Interventions   Interventions Encouraged to exercise      Quality of Life Scores:   PHQ-9: Recent Review Flowsheet Data     [facility] 07[facility]   Decreased Interest 0   Down, Depressed, Hopeless 0   PHQ - 2 Score 0      Psychosocial Evaluation and Intervention:     Psychosocial Evaluation - 07/04/16 1145      Psychosocial Evaluation & Interventions   Interventions Encouraged to exercise with the program and follow exercise prescription      Psychosocial Re-Evaluation:     Psychosocial Re-Evaluation    [place] 08/19/16 0650 09/16/16 0713 10/07/16 0822 11/04/16 0954       Psychosocial Re-Evaluation   Interventions Encouraged to attend Pulmonary Rehabilitation for the exercise Encouraged to attend Pulmonary Rehabilitation for the exercise Encouraged to attend Pulmonary Rehabilitation for the exercise Encouraged to attend Pulmonary Rehabilitation for the exercise    Comments see comments on ITP see comments on ITP no psychosocial barriers identified during the past 30 days no psychosocial barriers identified during the past 30 days       Education: Education Goals: Education classes will be provided on a weekly basis, covering required topics. Participant will state understanding/return demonstration of topics presented.  Learning Barriers/Preferences:     Learning Barriers/Preferences - 07/04/16 1030  Learning Barriers/Preferences   Learning Barriers None   Learning Preferences Computer/Internet;Group Instruction;Individual Instruction;Skilled Demonstration;Verbal Instruction;Written Material;Video      Education Topics: How Lungs Work and Diseases: - Discuss the anatomy of the lungs and diseases that can affect the lungs, such as COPD.   Exercise: -Discuss the importance of exercise, FITT principles of exercise, normal and abnormal responses to exercise, and how to exercise safely.   Environmental Irritants: -Discuss types of environmental irritants and how to limit exposure to environmental irritants.   Meds/Inhalers and oxygen: - Discuss respiratory  medications, definition of an inhaler and oxygen, and the proper way to use an inhaler and oxygen.   Energy Saving Techniques: - Discuss methods to conserve energy and decrease shortness of breath when performing activities of daily living.    Bronchial Hygiene / Breathing Techniques: - Discuss breathing mechanics, pursed-lip breathing technique,  proper posture, effective ways to clear airways, and other functional breathing techniques   Cleaning Equipment: - Provides group verbal and written instruction about the health risks of elevated stress, cause of high stress, and healthy ways to reduce stress.   Nutrition I: Fats: - Discuss the types of cholesterol, what cholesterol does to the body, and how cholesterol levels can be controlled.   Nutrition II: Labels: -Discuss the different components of food labels and how to read food labels.   Respiratory Infections: - Discuss the signs and symptoms of respiratory infections, ways to prevent respiratory infections, and the importance of seeking medical treatment when having a respiratory infection.   Stress I: Signs and Symptoms: - Discuss the causes of stress, how stress may lead to anxiety and depression, and ways to limit stress.   Stress II: Relaxation: -Discuss relaxation techniques to limit stress.   Oxygen for Home/Travel: - Discuss how to prepare for travel when on oxygen and proper ways to transport and store oxygen to ensure safety.   Knowledge Questionnaire Score:   Core Components/Risk Factors/Patient Goals at Admission:     Personal Goals and Risk Factors at Admission - 07/04/16 1142      Core Components/Risk Factors/Patient Goals on Admission   Stress --  Patient feels he is not stressed however he verbalizes multiple times how hard it is to raise a teenage boy at his age.      Core Components/Risk Factors/Patient Goals Review:      Goals and Risk Factor Review    Row Name 08/19/16 0649 09/16/16  0713 10/07/16 0821 11/04/16 0954       Core Components/Risk Factors/Patient Goals Review   Personal Goals Review Weight Management/Obesity;Increase Strength and Stamina;Increase knowledge of respiratory medications and ability to use respiratory devices properly.;Develop more efficient breathing techniques such as purse lipped breathing and diaphragmatic breathing and practicing self-pacing with activity.;Stress Weight Management/Obesity;Increase Strength and Stamina;Increase knowledge of respiratory medications and ability to use respiratory devices properly.;Develop more efficient breathing techniques such as purse lipped breathing and diaphragmatic breathing and practicing self-pacing with activity.;Stress Weight Management/Obesity;Increase Strength and Stamina;Increase knowledge of respiratory medications and ability to use respiratory devices properly.;Develop more efficient breathing techniques such as purse lipped breathing and diaphragmatic breathing and practicing self-pacing with activity.;Stress Weight Management/Obesity;Increase Strength and Stamina;Increase knowledge of respiratory medications and ability to use respiratory devices properly.;Develop more efficient breathing techniques such as purse lipped breathing and diaphragmatic breathing and practicing self-pacing with activity.;Stress    Review see comments section on ITP see comments section on ITP see comments section on ITP see comments section on ITP  Expected Outcomes see admission expected outcomes see admission expected outcomes see admission expected outcomes see admission expected outcomes       Core Components/Risk Factors/Patient Goals at Discharge (Final Review):      Goals and Risk Factor Review - 11/04/16 0954      Core Components/Risk Factors/Patient Goals Review   Personal Goals Review Weight Management/Obesity;Increase Strength and Stamina;Increase knowledge of respiratory medications and ability to use  respiratory devices properly.;Develop more efficient breathing techniques such as purse lipped breathing and diaphragmatic breathing and practicing self-pacing with activity.;Stress   Review see comments section on ITP   Expected Outcomes see admission expected outcomes      ITP Comments:   Comments: ITP REVIEW Pt was making expected progress toward pulmonary rehab goals after completing 21 sessions. However he has not attended an exercise session since 10/14/16 because of a foot injury. It is anticipated that the patient return to rehab today. Will continue to follow and adjust workloads as needed. Recommend continued exercise, life style modification, education, and utilization of breathing techniques to increase stamina and strength and decrease shortness of breath with exertion.

## 2016-11-04 NOTE — Progress Notes (Signed)
Daily Session Note  Patient Details  Name: Mark Robertson MRN: [id number] Date of Birth: 04-Jun-1941 Referring Provider:   [name] Pulmonary Rehab Walk Test from 07/10/2016 in [place]  Referring Provider  [name] ([place])]      Encounter Date: 11/04/2016  Check In:     Session Check In - 11/04/16 1023      Check-In   Location [facility]   Staff Present Rosebud Poles, RN, BSN;Kagan Mutchler, MS, ACSM RCEP, Exercise Physiologist;Lisa Ysidro Evert, RN;Portia Rollene Rotunda, RN, BSN   Supervising physician immediately available to respond to emergencies [facility] immediately available   Physician(s) Dr. Wyline Copas   Medication changes reported     No   Fall or balance concerns reported    No   Warm-up and Cool-down Performed as group-led instruction   Resistance Training Performed Yes   VAD Patient? No     Pain Assessment   Currently in Pain? No/denies   Multiple Pain Sites No      Capillary Blood Glucose: No results found for this or any previous visit (from the past 24 hour(s)).      Exercise Prescription Changes - 11/04/16 1200      Exercise Review   Progression Yes     Response to Exercise   Blood Pressure (Admit) 140/60   Blood Pressure (Exercise) 150/70   Blood Pressure (Exit) 122/68   Heart Rate (Admit) 103 bpm   Heart Rate (Exercise) 128 bpm   Heart Rate (Exit) 105 bpm   Oxygen Saturation (Admit) 95 %   Oxygen Saturation (Exercise) 89 %   Oxygen Saturation (Exit) 91 %   Rating of Perceived Exertion (Exercise) 13   Perceived Dyspnea (Exercise) 1   Duration Progress to 45 minutes of aerobic exercise without signs/symptoms of physical distress   Intensity THRR unchanged     Progression   Progression Continue to progress workloads to maintain intensity without signs/symptoms of physical distress.     Resistance Training   Training Prescription Yes   Weight green bands   Reps 10-12  10 minutes of  strength training     Interval Training   Interval Training No     Oxygen   Oxygen Continuous   Liters 2     Bike   Level 1   Minutes 17     NuStep   Level 7   Minutes 17   METs 3.4     Track   Laps 16   Minutes 17     Goals Met:  Exercise tolerated well No report of cardiac concerns or symptoms Strength training completed today  Goals Unmet:  Not Applicable  Comments: Service time is from 10:30am to 12:00pm    Dr. Rush Farmer is Medical Director for Pulmonary Rehab at [facility].

## 2016-11-06 ENCOUNTER — Encounter (HOSPITAL_COMMUNITY)
Admission: RE | Admit: 2016-11-06 | Discharge: 2016-11-06 | Disposition: A | Discharge status: Home / Self Care | Payer: Non-veteran care | Source: Ambulatory Visit | Attending: Pulmonary Disease | Admitting: Pulmonary Disease

## 2016-11-06 VITALS — Wt 234.3 lb

## 2016-11-06 DIAGNOSIS — J438 Other emphysema: Secondary | ICD-10-CM | POA: Diagnosis not present

## 2016-11-06 NOTE — Progress Notes (Signed)
Daily Session Note  Patient Details  Name: Mark Robertson MRN: [id number] Date of Birth: [birth year] Referring Provider:   [name] Pulmonary Rehab Walk Test from 07/10/2016 in [place]  Referring Provider  [name] ([place])]      Encounter Date: 11/06/2016  Check In:     Session Check In - 11/06/16 1030      Check-In   [facility]   Staff Present Su Hilt, MS, ACSM RCEP, Exercise Physiologist;Lisa Ysidro Evert, RN;Portia Rollene Rotunda, Therapist, sports, BSN;Ramon Dredge, RN, [facility]   Supervising physician immediately available to respond to emergencies [facility] immediately available   Physician(s) Dr. Algis Liming   Medication changes reported     No   Fall or balance concerns reported    No   Warm-up and Cool-down Performed as group-led instruction   Resistance Training Performed Yes   VAD Patient? No     Pain Assessment   Currently in Pain? No/denies   Multiple Pain Sites No      Capillary Blood Glucose: No results found for this or any previous visit (from the past 24 hour(s)).      Exercise Prescription Changes - 11/06/16 1200      Response to Exercise   Blood Pressure (Admit) 148/72   Blood Pressure (Exercise) 184/80   Blood Pressure (Exit) 138/74   Heart Rate (Admit) 84 bpm   Heart Rate (Exercise) 128 bpm   Heart Rate (Exit) 103 bpm   Oxygen Saturation (Admit) 92 %   Oxygen Saturation (Exercise) 88 %   Oxygen Saturation (Exit) 92 %   Rating of Perceived Exertion (Exercise) 13   Perceived Dyspnea (Exercise) 3   Duration Progress to 45 minutes of aerobic exercise without signs/symptoms of physical distress   Intensity THRR unchanged     Progression   Progression Continue to progress workloads to maintain intensity without signs/symptoms of physical distress.     Resistance Training   Training Prescription Yes   Weight green bands   Reps 10-12  10 minutes of strength training     Interval  Training   Interval Training No     Oxygen   Oxygen Continuous   Liters 2     NuStep   Level 7   Minutes 17   METs 3.7     Track   Laps 20   Minutes 17     Goals Met:  Exercise tolerated well No report of cardiac concerns or symptoms Strength training completed today  Goals Unmet:  Not Applicable  Comments: Service time is from 10:30a to 12:05p    Dr. Rush Farmer is Medical Director for Pulmonary Rehab at [facility].

## 2016-11-11 ENCOUNTER — Encounter (HOSPITAL_COMMUNITY)
Admission: RE | Admit: 2016-11-11 | Discharge: 2016-11-11 | Disposition: A | Discharge status: Home / Self Care | Payer: Non-veteran care | Source: Ambulatory Visit | Attending: Pulmonary Disease | Admitting: Pulmonary Disease

## 2016-11-11 VITALS — Wt 235.9 lb

## 2016-11-11 DIAGNOSIS — J438 Other emphysema: Secondary | ICD-10-CM | POA: Diagnosis not present

## 2016-11-11 NOTE — Progress Notes (Signed)
Daily Session Note  Patient Details  Name: Mark Robertson MRN: [id number] Date of Birth: [birth year] Referring Provider:   [name] Pulmonary Rehab Walk Test from 07/10/2016 in [place]  Referring Provider  [name] ([place]      Encounter Date: 11/11/2016  Check In:     Session Check In - 11/11/16 1023      Check-In   [facility]   Staff Present Rosebud Poles, RN, BSN;Molly diVincenzo, MS, ACSM RCEP, Exercise Physiologist;Lisa Ysidro Evert, RN;Derion Kreiter Rollene Rotunda, RN, BSN   Supervising physician immediately available to respond to emergencies [facility] immediately available   Physician(s) Dr. Posey Pronto   Medication changes reported     No   Fall or balance concerns reported    No   Warm-up and Cool-down Performed as group-led instruction   Resistance Training Performed Yes   VAD Patient? No     Pain Assessment   Currently in Pain? No/denies   Multiple Pain Sites No      Capillary Blood Glucose: No results found for this or any previous visit (from the past 24 hour(s)).      Exercise Prescription Changes - 11/11/16 1212      Response to Exercise   Blood Pressure (Admit) 146/66   Blood Pressure (Exercise) 160/80   Blood Pressure (Exit) 104/60   Heart Rate (Admit) 84 bpm   Heart Rate (Exercise) 116 bpm   Heart Rate (Exit) 104 bpm   Oxygen Saturation (Admit) 95 %   Oxygen Saturation (Exercise) 2 %   Oxygen Saturation (Exit) 91 %   Rating of Perceived Exertion (Exercise) 13   Perceived Dyspnea (Exercise) 2   Duration Progress to 45 minutes of aerobic exercise without signs/symptoms of physical distress   Intensity THRR unchanged     Progression   Progression Continue to progress workloads to maintain intensity without signs/symptoms of physical distress.     Resistance Training   Training Prescription Yes   Weight green bands   Reps 10-12  10 minutes of strength training     Interval Training   Interval Training No     Oxygen   Oxygen Continuous   Liters 2     Bike   Level 1   Minutes 17     NuStep   Level 7   Minutes 17   METs 3.7     Track   Laps 17   Minutes 17     Goals Met:  Independence with exercise equipment Improved SOB with ADL's Using PLB without cueing & demonstrates good technique Exercise tolerated well No report of cardiac concerns or symptoms Strength training completed today  Goals Unmet:  Not Applicable  Comments: Service time is from 1030 to 1205   Dr. Rush Farmer is Medical Director for Pulmonary Rehab at [facility].

## 2016-11-13 ENCOUNTER — Encounter (HOSPITAL_COMMUNITY)
Admission: RE | Admit: 2016-11-13 | Discharge: 2016-11-13 | Disposition: A | Discharge status: Home / Self Care | Payer: Non-veteran care | Source: Ambulatory Visit | Attending: Pulmonary Disease | Admitting: Pulmonary Disease

## 2016-11-13 VITALS — Wt 234.8 lb

## 2016-11-13 DIAGNOSIS — E7800 Pure hypercholesterolemia, unspecified: Secondary | ICD-10-CM | POA: Diagnosis not present

## 2016-11-13 DIAGNOSIS — J449 Chronic obstructive pulmonary disease, unspecified: Secondary | ICD-10-CM | POA: Diagnosis not present

## 2016-11-13 DIAGNOSIS — J438 Other emphysema: Secondary | ICD-10-CM | POA: Insufficient documentation

## 2016-11-13 DIAGNOSIS — F329 Major depressive disorder, single episode, unspecified: Secondary | ICD-10-CM | POA: Diagnosis not present

## 2016-11-13 DIAGNOSIS — K224 Dyskinesia of esophagus: Secondary | ICD-10-CM | POA: Insufficient documentation

## 2016-11-13 DIAGNOSIS — K449 Diaphragmatic hernia without obstruction or gangrene: Secondary | ICD-10-CM | POA: Insufficient documentation

## 2016-11-13 DIAGNOSIS — I10 Essential (primary) hypertension: Secondary | ICD-10-CM | POA: Insufficient documentation

## 2016-11-13 NOTE — Progress Notes (Signed)
Daily Session Note  Patient Details  Name: Mark Robertson MRN: [id number] Date of Birth: 05-09-1941 Referring Provider:   [name] Pulmonary Rehab Walk Test from 07/10/2016 in [place]  Referring Provider  [name] ([place]      Encounter Date: 11/13/2016  Check In:     Session Check In - 11/13/16 1030      Check-In   [facility]   Staff Present Rosebud Poles, RN, BSN;Lisa Ysidro Evert, RN;Molly Savarino Rollene Rotunda, RN, BSN   Supervising physician immediately available to respond to emergencies [facility] immediately available   Physician(s) Dr. Broadus John   Medication changes reported     No   Warm-up and Cool-down Performed as group-led instruction   Resistance Training Performed Yes   VAD Patient? No     Pain Assessment   Currently in Pain? No/denies   Multiple Pain Sites No      Capillary Blood Glucose: No results found for this or any previous visit (from the past 24 hour(s)).      Exercise Prescription Changes - 11/13/16 1253      Response to Exercise   Blood Pressure (Admit) 140/82   Blood Pressure (Exercise) 164/70   Blood Pressure (Exit) 132/62   Heart Rate (Admit) 74 bpm   Heart Rate (Exercise) 122 bpm   Heart Rate (Exit) 100 bpm   Oxygen Saturation (Admit) 92 %   Oxygen Saturation (Exercise) 90 %   Oxygen Saturation (Exit) 92 %   Rating of Perceived Exertion (Exercise) 11   Perceived Dyspnea (Exercise) 1   Duration Progress to 45 minutes of aerobic exercise without signs/symptoms of physical distress   Intensity THRR unchanged     Progression   Progression Continue to progress workloads to maintain intensity without signs/symptoms of physical distress.     Resistance Training   Training Prescription Yes   Weight green bands   Reps 10-12  10 minutes of strength training     Interval Training   Interval Training No     Oxygen   Oxygen Continuous   Liters 2     Bike   Level 1   Minutes 17     NuStep   Level 7   Minutes 17   METs 3.5     Goals Met:  Improved SOB with ADL's Using PLB without cueing & demonstrates good technique Exercise tolerated well No report of cardiac concerns or symptoms Strength training completed today  Goals Unmet:  Not Applicable  Comments: Service time is from 1030 to 1230   Dr. Rush Farmer is Medical Director for Pulmonary Rehab at [facility].

## 2016-11-18 ENCOUNTER — Encounter (HOSPITAL_COMMUNITY)
Admission: RE | Admit: 2016-11-18 | Discharge: 2016-11-18 | Disposition: A | Discharge status: Home / Self Care | Payer: Non-veteran care | Source: Ambulatory Visit | Attending: Pulmonary Disease | Admitting: Pulmonary Disease

## 2016-11-18 ENCOUNTER — Encounter (INDEPENDENT_AMBULATORY_CARE_PROVIDER_SITE_OTHER): Payer: Self-pay

## 2016-11-18 VITALS — Wt 233.0 lb

## 2016-11-18 DIAGNOSIS — J438 Other emphysema: Secondary | ICD-10-CM

## 2016-11-18 NOTE — Progress Notes (Signed)
Daily Session Note  Patient Details  Name: Mark Robertson MRN: [id number] Date of Birth: February 09, 1941 Referring Provider:   [name] Pulmonary Rehab Walk Test from 07/10/2016 in [place]  Referring Provider  [name] ([place])]      Encounter Date: 11/18/2016  Check In:     Session Check In - 11/18/16 1030      Check-In   [facility]   Staff Present Rosebud Poles, RN, BSN;Molly diVincenzo, MS, ACSM RCEP, Exercise Physiologist;Lisa Ysidro Evert, RN;Emberlynn Riggan Rollene Rotunda, RN, BSN   Supervising physician immediately available to respond to emergencies [facility] immediately available   Physician(s) Dr. Cathlean Sauer   Medication changes reported     No   Fall or balance concerns reported    No   Warm-up and Cool-down Performed as group-led instruction   Resistance Training Performed Yes   VAD Patient? No     Pain Assessment   Currently in Pain? No/denies   Multiple Pain Sites No      Capillary Blood Glucose: No results found for this or any previous visit (from the past 24 hour(s)).      Exercise Prescription Changes - 11/18/16 1229      Exercise Review   Progression Yes     Response to Exercise   Blood Pressure (Admit) 120/60   Blood Pressure (Exercise) 180/90   Blood Pressure (Exit) 130/70   Heart Rate (Admit) 92 bpm   Heart Rate (Exercise) 142 bpm   Heart Rate (Exit) 110 bpm   Oxygen Saturation (Admit) 94 %   Oxygen Saturation (Exercise) 90 %   Oxygen Saturation (Exit) 94 %   Rating of Perceived Exertion (Exercise) 13   Perceived Dyspnea (Exercise) 2   Duration Progress to 45 minutes of aerobic exercise without signs/symptoms of physical distress   Intensity THRR unchanged     Progression   Progression Continue to progress workloads to maintain intensity without signs/symptoms of physical distress.     Resistance Training   Training Prescription Yes   Weight green bands   Reps 10-12  10 minutes of  strength training     Interval Training   Interval Training No     Oxygen   Oxygen Continuous   Liters 2     Bike   Level 1.4   Minutes 17     NuStep   Level 8   Minutes 17   METs 3.8     Track   Laps 16   Minutes 17     Goals Met:  Independence with exercise equipment Improved SOB with ADL's Using PLB without cueing & demonstrates good technique No report of cardiac concerns or symptoms Strength training completed today  Goals Unmet:  HR  Comments: Service time is from 1030 to 1205   Dr. Rush Farmer is Medical Director for Pulmonary Rehab at [facility].

## 2016-11-20 ENCOUNTER — Encounter (HOSPITAL_COMMUNITY)
Admission: RE | Admit: 2016-11-20 | Discharge: 2016-11-20 | Disposition: A | Discharge status: Home / Self Care | Payer: Non-veteran care | Source: Ambulatory Visit | Attending: Pulmonary Disease | Admitting: Pulmonary Disease

## 2016-11-20 VITALS — Wt 235.7 lb

## 2016-11-20 DIAGNOSIS — J438 Other emphysema: Secondary | ICD-10-CM

## 2016-11-20 NOTE — Progress Notes (Addendum)
Daily Session Note  Patient Details  Name: Mark Robertson MRN: [id number] Date of Birth: 11-26-1940 Referring Provider:   [name] Pulmonary Rehab Walk Test from 07/10/2016 in [place]  Referring Provider  [name] ([place]      Encounter Date: 11/20/2016  Check In:     Session Check In - 11/20/16 1027      Check-In   Location [facility]   Staff Present Rosebud Poles, RN, BSN;Molly diVincenzo, MS, ACSM RCEP, Exercise Physiologist;Lisa Ysidro Evert, RN;Portia Rollene Rotunda, RN, BSN   Supervising physician immediately available to respond to emergencies [facility] immediately available   Physician(s) Dr. Algis Liming   Medication changes reported     No   Fall or balance concerns reported    No   Warm-up and Cool-down Performed as group-led instruction   Resistance Training Performed Yes   VAD Patient? No     Pain Assessment   Currently in Pain? No/denies   Multiple Pain Sites No      Capillary Blood Glucose: No results found for this or any previous visit (from the past 24 hour(s)).      Exercise Prescription Changes - 11/20/16 1200      Response to Exercise   Blood Pressure (Admit) 140/70   Blood Pressure (Exercise) 140/72   Blood Pressure (Exit) 118/70   Heart Rate (Admit) 72 bpm   Heart Rate (Exercise) 132 bpm   Heart Rate (Exit) 103 bpm   Oxygen Saturation (Admit) 96 %   Oxygen Saturation (Exercise) 91 %   Oxygen Saturation (Exit) 93 %   Rating of Perceived Exertion (Exercise) 13   Perceived Dyspnea (Exercise) 1   Duration Progress to 45 minutes of aerobic exercise without signs/symptoms of physical distress   Intensity THRR unchanged     Progression   Progression Continue to progress workloads to maintain intensity without signs/symptoms of physical distress.     Resistance Training   Training Prescription Yes   Weight green bands   Reps 10-12  10 minutes of strength training     Interval Training    Interval Training No     Oxygen   Oxygen Continuous   Liters 2     NuStep   Level 8   Minutes 17   METs 4.1     Track   Laps 16   Minutes 17     Goals Met:  Exercise tolerated well Strength training completed today  Goals Unmet:  Not Applicable  Comments: Service time is from 1030 to 4  Attended MD doctor day today.  Dr. Rush Farmer is Medical Director for Pulmonary Rehab at [facility].

## 2016-11-25 ENCOUNTER — Encounter (HOSPITAL_COMMUNITY)
Admission: RE | Admit: 2016-11-25 | Discharge: 2016-11-25 | Disposition: A | Discharge status: Home / Self Care | Payer: Non-veteran care | Source: Ambulatory Visit | Attending: Pulmonary Disease | Admitting: Pulmonary Disease

## 2016-11-25 VITALS — Wt 236.1 lb

## 2016-11-25 DIAGNOSIS — J438 Other emphysema: Secondary | ICD-10-CM

## 2016-11-25 NOTE — Progress Notes (Signed)
Daily Session Note  Patient Details  Name: Mark Robertson MRN: [id number] Date of Birth: 1941/07/16 Referring Provider:   [name] Pulmonary Rehab Walk Test from 07/10/2016 in [place]  Referring Provider  [name] ([place]      Encounter Date: 11/25/2016  Check In:     Session Check In - 11/25/16 1030      Check-In   Location [facility]   Staff Present Rosebud Poles, RN, BSN;Molly diVincenzo, MS, ACSM RCEP, Exercise Physiologist;Brianda Beitler Ysidro Evert, RN;Portia Rollene Rotunda, RN, BSN   Supervising physician immediately available to respond to emergencies [facility] immediately available   Physician(s) Dr. Posey Pronto   Medication changes reported     No   Fall or balance concerns reported    No   Warm-up and Cool-down Performed as group-led instruction   Resistance Training Performed Yes   VAD Patient? No     Pain Assessment   Currently in Pain? No/denies   Multiple Pain Sites No      Capillary Blood Glucose: No results found for this or any previous visit (from the past 24 hour(s)).      Exercise Prescription Changes - 11/25/16 1200      Response to Exercise   Blood Pressure (Admit) 136/72   Blood Pressure (Exercise) 156/72   Blood Pressure (Exit) 132/62   Heart Rate (Admit) 84 bpm   Heart Rate (Exercise) 125 bpm   Heart Rate (Exit) 103 bpm   Oxygen Saturation (Admit) 96 %   Oxygen Saturation (Exercise) 90 %   Oxygen Saturation (Exit) 94 %   Rating of Perceived Exertion (Exercise) 13   Perceived Dyspnea (Exercise) 1   Duration Progress to 45 minutes of aerobic exercise without signs/symptoms of physical distress   Intensity THRR unchanged     Progression   Progression Continue to progress workloads to maintain intensity without signs/symptoms of physical distress.     Resistance Training   Training Prescription Yes   Weight green bands   Reps 10-12  10 minutes of strength training     Interval Training   Interval Training No     Oxygen   Oxygen Continuous   Liters 2     Bike   Level 1.4   Minutes 17     NuStep   Level 8   Minutes 17   METs 3.8     Track   Laps 19   Minutes 17     Goals Met:  Exercise tolerated well No report of cardiac concerns or symptoms Strength training completed today  Goals Unmet:  Not Applicable  Comments: Service time is from 1030 to 1205    Dr. Rush Farmer is Medical Director for Pulmonary Rehab at [facility].

## 2016-11-25 NOTE — Progress Notes (Signed)
Pulmonary Individual Treatment Plan  Patient Details  Name: Mark Robertson MRN: [id number] Date of Birth: 1941-09-25 Referring Provider:   [name] Pulmonary Rehab Walk Test from 07/10/2016 in [place]  Referring Provider  [name] ([place])]      Initial Encounter Date:  Flowsheet Row Pulmonary Rehab Walk Test from 07/10/2016 in [place]  Date  07/10/16  Referring Provider  Dr.Yacoub Gwenette Greet ([place])]      Visit Diagnosis: Other emphysema ([place])  Patient's Home Medications on Admission:   Current Outpatient Prescriptions:  .  amoxicillin (AMOXIL) 500 MG tablet, Take 2,000 mg by mouth once as needed (He is to take prior to dental procedures.)., Disp: , Rfl:  .  aspirin EC 81 MG tablet, Take 81 mg by mouth every evening., Disp: , Rfl:  .  BREO ELLIPTA 100-25 MCG/INH AEPB, Inhale 1 puff into the lungs daily. , Disp: , Rfl:  .  Cholecalciferol (VITAMIN D) 2000 UNITS tablet, Take 2,000 Units by mouth daily., Disp: , Rfl:  .  flunisolide (NASALIDE) 0.025 % SOLN, Inhale 1 spray into the lungs daily as needed (For allergies.). , Disp: , Rfl:  .  gabapentin (NEURONTIN) 300 MG capsule, Take 300 mg by mouth 2 (two) times daily., Disp: , Rfl:  .  losartan (COZAAR) 50 MG tablet, Take 50 mg by mouth daily with breakfast. , Disp: , Rfl:  .  Menthol, Topical Analgesic, (ICY HOT EX), Apply 1 application topically daily as needed (For pain.)., Disp: , Rfl:  .  Multiple Vitamins-Minerals (CENTRUM SILVER) tablet, Take 1 tablet by mouth daily with breakfast., Disp: , Rfl:  .  naproxen sodium (ANAPROX) 220 MG tablet, Take 220-440 mg by mouth 2 (two) times daily with a meal. He takes two tablets in the morning and one tablet in the evening., Disp: , Rfl:  .  Polyethyl Glycol-Propyl Glycol (SYSTANE) 0.4-0.3 % SOLN, Place 1-2 drops into both eyes daily as needed (For dry eyes.)., Disp: , Rfl:  .  ranitidine (ZANTAC) 150 MG tablet, Take  150 mg by mouth daily., Disp: , Rfl:  .  rosuvastatin (CRESTOR) 10 MG tablet, Take 10 mg by mouth every evening., Disp: , Rfl:  .  Sildenafil Citrate (VIAGRA PO), Take 1 tablet by mouth daily as needed (For erectile dysfunction.)., Disp: , Rfl:  .  traZODone (DESYREL) 50 MG tablet, Take 50 mg by mouth at bedtime., Disp: , Rfl:   Past Medical History: Past Medical History:  Diagnosis Date  . Cavitary pneumonia 09/29/2014  . COPD (chronic obstructive pulmonary disease) ([place])   . Depression   . Esophageal dysmotility 09/30/2014  . Esophageal thickening 09/29/2014  . Hiatal hernia 09/30/2014  . Hypercholesteremia   . Hypertension     Tobacco Use: History  Smoking Status  . Former Smoker  . Packs/day: 2.00  . Years: 50.00  . Types: Cigarettes  . Quit date: 10/13/2001  Smokeless Tobacco  . Not on file    Labs: Recent Review Flowsheet Data    Labs for ITP Cardiac and Pulmonary Rehab Latest Ref Rng & Units 09/29/2014   TCO2 0 - 100 mmol/L 33      Capillary Blood Glucose: No results found for: GLUCAP   ADL UCSD:   Pulmonary Function Assessment:     Pulmonary Function Assessment - 07/04/16 1030      Breath   Bilateral Breath Sounds Clear   Shortness of Breath No  resolved with Memory Dance  Exercise Target Goals:    Exercise Program Goal: Individual exercise prescription set with THRR, safety & activity barriers. Participant demonstrates ability to understand and report RPE using BORG scale, to self-measure pulse accurately, and to acknowledge the importance of the exercise prescription.  Exercise Prescription Goal: Starting with aerobic activity 30 plus minutes a day, 3 days per week for initial exercise prescription. Provide home exercise prescription and guidelines that participant acknowledges understanding prior to discharge.  Activity Barriers & Risk Stratification:     Activity Barriers & Cardiac Risk Stratification - 07/04/16 1030      Activity Barriers &  Cardiac Risk Stratification   Activity Barriers Left Knee Replacement;Right Knee Replacement;Deconditioning;Shortness of Breath;Neck/Spine Problems      6 Minute Walk:     6 Minute Walk    Row Name 07/10/16 1601 09/25/16 1417       6 Minute Walk   Phase Initial Mid Program    Distance 800 feet 1200 feet    Walk Time 6 minutes 6 minutes    # of Rest Breaks 0 0    MPH 1.5 2.27    METS 2.15 2.68    RPE 11 11    Perceived Dyspnea  0 1    Symptoms No No    Resting HR 81 bpm 97 bpm    Resting BP 140/82 164/70    Max Ex. HR 98 bpm 122 bpm    Max Ex. BP 164/80 190/90    2 Minute Post BP 140/84 150/90      Interval HR   Baseline HR 81 97    1 Minute HR 86 102    2 Minute HR 97 113    3 Minute HR 98 114    4 Minute HR 93 114    5 Minute HR 94 118    6 Minute HR 112 122    2 Minute Post HR 90 118    Interval Heart Rate? Yes Yes      Interval Oxygen   Interval Oxygen? Yes Yes    Baseline Oxygen Saturation % 92 % 91 %    Baseline Liters of Oxygen 0 L 0 L    1 Minute Oxygen Saturation % 85 % 85 %    1 Minute Liters of Oxygen 0 L 0 L    2 Minute Oxygen Saturation % 94 % 91 %    2 Minute Liters of Oxygen 2 L 0 L    3 Minute Oxygen Saturation % 90 % 92 %    3 Minute Liters of Oxygen 2 L 0 L    4 Minute Oxygen Saturation % 93 % 90 %    4 Minute Liters of Oxygen 2 L 0 L    5 Minute Oxygen Saturation % 92 % 90 %    5 Minute Liters of Oxygen 2 L 0 L    6 Minute Oxygen Saturation % 96 % 90 %    6 Minute Liters of Oxygen 2 L 0 L    2 Minute Post Oxygen Saturation % 98 % 94 %    2 Minute Post Liters of Oxygen 2 L  -       Initial Exercise Prescription:     Initial Exercise Prescription - 07/10/16 1600      Date of Initial Exercise RX and Referring Provider   Date 07/10/16   Referring Provider Dr.Yacoub  Clance ([place])     Oxygen   Oxygen Continuous   Liters 2  Bike   Level 0.5   Minutes 17     NuStep   Level 2   Minutes 17   METs 1.5     Track   Laps 5    Minutes 17     Prescription Details   Frequency (times per week) 2   Duration Progress to 45 minutes of aerobic exercise without signs/symptoms of physical distress     Intensity   THRR 40-80% of Max Heartrate 58-116   Ratings of Perceived Exertion 11-13   Perceived Dyspnea 0-4     Progression   Progression Continue progressive overload as per policy without signs/symptoms or physical distress.     Resistance Training   Training Prescription Yes   Weight GREEN BAND   Reps 10-12      Perform Capillary Blood Glucose checks as needed.  Exercise Prescription Changes:     Exercise Prescription Changes    Row Name 07/31/16 1200 08/05/16 1200 08/07/16 1313 08/14/16 1300 08/19/16 1200     Exercise Review   Progression  -  -  -  - Yes     Response to Exercise   Blood Pressure (Admit) 168/84 150/76 150/76 162/80 150/60   Blood Pressure (Exercise) 154/80 154/76 200/84  reck 190/93 186/80  left 181/91 right 190/93 180/90 manual 180/80   Blood Pressure (Exit) 142/66 144/72 152/86 170/88 142/62   Heart Rate (Admit) 88 bpm 80 bpm 81 bpm 89 bpm 85 bpm   Heart Rate (Exercise) 106 bpm 103 bpm 116 bpm 112 bpm 130 bpm   Heart Rate (Exit) 85 bpm 88 bpm 88 bpm 91 bpm 105 bpm   Oxygen Saturation (Admit) 96 % 98 % 94 % 89 % 94 %   Oxygen Saturation (Exercise) 93 % 95 % 95 % 112 % 94 %   Oxygen Saturation (Exit) 97 % 93 % 94 % 91 % 95 %   Rating of Perceived Exertion (Exercise) _0 Perceived Dyspnea (Exercise) 1 0 _1 Duration Progress to 45 minutes of aerobic exercise without signs/symptoms of physical distress Progress to 45 minutes of aerobic exercise without signs/symptoms of physical distress Progress to 45 minutes of aerobic exercise without signs/symptoms of physical distress Progress to 45 minutes of aerobic exercise without signs/symptoms of physical distress Progress to 45 minutes of aerobic exercise without signs/symptoms of physical distress   Intensity -  40-80% of  HRR THRR unchanged THRR unchanged THRR unchanged THRR unchanged     Resistance Training   Training Prescription _2    Weight _3 s   Reps 10-12  10 minutes of strength training 10-12  10 minutes of strength training 10-12  10 minutes of strength training 10-12  10 minutes of strength training 10-12     Interval Training   Interval Training _4      Oxygen   Oxygen _5    Liters  - _6 Bike   Level 0.5 1  -  - 1   Minutes 17 17  -  - 17     NuStep   Level 2 3  - 3 4   Minutes 17 17  - 17 17   METs 1.9 2.5  - 1.9 2.8     Track   Laps  - -  met with nutritionist during this station 13  11 12   Minutes  - _0 Row Name 08/21/16 1300 08/26/16 1200 08/28/16 1200 09/02/16 1100 09/09/16 1200     Exercise Review   Progression  - Yes  -  -  -     Response to Exercise   Blood Pressure (Admit) 154/60 154/64 160/70 150/74 154/80   Blood Pressure (Exercise) 160/76 166/84 220/90 166/80 184/90   Blood Pressure (Exit) 138/72 142/62 136/74 146/76 138/64   Heart Rate (Admit) 80 bpm 80 bpm 98 bpm 106 bpm 99 bpm   Heart Rate (Exercise) 122 bpm 122 bpm 111 bpm 113 bpm 117 bpm   Heart Rate (Exit) 100 bpm 100 bpm 93 bpm 105 bpm 106 bpm   Oxygen Saturation (Admit) 95 % 92 % 91 % 97 % 92 %   Oxygen Saturation (Exercise) 89 % 92 % 96 % 94 % 95 %   Oxygen Saturation (Exit) 95 % 94 % 95 % 94 % 93 %   Rating of Perceived Exertion (Exercise) _1 Perceived Dyspnea (Exercise) _2 0 0   Duration Progress to 45 minutes of aerobic exercise without signs/symptoms of physical distress Progress to 45 minutes of aerobic exercise without signs/symptoms of physical distress Progress to 45 minutes of aerobic exercise without signs/symptoms of physical distress Progress to 45 minutes of aerobic exercise without signs/symptoms of physical distress Progress to  45 minutes of aerobic exercise without signs/symptoms of physical distress   Intensity _3      Resistance Training   Training Prescription _4    Weight _5    Reps 10-12 10-12  10 minutes of sttength training 10-12  10 minutes of sttength training 10-12  10 minutes of sttength training 10-12  10 minutes of strength training     Interval Training   Interval Training _6      Oxygen   Oxygen _7    Liters _8 Bike   Level  - _9 Minutes  - _10 NuStep   Level _11 Minutes _12 METs 3.5 3.3 2.5 1.9 2.7     Track   Laps 14 16  - 14 13   Minutes 17 17  - 17 17   Row Name 09/11/16 1200 09/16/16 1200 09/18/16 1200 09/23/16 1200 09/25/16 1200     Exercise Review   Progression  - Yes  -  -  -     Response to Exercise   Blood Pressure (Admit) 160/80 140/60 150/70 164/70 168/80   Blood Pressure (Exercise) 170/90 180/84 186/84 154/70 168/70   Blood Pressure (Exit) 144/70 132/70 152/80 148/72 142/70   Heart Rate (Admit) 94 bpm 92 bpm 92 bpm 97 bpm 93 bpm   Heart Rate (Exercise) 120 bpm 128 bpm 115 bpm 123 bpm 130 bpm   Heart Rate (Exit) 102 bpm 104 bpm 95 bpm 106 bpm 104 bpm   Oxygen Saturation (Admit) 95 % 96 % 94 % 91 % 94 %   Oxygen Saturation (Exercise) 92 % 93 % 94 % 92 % 90 %   Oxygen Saturation (Exit) 92 % 93 % 94 % 92 % 95 %  Rating of Perceived Exertion (Exercise) '11 11 11 11 11   '$ Perceived Dyspnea (Exercise) '1 2 2 2 3   '$ Duration Progress to 45 minutes of aerobic exercise without signs/symptoms of physical distress Progress to 45 minutes of aerobic exercise without signs/symptoms of physical distress Progress to 45 minutes of aerobic exercise without signs/symptoms of physical distress Progress to 45 minutes of aerobic exercise  without signs/symptoms of physical distress Progress to 45 minutes of aerobic exercise without signs/symptoms of physical distress   Intensity THRR unchanged THRR unchanged THRR unchanged THRR unchanged THRR unchanged     Progression   Progression Continue to progress workloads to maintain intensity without signs/symptoms of physical distress. Continue to progress workloads to maintain intensity without signs/symptoms of physical distress. Continue to progress workloads to maintain intensity without signs/symptoms of physical distress. Continue to progress workloads to maintain intensity without signs/symptoms of physical distress. Continue to progress workloads to maintain intensity without signs/symptoms of physical distress.     Resistance Training   Training Prescription Yes Yes Yes Yes Yes   Weight green bands green bands green bands green bands green bands   Reps 10-12  10 minutes of strength training 10-12  10 minutes of strength training 10-12  10 minutes of strength training 10-12  10 minutes of strength training 10-12  10 minutes of strength training     Interval Training   Interval Training No No No No No     Oxygen   Oxygen Continuous Continuous Continuous Continuous -  Exercised on room air today tolerated well   Liters '2 2 2 2  '$ -     Bike   Level '1 1 1 1  '$ -   Minutes '17 17 17 17  '$ -     NuStep   Level  - 6  - 6 6   Minutes  - 17  - 17 17   METs  - 3.4  - 3.2 3.5     Track   Laps '16 16 12  '$ - 16   Minutes '17 17 17 17 17   '$ Row Name 09/30/16 1200 10/02/16 1200 10/07/16 1200 10/14/16 1200 11/04/16 1200     Exercise Review   Progression  -  -  - Yes Yes     Response to Exercise   Blood Pressure (Admit) 146/72 166/84 156/70 154/72 140/60   Blood Pressure (Exercise) 182/78 180/80 172/90 170/80 150/70   Blood Pressure (Exit) 152/82 164/82 140/80 122/60 122/68   Heart Rate (Admit) 92 bpm 102 bpm 105 bpm 83 bpm 103 bpm   Heart Rate (Exercise) 145 bpm 120 bpm 129 bpm  129 bpm 128 bpm   Heart Rate (Exit) 101 bpm 105 bpm 105 bpm 106 bpm 105 bpm   Oxygen Saturation (Admit) 94 % 95 % 95 % 95 % 95 %   Oxygen Saturation (Exercise) 90 % 93 % 90 % 91 % 89 %   Oxygen Saturation (Exit) 93 % 90 % 93 % 93 % 91 %   Rating of Perceived Exertion (Exercise) '12 13 12 13 13   '$ Perceived Dyspnea (Exercise) '1 1 2 2 1   '$ Duration Progress to 45 minutes of aerobic exercise without signs/symptoms of physical distress Progress to 45 minutes of aerobic exercise without signs/symptoms of physical distress Progress to 45 minutes of aerobic exercise without signs/symptoms of physical distress Progress to 45 minutes of aerobic exercise without signs/symptoms of physical distress Progress to 45 minutes of aerobic exercise without signs/symptoms of physical distress  Intensity THRR unchanged THRR unchanged THRR unchanged THRR unchanged THRR unchanged     Progression   Progression Continue to progress workloads to maintain intensity without signs/symptoms of physical distress. Continue to progress workloads to maintain intensity without signs/symptoms of physical distress. Continue to progress workloads to maintain intensity without signs/symptoms of physical distress. Continue to progress workloads to maintain intensity without signs/symptoms of physical distress. Continue to progress workloads to maintain intensity without signs/symptoms of physical distress.     Resistance Training   Training Prescription Yes Yes Yes Yes Yes   Weight green bands green bands green bands green bands green bands   Reps 10-12  10 minutes of strength training 10-12  10 minutes of strength training 10-12  10 minutes of strength training 10-12  10 minutes of strength training 10-12  10 minutes of strength training     Interval Training   Interval Training No No No No No     Oxygen   Oxygen -  Exercised on room air today tolerated well -  Exercised on room air today tolerated well Continuous Continuous  Continuous   Liters  -  - '2 2 2     '$ Bike   Level '1 1 1 1 1   '$ Minutes '17 17 17 17 17     '$ NuStep   Level '6 6 6 7 7   '$ Minutes '17 17 17 17 17   '$ METs 3.1 3.4 2.4 4 3.4     Track   Laps 19  - '18 19 16   '$ Minutes 17  - '17 17 17   '$ Row Name 11/06/16 1200 11/11/16 1212 11/13/16 1253 11/18/16 1229 11/20/16 1200     Exercise Review   Progression  -  -  - Yes  -     Response to Exercise   Blood Pressure (Admit) 148/72 146/66 140/82 120/60 140/70   Blood Pressure (Exercise) 184/80 160/80 164/70 180/90 140/72   Blood Pressure (Exit) 138/74 104/60 132/62 130/70 118/70   Heart Rate (Admit) 84 bpm 84 bpm 74 bpm 92 bpm 72 bpm   Heart Rate (Exercise) 128 bpm 116 bpm 122 bpm 142 bpm 132 bpm   Heart Rate (Exit) 103 bpm 104 bpm 100 bpm 110 bpm 103 bpm   Oxygen Saturation (Admit) 92 % 95 % 92 % 94 % 96 %   Oxygen Saturation (Exercise) 88 % 2 % 90 % 90 % 91 %   Oxygen Saturation (Exit) 92 % 91 % 92 % 94 % 93 %   Rating of Perceived Exertion (Exercise) '13 13 11 13 13   '$ Perceived Dyspnea (Exercise) '3 2 1 2 1   '$ Duration Progress to 45 minutes of aerobic exercise without signs/symptoms of physical distress Progress to 45 minutes of aerobic exercise without signs/symptoms of physical distress Progress to 45 minutes of aerobic exercise without signs/symptoms of physical distress Progress to 45 minutes of aerobic exercise without signs/symptoms of physical distress Progress to 45 minutes of aerobic exercise without signs/symptoms of physical distress   Intensity THRR unchanged THRR unchanged THRR unchanged THRR unchanged THRR unchanged     Progression   Progression Continue to progress workloads to maintain intensity without signs/symptoms of physical distress. Continue to progress workloads to maintain intensity without signs/symptoms of physical distress. Continue to progress workloads to maintain intensity without signs/symptoms of physical distress. Continue to progress workloads to maintain intensity without  signs/symptoms of physical distress. Continue to progress workloads to maintain intensity without signs/symptoms of physical distress.  Resistance Training   Training Prescription Yes Yes Yes Yes Yes   Weight green bands green bands green bands green bands green bands   Reps 10-12  10 minutes of strength training 10-12  10 minutes of strength training 10-12  10 minutes of strength training 10-12  10 minutes of strength training 10-12  10 minutes of strength training     Interval Training   Interval Training No No No No No     Oxygen   Oxygen Continuous Continuous Continuous Continuous Continuous   Liters '2 2 2 2 2     '$ Bike   Level  - 1 1 1.4  -   Minutes  - '17 17 17  '$ -     NuStep   Level '7 7 7 8 8   '$ Minutes '17 17 17 17 17   '$ METs 3.7 3.7 3.5 3.8 4.1     Track   Laps 20 17  - 16 16   Minutes 17 17  - 17 17      Exercise Comments:     Exercise Comments    Row Name 08/18/16 1627 09/15/16 0956 09/30/16 0848 11/03/16 1636 11/24/16 1302   Exercise Comments Patient's blood pressure have been out of control. Has only attended four entire sessions. Blood pressure has been addressed by the doctor and medicines have been adjusted. Will cont. to monitor.  Patient's blood pressure is still out of control--Dr. has readjusted his medicine again. Will continue to monitor. Patient is improving slowly. Will discuss home exercise in the next week or two depending on BP, Patient's blood pressure is still running high. Discussed home exercise with patient. Patient went out and bought a pulse oximeter.  Patient is progressing well. He is walking up to 19 laps in 15 minutes. He is no longer on oxygen. MET level places him at a moderate workload. Mr. Hoogland has been pushing himself more in his exercise. He states that he is happy to see what he can do -- states he feels good. Blood pressure can still be high at times (184/80). Monitoring closely.       Discharge Exercise Prescription (Final  Exercise Prescription Changes):     Exercise Prescription Changes - 11/20/16 1200      Response to Exercise   Blood Pressure (Admit) 140/70   Blood Pressure (Exercise) 140/72   Blood Pressure (Exit) 118/70   Heart Rate (Admit) 72 bpm   Heart Rate (Exercise) 132 bpm   Heart Rate (Exit) 103 bpm   Oxygen Saturation (Admit) 96 %   Oxygen Saturation (Exercise) 91 %   Oxygen Saturation (Exit) 93 %   Rating of Perceived Exertion (Exercise) 13   Perceived Dyspnea (Exercise) 1   Duration Progress to 45 minutes of aerobic exercise without signs/symptoms of physical distress   Intensity THRR unchanged     Progression   Progression Continue to progress workloads to maintain intensity without signs/symptoms of physical distress.     Resistance Training   Training Prescription Yes   Weight green bands   Reps 10-12  10 minutes of strength training     Interval Training   Interval Training No     Oxygen   Oxygen Continuous   Liters 2     NuStep   Level 8   Minutes 17   METs 4.1     Track   Laps 16   Minutes 17       Nutrition:  Target Goals: Understanding of nutrition guidelines,  daily intake of sodium '1500mg'$ , cholesterol '200mg'$ , calories 30% from fat and 7% or less from saturated fats, daily to have 5 or more servings of fruits and vegetables.  Biometrics:     Pre Biometrics - 07/10/16 1619      Pre Biometrics   Grip Strength 36 kg       Nutrition Therapy Plan and Nutrition Goals:     Nutrition Therapy & Goals - 08/05/16 1231      Nutrition Therapy   Diet General, Healthful      Personal Nutrition Goals   Personal Goal #1 1-2 lb wt loss/week to a wt loss goal of 6-24 lb at graduation from [place], educate and counsel regarding individualized specific dietary modifications aiming towards targeted core components such as weight, hypertension, lipid management, diabetes, heart failure and other  comorbidities.   Expected Outcomes Short Term Goal: Understand basic principles of dietary content, such as calories, fat, sodium, cholesterol and nutrients.;Long Term Goal: Adherence to prescribed nutrition plan.      Nutrition Discharge: Rate Your Plate Scores:     Nutrition Assessments - 08/05/16 1231      Rate Your Plate Scores   Pre Score 54      Psychosocial: Target Goals: Acknowledge presence or absence of depression, maximize coping skills, provide positive support system. Participant is able to verbalize types and ability to use techniques and skills needed for reducing stress and depression.  Initial Review & Psychosocial Screening:     Initial Psych Review & Screening - 07/04/16 1036      Initial Review   Current issues with History of Depression     Family Dynamics   Good Support System? Yes     Barriers   Psychosocial barriers to participate in program Psychosocial barriers identified (see note)     Screening Interventions   Interventions Encouraged to exercise      Quality of Life Scores:   PHQ-9: Recent Review Flowsheet Data    Depression screen [facility] 2/9 07/04/2016   Decreased Interest 0   Down, Depressed, Hopeless 0   PHQ - 2 Score 0      Psychosocial Evaluation and Intervention:     Psychosocial Evaluation - 07/04/16 1145      Psychosocial Evaluation & Interventions   Interventions Encouraged to exercise with the program and follow exercise prescription      Psychosocial Re-Evaluation:     Psychosocial Re-Evaluation    [place] Name 08/19/16 0650 09/16/16 0713 10/07/16 0822 11/04/16 0954 11/24/16 1612     Psychosocial Re-Evaluation   Interventions Encouraged to attend Pulmonary Rehabilitation for the exercise Encouraged to attend Pulmonary Rehabilitation for the exercise Encouraged to attend Pulmonary Rehabilitation for the exercise Encouraged to attend Pulmonary Rehabilitation for the exercise Encouraged to attend Pulmonary Rehabilitation  for the exercise   Comments see comments on ITP see comments on ITP no psychosocial barriers identified during the past 30 days no psychosocial barriers identified during the past 30 days no psychosocial barriers identified during the past 30 days     Education: Education Goals: Education classes will be provided on a weekly basis, covering required topics. Participant will state understanding/return demonstration of topics presented.  Learning Barriers/Preferences:     Learning Barriers/Preferences - 07/04/16 1030      Learning Barriers/Preferences   Learning Barriers None   Learning Preferences Computer/Internet;Group Instruction;Individual Instruction;Skilled Demonstration;Verbal Instruction;Written Material;Video      Education Topics: Risk Factor Reduction:  -  Group instruction that is supported by a PowerPoint presentation. Instructor discusses the definition of a risk factor, different risk factors for pulmonary disease, and how the heart and lungs work together.     Nutrition for Pulmonary Patient:  -Group instruction provided by PowerPoint slides, verbal discussion, and written materials to support subject matter. The instructor gives an explanation and review of healthy diet recommendations, which includes a discussion on weight management, recommendations for fruit and vegetable consumption, as well as protein, fluid, caffeine, fiber, sodium, sugar, and alcohol. Tips for eating when patients are short of breath are discussed. Flowsheet Row PULMONARY REHAB OTHER RESPIRATORY from 11/20/2016 in [facility] CARDIAC REHAB  Date  08/21/16 [facility] [employer] [facility]]  Educator  RD  Instruction Review Code  2- meets goals/outcomes      Pursed Lip Breathing:  -Group instruction that is supported by demonstration and informational handouts. Instructor discusses the benefits of pursed lip and diaphragmatic breathing and detailed demonstration on how to preform  both.   Flowsheet Row PULMONARY REHAB OTHER RESPIRATORY from 11/20/2016 in [facility] CARDIAC REHAB  Date  10/02/16  Educator  ep  Instruction Review Code  2- meets goals/outcomes      Oxygen Safety:  -Group instruction provided by PowerPoint, verbal discussion, and written material to support subject matter. There is an overview of "What is Oxygen" and "Why do we need it".  Instructor also reviews how to create a safe environment for oxygen use, the importance of using oxygen as prescribed, and the risks of noncompliance. There is a brief discussion on traveling with oxygen and resources the patient may utilize. Flowsheet Row PULMONARY REHAB OTHER RESPIRATORY from 11/20/2016 in [facility] CARDIAC REHAB  Date  08/14/16  Educator  RN  Instruction Review Code  2- meets goals/outcomes      Oxygen Equipment:  -Group instruction provided by [facility] Staff utilizing handouts, written materials, and equipment demonstrations. Flowsheet Row PULMONARY REHAB OTHER RESPIRATORY from 11/20/2016 in [facility] CARDIAC REHAB  Date  08/28/16  Educator  Rep  Instruction Review Code  2- meets goals/outcomes      Signs and Symptoms:  -Group instruction provided by written material and verbal discussion to support subject matter. Warning signs and symptoms of infection, stroke, and heart attack are reviewed and when to call the physician/911 reinforced. Tips for preventing the spread of infection discussed. Flowsheet Row PULMONARY REHAB OTHER RESPIRATORY from 11/20/2016 in [facility] CARDIAC REHAB  Date  11/13/16  Educator  RN  Instruction Review Code  R- Review/reinforce      Advanced Directives:  -Group instruction provided by verbal instruction and written material to support subject matter. Instructor reviews Advanced Directive laws and proper instruction for filling out document.   Pulmonary Video:  -Group video education that  reviews the importance of medication and oxygen compliance, exercise, good nutrition, pulmonary hygiene, and pursed lip and diaphragmatic breathing for the pulmonary patient. Flowsheet Row PULMONARY REHAB OTHER RESPIRATORY from 11/20/2016 in [facility] CARDIAC REHAB  Date  09/11/16  Educator  video  Instruction Review Code  2- meets goals/outcomes      Exercise for the Pulmonary Patient:  -Group instruction that is supported by a PowerPoint presentation. Instructor discusses benefits of exercise, core components of exercise, frequency, duration, and intensity of an exercise routine, importance of utilizing pulse oximetry during exercise, safety while exercising, and options of places to exercise outside of rehab.  Pulmonary Medications:  -Verbally interactive group education provided by instructor with focus on inhaled medications and proper administration. Flowsheet Row PULMONARY REHAB OTHER RESPIRATORY from 11/20/2016 in [place]  Date  11/06/16  Educator  Pharm D  Instruction Review Code  2- meets goals/outcomes      Anatomy and Physiology of the Respiratory System and Intimacy:  -Group instruction provided by PowerPoint, verbal discussion, and written material to support subject matter. Instructor reviews respiratory cycle and anatomical components of the respiratory system and their functions. Instructor also reviews differences in obstructive and restrictive respiratory diseases with examples of each. Intimacy, Sex, and Sexuality differences are reviewed with a discussion on how relationships can change when diagnosed with pulmonary disease. Common sexual concerns are reviewed. Flowsheet Row PULMONARY REHAB OTHER RESPIRATORY from 11/20/2016 in [place]  Date  09/25/16  Educator  RN  Instruction Review Code  2- meets goals/outcomes      Knowledge Questionnaire Score:   Core Components/Risk  Factors/Patient Goals at Admission:     Personal Goals and Risk Factors at Admission - 07/04/16 1142      Core Components/Risk Factors/Patient Goals on Admission   Stress --  Patient feels he is not stressed however he verbalizes multiple times how hard it is to raise a teenage boy at his age.      Core Components/Risk Factors/Patient Goals Review:      Goals and Risk Factor Review    Row Name 08/19/16 0649 09/16/16 0713 10/07/16 0821 11/04/16 0954 11/24/16 1612     Core Components/Risk Factors/Patient Goals Review   Personal Goals Review Weight Management/Obesity;Increase Strength and Stamina;Increase knowledge of respiratory medications and ability to use respiratory devices properly.;Develop more efficient breathing techniques such as purse lipped breathing and diaphragmatic breathing and practicing self-pacing with activity.;Stress Weight Management/Obesity;Increase Strength and Stamina;Increase knowledge of respiratory medications and ability to use respiratory devices properly.;Develop more efficient breathing techniques such as purse lipped breathing and diaphragmatic breathing and practicing self-pacing with activity.;Stress Weight Management/Obesity;Increase Strength and Stamina;Increase knowledge of respiratory medications and ability to use respiratory devices properly.;Develop more efficient breathing techniques such as purse lipped breathing and diaphragmatic breathing and practicing self-pacing with activity.;Stress Weight Management/Obesity;Increase Strength and Stamina;Increase knowledge of respiratory medications and ability to use respiratory devices properly.;Develop more efficient breathing techniques such as purse lipped breathing and diaphragmatic breathing and practicing self-pacing with activity.;Stress Weight Management/Obesity;Increase Strength and Stamina;Increase knowledge of respiratory medications and ability to use respiratory devices properly.;Develop more efficient  breathing techniques such as purse lipped breathing and diaphragmatic breathing and practicing self-pacing with activity.;Stress   Review see comments section on ITP see comments section on ITP see comments section on ITP see comments section on ITP see comments section on ITP   Expected Outcomes see admission expected outcomes see admission expected outcomes see admission expected outcomes see admission expected outcomes see admission expected outcomes      Core Components/Risk Factors/Patient Goals at Discharge (Final Review):      Goals and Risk Factor Review - 11/24/16 1612      Core Components/Risk Factors/Patient Goals Review   Personal Goals Review Weight Management/Obesity;Increase Strength and Stamina;Increase knowledge of respiratory medications and ability to use respiratory devices properly.;Develop more efficient breathing techniques such as purse lipped breathing and diaphragmatic breathing and practicing self-pacing with activity.;Stress   Review see comments section on ITP   Expected Outcomes see admission expected outcomes      ITP Comments:   Comments:  ITP REVIEW Pt is making expected progress toward most pulmonary rehab goals after completing 27 sessions. He continues to struggle with his weight. He has not gained weight, but he has not lost as he has wished. He has met with the RD and knows the dietary modifications needed for weight loss. He states now that his stamina and strength has improved his stress level has decreased. Recommend continued exercise, life style modification, education, and utilization of breathing techniques to increase stamina and strength and decrease shortness of breath with exertion.

## 2016-11-27 ENCOUNTER — Encounter (HOSPITAL_COMMUNITY)
Admission: RE | Admit: 2016-11-27 | Discharge: 2016-11-27 | Disposition: A | Discharge status: Home / Self Care | Payer: Non-veteran care | Source: Ambulatory Visit | Attending: Pulmonary Disease | Admitting: Pulmonary Disease

## 2016-11-27 DIAGNOSIS — J438 Other emphysema: Secondary | ICD-10-CM

## 2016-11-27 NOTE — Progress Notes (Signed)
Daily Session Note  Patient Details  Name: Mark Robertson MRN: [id number] Date of Birth: [birth year] Referring Provider:   [name] Pulmonary Rehab Walk Test from 07/10/2016 in [place]  Referring Provider  [name] ([place])]      Encounter Date: 11/27/2016  Check In:     Session Check In - 11/27/16 1030      Check-In   Location [facility]   Staff Present Rosebud Poles, RN, BSN;Finnbar Cedillos, MS, ACSM RCEP, Exercise Physiologist;Lisa Ysidro Evert, RN;Portia Rollene Rotunda, RN, BSN   Supervising physician immediately available to respond to emergencies [facility] immediately available   Physician(s) Dr. Algis Liming   Medication changes reported     No   Fall or balance concerns reported    No   Warm-up and Cool-down Performed as group-led instruction   Resistance Training Performed Yes   VAD Patient? No     Pain Assessment   Currently in Pain? No/denies   Multiple Pain Sites No      Capillary Blood Glucose: No results found for this or any previous visit (from the past 24 hour(s)).      Exercise Prescription Changes - 11/27/16 1200      Response to Exercise   Blood Pressure (Admit) 142/68   Blood Pressure (Exercise) 160/70   Blood Pressure (Exit) 150/82   Heart Rate (Admit) 81 bpm   Heart Rate (Exercise) 120 bpm   Heart Rate (Exit) 105 bpm   Oxygen Saturation (Admit) 95 %   Oxygen Saturation (Exercise) 89 %   Oxygen Saturation (Exit) 96 %   Rating of Perceived Exertion (Exercise) 13   Perceived Dyspnea (Exercise) 1   Duration Progress to 45 minutes of aerobic exercise without signs/symptoms of physical distress   Intensity THRR unchanged     Progression   Progression Continue to progress workloads to maintain intensity without signs/symptoms of physical distress.     Resistance Training   Training Prescription Yes   Weight green bands   Reps 10-12  10 minutes of strength training     Interval Training    Interval Training No     Oxygen   Oxygen Continuous   Liters 2     Bike   Level 1.4   Minutes 17     Track   Laps 17   Minutes 17     Goals Met:  Exercise tolerated well No report of cardiac concerns or symptoms Strength training completed today  Goals Unmet:  Not Applicable  Comments: Service time is from 10:30am to 12:00p    Dr. Rush Farmer is Medical Director for Pulmonary Rehab at [facility].

## 2016-12-02 ENCOUNTER — Encounter (HOSPITAL_COMMUNITY)
Admission: RE | Admit: 2016-12-02 | Discharge: 2016-12-02 | Disposition: A | Discharge status: Home / Self Care | Payer: Non-veteran care | Source: Ambulatory Visit | Attending: Pulmonary Disease | Admitting: Pulmonary Disease

## 2016-12-02 VITALS — Wt 235.2 lb

## 2016-12-02 DIAGNOSIS — J438 Other emphysema: Secondary | ICD-10-CM

## 2016-12-02 NOTE — Progress Notes (Signed)
Daily Session Note  Patient Details  Name: Mark Robertson MRN: [id number] Date of Birth: 08/24/1941 Referring Provider:   Flowsheet Row Pulmonary Rehab Walk Test from 07/10/2016 in [facility] CARDIAC REHAB  Referring Provider  Dr.Yacoub [[place] ([place]      Encounter Date: 12/02/2016  Check In:     Session Check In - 12/02/16 1030      Check-In   Location [facility]   Staff Present Joan Behrens, RN, BSN;Molly diVincenzo, MS, ACSM RCEP, Exercise Physiologist;Lisa Hughes, RN;Portia Payne, RN, BSN   Supervising physician immediately available to respond to emergencies [facility] immediately available   Physician(s) Dr. Singh   Medication changes reported     No   Fall or balance concerns reported    No   Warm-up and Cool-down Performed as group-led instruction   Resistance Training Performed Yes   VAD Patient? No     Pain Assessment   Currently in Pain? No/denies   Multiple Pain Sites No      Capillary Blood Glucose: No results found for this or any previous visit (from the past 24 hour(s)).      Exercise Prescription Changes - 12/02/16 1210      Response to Exercise   Blood Pressure (Admit) 144/68   Blood Pressure (Exercise) 174/84   Blood Pressure (Exit) 118/68   Heart Rate (Admit) 84 bpm   Heart Rate (Exercise) 133 bpm   Heart Rate (Exit) 111 bpm   Oxygen Saturation (Admit) 97 %   Oxygen Saturation (Exercise) 90 %   Oxygen Saturation (Exit) 111 %   Rating of Perceived Exertion (Exercise) 13   Perceived Dyspnea (Exercise) 1   Duration Progress to 45 minutes of aerobic exercise without signs/symptoms of physical distress   Intensity THRR unchanged     Progression   Progression Continue to progress workloads to maintain intensity without signs/symptoms of physical distress.     Resistance Training   Training Prescription Yes   Weight green bands   Reps 10-12  10 minutes of strength training     Interval Training   Interval Training No     Oxygen   Oxygen Continuous   Liters 2     Bike   Level 1.4   Minutes 17     NuStep   Level 8   Minutes 17   METs 4.1     Track   Laps 20   Minutes 17     Goals Met:  Independence with exercise equipment Improved SOB with ADL's Using PLB without cueing & demonstrates good technique Exercise tolerated well No report of cardiac concerns or symptoms Strength training completed today  Goals Unmet:  Not Applicable  Comments: Service time is from 1030 to 1205   Dr. Wesam G. Yacoub is Medical Director for Pulmonary Rehab at [facility]. 

## 2016-12-04 ENCOUNTER — Encounter (HOSPITAL_COMMUNITY)
Admission: RE | Admit: 2016-12-04 | Discharge: 2016-12-04 | Disposition: A | Discharge status: Home / Self Care | Payer: Non-veteran care | Source: Ambulatory Visit | Attending: Pulmonary Disease | Admitting: Pulmonary Disease

## 2016-12-04 VITALS — Wt 237.7 lb

## 2016-12-04 DIAGNOSIS — J438 Other emphysema: Secondary | ICD-10-CM | POA: Diagnosis not present

## 2016-12-04 NOTE — Progress Notes (Signed)
Daily Session Note  Patient Details  Name: Mark Robertson MRN: [id number] Date of Birth: 1941/07/15 Referring Provider:   [name] Pulmonary Rehab Walk Test from 07/10/2016 in [place]  Referring Provider  [name] ([place])]      Encounter Date: 12/04/2016  Check In:     Session Check In - 12/04/16 1235      Check-In   Location [facility]   Staff Present Su Hilt, MS, ACSM RCEP, Exercise Physiologist;Portia Rollene Rotunda, RN, Roque Cash, RN   Supervising physician immediately available to respond to emergencies [facility] immediately available   Physician(s) Dr. Tana Coast   Medication changes reported     No   Fall or balance concerns reported    No   Warm-up and Cool-down Performed as group-led instruction   Resistance Training Performed Yes   VAD Patient? No     Pain Assessment   Currently in Pain? No/denies   Multiple Pain Sites No      Capillary Blood Glucose: No results found for this or any previous visit (from the past 24 hour(s)).      Exercise Prescription Changes - 12/04/16 1200      Response to Exercise   Blood Pressure (Admit) 130/80   Blood Pressure (Exercise) 150/88   Blood Pressure (Exit) 132/62   Heart Rate (Admit) 73 bpm   Heart Rate (Exercise) 123 bpm   Heart Rate (Exit) 101 bpm   Oxygen Saturation (Admit) 95 %   Oxygen Saturation (Exercise) 88 %   Oxygen Saturation (Exit) 94 %   Rating of Perceived Exertion (Exercise) 13   Perceived Dyspnea (Exercise) 3   Duration Progress to 45 minutes of aerobic exercise without signs/symptoms of physical distress   Intensity THRR unchanged     Progression   Progression Continue to progress workloads to maintain intensity without signs/symptoms of physical distress.     Resistance Training   Training Prescription Yes   Weight green bands   Reps 10-12  10 minutes of strength training     Interval Training   Interval Training No      Oxygen   Oxygen Continuous   Liters 2     NuStep   Level 8   Minutes 17   METs 4.1     Track   Laps 18   Minutes 17     Goals Met:  Exercise tolerated well No report of cardiac concerns or symptoms Strength training completed today  Goals Unmet:  Not Applicable  Comments: Service time is from 10:30a to 12:15p    Dr. Rush Farmer is Medical Director for Pulmonary Rehab at [facility].

## 2016-12-09 ENCOUNTER — Encounter (HOSPITAL_COMMUNITY)
Admission: RE | Admit: 2016-12-09 | Discharge: 2016-12-09 | Disposition: A | Discharge status: Home / Self Care | Payer: Non-veteran care | Source: Ambulatory Visit | Attending: Pulmonary Disease | Admitting: Pulmonary Disease

## 2016-12-09 VITALS — Wt 238.1 lb

## 2016-12-09 DIAGNOSIS — J438 Other emphysema: Secondary | ICD-10-CM

## 2016-12-09 NOTE — Progress Notes (Signed)
Daily Session Note  Patient Details  Name: Mark Robertson MRN: [id number] Date of Birth: [birth year] Referring Provider:   [name] Pulmonary Rehab Walk Test from 07/10/2016 in [place]  Referring Provider  [name] ([place])]      Encounter Date: 12/09/2016  Check In:     Session Check In - 12/09/16 1030      Check-In   Location [facility]   Staff Present Rosebud Poles, RN, BSN;Molly diVincenzo, MS, ACSM RCEP, Exercise Physiologist;Lisa Ysidro Evert, RN;Dorethy Tomey Rollene Rotunda, RN, BSN   Supervising physician immediately available to respond to emergencies [facility] immediately available   Physician(s) Dr. Posey Pronto   Medication changes reported     No   Fall or balance concerns reported    No   Tobacco Cessation No Change   Warm-up and Cool-down Performed as group-led instruction   Resistance Training Performed Yes   VAD Patient? No     Pain Assessment   Currently in Pain? No/denies   Multiple Pain Sites No      Capillary Blood Glucose: No results found for this or any previous visit (from the past 24 hour(s)).      Exercise Prescription Changes - 12/09/16 1209      Response to Exercise   Blood Pressure (Admit) 150/66   Blood Pressure (Exercise) 154/80   Blood Pressure (Exit) 138/78   Heart Rate (Admit) 83 bpm   Heart Rate (Exercise) 127 bpm   Heart Rate (Exit) 97 bpm   Oxygen Saturation (Admit) 96 %   Oxygen Saturation (Exercise) 89 %   Oxygen Saturation (Exit) 94 %   Rating of Perceived Exertion (Exercise) 11   Perceived Dyspnea (Exercise) 1   Duration Progress to 45 minutes of aerobic exercise without signs/symptoms of physical distress   Intensity THRR unchanged     Progression   Progression Continue to progress workloads to maintain intensity without signs/symptoms of physical distress.     Resistance Training   Training Prescription Yes   Weight green bands   Reps --  10 minutes of strength training      Interval Training   Interval Training No     Oxygen   Oxygen --  room air     Bike   Level 1.4   Minutes 17     NuStep   Level 8   Minutes 17   METs 4.2     Track   Laps 18   Minutes 17      History  Smoking Status  . Former Smoker  . Packs/day: 2.00  . Years: 50.00  . Types: Cigarettes  . Quit date: 10/13/2001  Smokeless Tobacco  . Not on file    Goals Met:  Independence with exercise equipment Improved SOB with ADL's Using PLB without cueing & demonstrates good technique Exercise tolerated well No report of cardiac concerns or symptoms Strength training completed today  Goals Unmet:  Not Applicable  Comments: Service time is from 1030 to 1205   Dr. Rush Farmer is Medical Director for Pulmonary Rehab at [facility].

## 2016-12-11 ENCOUNTER — Encounter (HOSPITAL_COMMUNITY)
Admission: RE | Admit: 2016-12-11 | Discharge: 2016-12-11 | Disposition: A | Discharge status: Home / Self Care | Payer: Medicare Other | Source: Ambulatory Visit | Attending: Pulmonary Disease | Admitting: Pulmonary Disease

## 2016-12-11 VITALS — Wt 235.9 lb

## 2016-12-11 DIAGNOSIS — J449 Chronic obstructive pulmonary disease, unspecified: Secondary | ICD-10-CM | POA: Diagnosis not present

## 2016-12-11 DIAGNOSIS — F329 Major depressive disorder, single episode, unspecified: Secondary | ICD-10-CM | POA: Diagnosis not present

## 2016-12-11 DIAGNOSIS — K449 Diaphragmatic hernia without obstruction or gangrene: Secondary | ICD-10-CM | POA: Diagnosis not present

## 2016-12-11 DIAGNOSIS — K224 Dyskinesia of esophagus: Secondary | ICD-10-CM | POA: Insufficient documentation

## 2016-12-11 DIAGNOSIS — I10 Essential (primary) hypertension: Secondary | ICD-10-CM | POA: Insufficient documentation

## 2016-12-11 DIAGNOSIS — E7800 Pure hypercholesterolemia, unspecified: Secondary | ICD-10-CM | POA: Diagnosis not present

## 2016-12-11 DIAGNOSIS — J438 Other emphysema: Secondary | ICD-10-CM

## 2016-12-11 NOTE — Progress Notes (Signed)
Daily Session Note  Patient Details  Name: Mark Robertson MRN: [id number] Date of Birth: Feb 27, 1941 Referring Provider:   [name] Pulmonary Rehab Walk Test from 07/10/2016 in [place]  Referring Provider  [name] ([place])]      Encounter Date: 12/11/2016  Check In:     Session Check In - 12/11/16 1030      Check-In   [facility]   Staff Present Rosebud Poles, RN, BSN;Molly diVincenzo, MS, ACSM RCEP, Exercise Physiologist;Lisa Ysidro Evert, RN;Portia Rollene Rotunda, RN, BSN   Supervising physician immediately available to respond to emergencies [facility] immediately available   Physician(s) Dr. Sloan Leiter   Medication changes reported     No   Fall or balance concerns reported    No   Tobacco Cessation No Change   Warm-up and Cool-down Performed as group-led instruction   Resistance Training Performed Yes   VAD Patient? No     Pain Assessment   Currently in Pain? No/denies   Multiple Pain Sites No      Capillary Blood Glucose: No results found for this or any previous visit (from the past 24 hour(s)).      Exercise Prescription Changes - 12/11/16 1200      Response to Exercise   Blood Pressure (Admit) 154/68   Blood Pressure (Exercise) 164/80   Blood Pressure (Exit) 134/70   Heart Rate (Admit) 87 bpm   Heart Rate (Exercise) 127 bpm   Heart Rate (Exit) 99 bpm   Oxygen Saturation (Admit) 94 %   Oxygen Saturation (Exercise) 88 %   Oxygen Saturation (Exit) 91 %   Rating of Perceived Exertion (Exercise) 12   Perceived Dyspnea (Exercise) 1   Duration Progress to 45 minutes of aerobic exercise without signs/symptoms of physical distress   Intensity THRR unchanged     Progression   Progression Continue to progress workloads to maintain intensity without signs/symptoms of physical distress.     Resistance Training   Training Prescription Yes   Weight green bands   Reps 10-15   Time 10 Minutes     Interval Training   Interval Training No     Bike   Level 1.2   Minutes 17     NuStep   Level 8   Minutes 17   METs 5.7      History  Smoking Status  . Former Smoker  . Packs/day: 2.00  . Years: 50.00  . Types: Cigarettes  . Quit date: 10/13/2001  Smokeless Tobacco  . Not on file    Goals Met:  Exercise tolerated well Strength training completed today  Goals Unmet:  Not Applicable  Comments: Service time is from 1030 to 1220   Dr. Rush Farmer is Medical Director for Pulmonary Rehab at [facility].

## 2016-12-16 ENCOUNTER — Encounter (HOSPITAL_COMMUNITY)
Admission: RE | Admit: 2016-12-16 | Discharge: 2016-12-16 | Disposition: A | Discharge status: Home / Self Care | Payer: Medicare Other | Source: Ambulatory Visit | Attending: Pulmonary Disease | Admitting: Pulmonary Disease

## 2016-12-16 DIAGNOSIS — J449 Chronic obstructive pulmonary disease, unspecified: Secondary | ICD-10-CM | POA: Diagnosis not present

## 2016-12-16 DIAGNOSIS — K449 Diaphragmatic hernia without obstruction or gangrene: Secondary | ICD-10-CM | POA: Diagnosis not present

## 2016-12-16 DIAGNOSIS — J438 Other emphysema: Secondary | ICD-10-CM | POA: Diagnosis not present

## 2016-12-16 DIAGNOSIS — K224 Dyskinesia of esophagus: Secondary | ICD-10-CM | POA: Diagnosis not present

## 2016-12-16 DIAGNOSIS — I10 Essential (primary) hypertension: Secondary | ICD-10-CM | POA: Diagnosis not present

## 2016-12-16 DIAGNOSIS — E7800 Pure hypercholesterolemia, unspecified: Secondary | ICD-10-CM | POA: Diagnosis not present

## 2016-12-17 ENCOUNTER — Encounter (HOSPITAL_COMMUNITY): Payer: Self-pay | Admitting: *Deleted

## 2016-12-23 ENCOUNTER — Encounter (HOSPITAL_COMMUNITY)
Admission: RE | Admit: 2016-12-23 | Discharge: 2016-12-23 | Disposition: A | Discharge status: Home / Self Care | Payer: Medicare Other | Source: Ambulatory Visit | Attending: Pulmonary Disease | Admitting: Pulmonary Disease

## 2016-12-25 ENCOUNTER — Encounter (HOSPITAL_COMMUNITY)
Admission: RE | Admit: 2016-12-25 | Discharge: 2016-12-25 | Disposition: A | Discharge status: Home / Self Care | Payer: Medicare Other | Source: Ambulatory Visit | Attending: Pulmonary Disease | Admitting: Pulmonary Disease

## 2016-12-25 NOTE — Progress Notes (Signed)
Mark Robertson started the Pulmonary Maintenance Program 12/22/16. He is tolerating exercise well without complaint.

## 2016-12-30 ENCOUNTER — Encounter (HOSPITAL_COMMUNITY)
Admission: RE | Admit: 2016-12-30 | Discharge: 2016-12-30 | Disposition: A | Discharge status: Home / Self Care | Payer: Medicare Other | Source: Ambulatory Visit | Attending: Pulmonary Disease | Admitting: Pulmonary Disease

## 2017-01-01 ENCOUNTER — Encounter (HOSPITAL_COMMUNITY): Payer: Medicare Other

## 2017-01-01 DIAGNOSIS — Z7982 Long term (current) use of aspirin: Secondary | ICD-10-CM | POA: Diagnosis not present

## 2017-01-01 DIAGNOSIS — Z125 Encounter for screening for malignant neoplasm of prostate: Secondary | ICD-10-CM | POA: Diagnosis not present

## 2017-01-01 DIAGNOSIS — I10 Essential (primary) hypertension: Secondary | ICD-10-CM | POA: Diagnosis not present

## 2017-01-01 DIAGNOSIS — E7800 Pure hypercholesterolemia, unspecified: Secondary | ICD-10-CM | POA: Diagnosis not present

## 2017-01-02 ENCOUNTER — Encounter (HOSPITAL_COMMUNITY): Payer: Self-pay

## 2017-01-02 DIAGNOSIS — J438 Other emphysema: Secondary | ICD-10-CM

## 2017-01-02 NOTE — Progress Notes (Signed)
Discharge Summary  Patient Details  Name: Mark Robertson MRN: [id number] Date of Birth: 1941-07-18 Referring Provider:     Pulmonary Rehab Walk Test from 07/10/2016 in [facility]  Referring Provider  Dr.Yacoub [[facility] [place]       Number of Visits: 29  Reason for Discharge:  Patient reached a stable level of exercise. Patient independent in their exercise.  Smoking History:  History  Smoking Status  . Former Smoker  . Packs/day: 2.00  . Years: 50.00  . Types: Cigarettes  . Quit date: 10/13/2001  Smokeless Tobacco  . Not on file    Diagnosis:  Other emphysema ([place])  ADL UCSD:     Pulmonary Assessment Scores    Row Name 12/17/16 1226         ADL UCSD   ADL Phase Exit     SOB Score total 2        Initial Exercise Prescription:     Initial Exercise Prescription - 07/10/16 1600      Date of Initial Exercise RX and Referring Provider   Date 07/10/16   Referring Provider Dr.Yacoub  [facility]     Oxygen   Oxygen Continuous   Liters 2     Bike   Level 0.5   Minutes 17     NuStep   Level 2   Minutes 17   METs 1.5     Track   Laps 5   Minutes 17     Prescription Details   Frequency (times per week) 2   Duration Progress to 45 minutes of aerobic exercise without signs/symptoms of physical distress     Intensity   THRR 40-80% of Max Heartrate 58-116   Ratings of Perceived Exertion 11-13   Perceived Dyspnea 0-4     Progression   Progression Continue progressive overload as per policy without signs/symptoms or physical distress.     Resistance Training   Training Prescription Yes   Weight GREEN BAND   Reps 10-12      Discharge Exercise Prescription (Final Exercise Prescription Changes):     Exercise Prescription Changes - 12/11/16 1200      Response to Exercise   Blood Pressure (Admit) 154/68   Blood Pressure (Exercise) 164/80   Blood Pressure (Exit) 134/70   Heart Rate (Admit) 87 bpm   Heart Rate  (Exercise) 127 bpm   Heart Rate (Exit) 99 bpm   Oxygen Saturation (Admit) 94 %   Oxygen Saturation (Exercise) 88 %   Oxygen Saturation (Exit) 91 %   Rating of Perceived Exertion (Exercise) 12   Perceived Dyspnea (Exercise) 1   Duration Progress to 45 minutes of aerobic exercise without signs/symptoms of physical distress   Intensity THRR unchanged     Progression   Progression Continue to progress workloads to maintain intensity without signs/symptoms of physical distress.     Resistance Training   Training Prescription Yes   Weight green bands   Reps 10-15   Time 10 Minutes     Interval Training   Interval Training No     Bike   Level 1.2   Minutes 17     NuStep   Level 8   Minutes 17   METs 5.7      Functional Capacity:     6 Minute Walk    Row Name 07/10/16 1601 09/25/16 1417 12/18/16 0713     6 Minute Walk   Phase Initial Mid Program Discharge   Distance  800 feet 1200 feet 1624 feet   Walk Time 6 minutes 6 minutes 6 minutes   # of Rest Breaks 0 0 0   MPH 1.5 2.27 3.07   METS 2.15 2.68 3.3   RPE 11 11 12    Perceived Dyspnea  0 1 1   Symptoms No No No   Resting HR 81 bpm 97 bpm 92 bpm   Resting BP 140/82 164/70 156/70   Max Ex. HR 98 bpm 122 bpm 128 bpm   Max Ex. BP 164/80 190/90 180/80   2 Minute Post BP 140/84 150/90 160/80     Interval HR   Baseline HR 81 97 92   1 Minute HR 86 102 -  could not get accurate reading   2 Minute HR 97 113 114   3 Minute HR 98 114 122   4 Minute HR 93 114 122   5 Minute HR 94 118 122   6 Minute HR 112 122 128   2 Minute Post HR 90 118 110   Interval Heart Rate? Yes Yes Yes     Interval Oxygen   Interval Oxygen? Yes Yes Yes   Baseline Oxygen Saturation % 92 % 91 % 96 %   Baseline Liters of Oxygen 0 L 0 L 0 L   1 Minute Oxygen Saturation % 85 % 85 % -  could not get accurate reading   1 Minute Liters of Oxygen 0 L 0 L 0 L   2 Minute Oxygen Saturation % 94 % 91 % 91 %   2 Minute Liters of Oxygen 2 L 0 L 0 L    3 Minute Oxygen Saturation % 90 % 92 % 92 %   3 Minute Liters of Oxygen 2 L 0 L 0 L   4 Minute Oxygen Saturation % 93 % 90 % 92 %   4 Minute Liters of Oxygen 2 L 0 L 0 L   5 Minute Oxygen Saturation % 92 % 90 % 93 %   5 Minute Liters of Oxygen 2 L 0 L 0 L   6 Minute Oxygen Saturation % 96 % 90 % 91 %   6 Minute Liters of Oxygen 2 L 0 L 0 L   2 Minute Post Oxygen Saturation % 98 % 94 % 98 %   2 Minute Post Liters of Oxygen 2 L  - 0 L      Psychological, QOL, Others - Outcomes: PHQ 2/9: Depression screen [facility] 2/9 12/16/2016 07/04/2016  Decreased Interest 0 0  Down, Depressed, Hopeless 0 0  PHQ - 2 Score 0 0    Quality of Life:     Quality of Life - 12/17/16 1226      Quality of Life Scores   Health/Function Post 24.46 %   Socioeconomic Post 20.69 %   Psych/Spiritual Post 25.57 %   Family Post 0 %   GLOBAL Post 23.69 %      Personal Goals: Goals established at orientation with interventions provided to work toward goal.    Personal Goals Discharge:     Goals and Risk Factor Review    Row Name 08/19/16 [phone] 09/16/16 0713 10/07/16 0821 11/04/16 0954 11/24/16 1612     Core Components/Risk Factors/Patient Goals Review   Personal Goals Review Weight Management/Obesity;Increase Strength and Stamina;Increase knowledge of respiratory medications and ability to use respiratory devices properly.;Develop more efficient breathing techniques such as purse lipped breathing and diaphragmatic breathing and practicing self-pacing with activity.;Stress Weight  Management/Obesity;Increase Strength and Stamina;Increase knowledge of respiratory medications and ability to use respiratory devices properly.;Develop more efficient breathing techniques such as purse lipped breathing and diaphragmatic breathing and practicing self-pacing with activity.;Stress Weight Management/Obesity;Increase Strength and Stamina;Increase knowledge of respiratory medications and ability to use respiratory devices  properly.;Develop more efficient breathing techniques such as purse lipped breathing and diaphragmatic breathing and practicing self-pacing with activity.;Stress Weight Management/Obesity;Increase Strength and Stamina;Increase knowledge of respiratory medications and ability to use respiratory devices properly.;Develop more efficient breathing techniques such as purse lipped breathing and diaphragmatic breathing and practicing self-pacing with activity.;Stress Weight Management/Obesity;Increase Strength and Stamina;Increase knowledge of respiratory medications and ability to use respiratory devices properly.;Develop more efficient breathing techniques such as purse lipped breathing and diaphragmatic breathing and practicing self-pacing with activity.;Stress   Review see comments section on ITP see comments section on ITP see comments section on ITP see comments section on ITP see comments section on ITP   Expected Outcomes see admission expected outcomes see admission expected outcomes see admission expected outcomes see admission expected outcomes see admission expected outcomes      Nutrition & Weight - Outcomes:     Pre Biometrics - 07/10/16 1619      Pre Biometrics   Grip Strength 36 kg       Nutrition:     Nutrition Therapy & Goals - 08/05/16 1231      Nutrition Therapy   Diet General, Healthful      Personal Nutrition Goals   Nutrition Goal 1-2 lb wt loss/week to a wt loss goal of 6-24 lb at graduation from [place], educate and counsel regarding individualized specific dietary modifications aiming towards targeted core components such as weight, hypertension, lipid management, diabetes, heart failure and other comorbidities.   Expected Outcomes Short Term Goal: Understand basic principles of dietary content, such as calories, fat, sodium, cholesterol and nutrients.;Long Term Goal: Adherence to prescribed nutrition plan.       Nutrition Discharge:     Nutrition Assessments - 12/30/16 1020      Rate Your Plate Scores   Pre Score 54   Post Score 50      Education Questionnaire Score:     Knowledge Questionnaire Score - 12/17/16 1226      Knowledge Questionnaire Score   Post Score 10/13      Goals reviewed with patient; copy given to patient. Patient is continuing his exercise by attending his bi-weekly exercise session in the pulmonary rehab maintenance programs.

## 2017-01-06 ENCOUNTER — Encounter (HOSPITAL_COMMUNITY)
Admission: RE | Admit: 2017-01-06 | Discharge: 2017-01-06 | Disposition: A | Discharge status: Home / Self Care | Payer: Medicare Other | Source: Ambulatory Visit | Attending: Pulmonary Disease | Admitting: Pulmonary Disease

## 2017-01-08 ENCOUNTER — Encounter (HOSPITAL_COMMUNITY)
Admission: RE | Admit: 2017-01-08 | Discharge: 2017-01-08 | Disposition: A | Discharge status: Home / Self Care | Payer: Medicare Other | Source: Ambulatory Visit | Attending: Pulmonary Disease | Admitting: Pulmonary Disease

## 2017-01-13 ENCOUNTER — Encounter (HOSPITAL_COMMUNITY)
Admission: RE | Admit: 2017-01-13 | Discharge: 2017-01-13 | Disposition: A | Discharge status: Home / Self Care | Payer: Medicare Other | Source: Ambulatory Visit | Attending: Pulmonary Disease | Admitting: Pulmonary Disease

## 2017-01-13 DIAGNOSIS — J449 Chronic obstructive pulmonary disease, unspecified: Secondary | ICD-10-CM | POA: Insufficient documentation

## 2017-01-13 DIAGNOSIS — J438 Other emphysema: Secondary | ICD-10-CM | POA: Insufficient documentation

## 2017-01-13 DIAGNOSIS — I10 Essential (primary) hypertension: Secondary | ICD-10-CM | POA: Insufficient documentation

## 2017-01-13 DIAGNOSIS — F329 Major depressive disorder, single episode, unspecified: Secondary | ICD-10-CM | POA: Insufficient documentation

## 2017-01-13 DIAGNOSIS — K449 Diaphragmatic hernia without obstruction or gangrene: Secondary | ICD-10-CM | POA: Insufficient documentation

## 2017-01-13 DIAGNOSIS — E7800 Pure hypercholesterolemia, unspecified: Secondary | ICD-10-CM | POA: Insufficient documentation

## 2017-01-13 DIAGNOSIS — K224 Dyskinesia of esophagus: Secondary | ICD-10-CM | POA: Insufficient documentation

## 2017-01-14 DIAGNOSIS — I10 Essential (primary) hypertension: Secondary | ICD-10-CM | POA: Diagnosis not present

## 2017-01-14 DIAGNOSIS — E871 Hypo-osmolality and hyponatremia: Secondary | ICD-10-CM | POA: Diagnosis not present

## 2017-01-14 DIAGNOSIS — J449 Chronic obstructive pulmonary disease, unspecified: Secondary | ICD-10-CM | POA: Diagnosis not present

## 2017-01-14 DIAGNOSIS — Z0000 Encounter for general adult medical examination without abnormal findings: Secondary | ICD-10-CM | POA: Diagnosis not present

## 2017-01-15 ENCOUNTER — Encounter (HOSPITAL_COMMUNITY)
Admission: RE | Admit: 2017-01-15 | Discharge: 2017-01-15 | Disposition: A | Discharge status: Home / Self Care | Payer: Self-pay | Source: Ambulatory Visit | Attending: Pulmonary Disease | Admitting: Pulmonary Disease

## 2017-01-20 ENCOUNTER — Encounter (HOSPITAL_COMMUNITY)
Admission: RE | Admit: 2017-01-20 | Discharge: 2017-01-20 | Disposition: A | Discharge status: Home / Self Care | Payer: Self-pay | Source: Ambulatory Visit | Attending: Pulmonary Disease | Admitting: Pulmonary Disease

## 2017-01-22 ENCOUNTER — Encounter (HOSPITAL_COMMUNITY)
Admission: RE | Admit: 2017-01-22 | Discharge: 2017-01-22 | Disposition: A | Discharge status: Home / Self Care | Payer: Self-pay | Source: Ambulatory Visit | Attending: Pulmonary Disease | Admitting: Pulmonary Disease

## 2017-01-27 ENCOUNTER — Encounter (HOSPITAL_COMMUNITY)
Admission: RE | Admit: 2017-01-27 | Discharge: 2017-01-27 | Disposition: A | Discharge status: Home / Self Care | Payer: Self-pay | Source: Ambulatory Visit | Attending: Pulmonary Disease | Admitting: Pulmonary Disease

## 2017-01-29 ENCOUNTER — Encounter (HOSPITAL_COMMUNITY)
Admission: RE | Admit: 2017-01-29 | Discharge: 2017-01-29 | Disposition: A | Discharge status: Home / Self Care | Payer: Self-pay | Source: Ambulatory Visit | Attending: Pulmonary Disease | Admitting: Pulmonary Disease

## 2017-02-03 ENCOUNTER — Encounter (HOSPITAL_COMMUNITY)
Admission: RE | Admit: 2017-02-03 | Discharge: 2017-02-03 | Disposition: A | Discharge status: Home / Self Care | Payer: Self-pay | Source: Ambulatory Visit | Attending: Pulmonary Disease | Admitting: Pulmonary Disease

## 2017-02-05 ENCOUNTER — Encounter (HOSPITAL_COMMUNITY)
Admission: RE | Admit: 2017-02-05 | Discharge: 2017-02-05 | Disposition: A | Discharge status: Home / Self Care | Payer: Self-pay | Source: Ambulatory Visit | Attending: Pulmonary Disease | Admitting: Pulmonary Disease

## 2017-02-05 DIAGNOSIS — M545 Low back pain: Secondary | ICD-10-CM | POA: Diagnosis not present

## 2017-02-05 DIAGNOSIS — I10 Essential (primary) hypertension: Secondary | ICD-10-CM | POA: Diagnosis not present

## 2017-02-10 ENCOUNTER — Encounter (HOSPITAL_COMMUNITY)
Admission: RE | Admit: 2017-02-10 | Discharge: 2017-02-10 | Disposition: A | Discharge status: Home / Self Care | Payer: Medicare Other | Source: Ambulatory Visit | Attending: Pulmonary Disease | Admitting: Pulmonary Disease

## 2017-02-10 DIAGNOSIS — K224 Dyskinesia of esophagus: Secondary | ICD-10-CM | POA: Insufficient documentation

## 2017-02-10 DIAGNOSIS — E7800 Pure hypercholesterolemia, unspecified: Secondary | ICD-10-CM | POA: Insufficient documentation

## 2017-02-10 DIAGNOSIS — F329 Major depressive disorder, single episode, unspecified: Secondary | ICD-10-CM | POA: Insufficient documentation

## 2017-02-10 DIAGNOSIS — K449 Diaphragmatic hernia without obstruction or gangrene: Secondary | ICD-10-CM | POA: Insufficient documentation

## 2017-02-10 DIAGNOSIS — I10 Essential (primary) hypertension: Secondary | ICD-10-CM | POA: Insufficient documentation

## 2017-02-10 DIAGNOSIS — J438 Other emphysema: Secondary | ICD-10-CM | POA: Insufficient documentation

## 2017-02-10 DIAGNOSIS — J449 Chronic obstructive pulmonary disease, unspecified: Secondary | ICD-10-CM | POA: Insufficient documentation

## 2017-02-12 ENCOUNTER — Encounter (HOSPITAL_COMMUNITY)
Admission: RE | Admit: 2017-02-12 | Discharge: 2017-02-12 | Disposition: A | Discharge status: Home / Self Care | Payer: Medicare Other | Source: Ambulatory Visit | Attending: Pulmonary Disease | Admitting: Pulmonary Disease

## 2017-02-17 ENCOUNTER — Encounter (HOSPITAL_COMMUNITY)
Admission: RE | Admit: 2017-02-17 | Discharge: 2017-02-17 | Disposition: A | Discharge status: Home / Self Care | Payer: Medicare Other | Source: Ambulatory Visit | Attending: Pulmonary Disease | Admitting: Pulmonary Disease

## 2017-02-19 ENCOUNTER — Encounter (HOSPITAL_COMMUNITY)
Admission: RE | Admit: 2017-02-19 | Discharge: 2017-02-19 | Disposition: A | Discharge status: Home / Self Care | Payer: Medicare Other | Source: Ambulatory Visit | Attending: Pulmonary Disease | Admitting: Pulmonary Disease

## 2017-02-24 ENCOUNTER — Encounter (HOSPITAL_COMMUNITY)
Admission: RE | Admit: 2017-02-24 | Discharge: 2017-02-24 | Disposition: A | Discharge status: Home / Self Care | Payer: Medicare Other | Source: Ambulatory Visit | Attending: Pulmonary Disease | Admitting: Pulmonary Disease

## 2017-02-25 DIAGNOSIS — N183 Chronic kidney disease, stage 3 (moderate): Secondary | ICD-10-CM | POA: Diagnosis not present

## 2017-02-26 ENCOUNTER — Encounter (HOSPITAL_COMMUNITY)
Admission: RE | Admit: 2017-02-26 | Discharge: 2017-02-26 | Disposition: A | Discharge status: Home / Self Care | Payer: Medicare Other | Source: Ambulatory Visit | Attending: Pulmonary Disease | Admitting: Pulmonary Disease

## 2017-02-27 DIAGNOSIS — X32XXXD Exposure to sunlight, subsequent encounter: Secondary | ICD-10-CM | POA: Diagnosis not present

## 2017-02-27 DIAGNOSIS — D225 Melanocytic nevi of trunk: Secondary | ICD-10-CM | POA: Diagnosis not present

## 2017-02-27 DIAGNOSIS — L570 Actinic keratosis: Secondary | ICD-10-CM | POA: Diagnosis not present

## 2017-02-27 DIAGNOSIS — Z1283 Encounter for screening for malignant neoplasm of skin: Secondary | ICD-10-CM | POA: Diagnosis not present

## 2017-03-03 ENCOUNTER — Encounter (HOSPITAL_COMMUNITY)
Admission: RE | Admit: 2017-03-03 | Discharge: 2017-03-03 | Disposition: A | Discharge status: Home / Self Care | Payer: Medicare Other | Source: Ambulatory Visit | Attending: Pulmonary Disease | Admitting: Pulmonary Disease

## 2017-03-05 ENCOUNTER — Encounter (HOSPITAL_COMMUNITY)
Admission: RE | Admit: 2017-03-05 | Discharge: 2017-03-05 | Disposition: A | Discharge status: Home / Self Care | Payer: Medicare Other | Source: Ambulatory Visit | Attending: Pulmonary Disease | Admitting: Pulmonary Disease

## 2017-03-10 ENCOUNTER — Encounter (HOSPITAL_COMMUNITY)
Admission: RE | Admit: 2017-03-10 | Discharge: 2017-03-10 | Disposition: A | Discharge status: Home / Self Care | Payer: Medicare Other | Source: Ambulatory Visit | Attending: Pulmonary Disease | Admitting: Pulmonary Disease

## 2017-03-12 ENCOUNTER — Encounter (HOSPITAL_COMMUNITY)
Admission: RE | Admit: 2017-03-12 | Discharge: 2017-03-12 | Disposition: A | Discharge status: Home / Self Care | Payer: Medicare Other | Source: Ambulatory Visit | Attending: Pulmonary Disease | Admitting: Pulmonary Disease

## 2017-03-17 ENCOUNTER — Encounter (HOSPITAL_COMMUNITY): Payer: Self-pay | Attending: Pulmonary Disease

## 2017-03-17 DIAGNOSIS — K449 Diaphragmatic hernia without obstruction or gangrene: Secondary | ICD-10-CM | POA: Insufficient documentation

## 2017-03-17 DIAGNOSIS — J449 Chronic obstructive pulmonary disease, unspecified: Secondary | ICD-10-CM | POA: Insufficient documentation

## 2017-03-17 DIAGNOSIS — I10 Essential (primary) hypertension: Secondary | ICD-10-CM | POA: Insufficient documentation

## 2017-03-17 DIAGNOSIS — K224 Dyskinesia of esophagus: Secondary | ICD-10-CM | POA: Insufficient documentation

## 2017-03-17 DIAGNOSIS — F329 Major depressive disorder, single episode, unspecified: Secondary | ICD-10-CM | POA: Insufficient documentation

## 2017-03-17 DIAGNOSIS — J438 Other emphysema: Secondary | ICD-10-CM | POA: Insufficient documentation

## 2017-03-17 DIAGNOSIS — E7800 Pure hypercholesterolemia, unspecified: Secondary | ICD-10-CM | POA: Insufficient documentation

## 2017-03-18 NOTE — Progress Notes (Signed)
Mark Robertson has decided to discharge from the Pulmonary Maintenance Program as 5/31/20818. He is going to exercise on his own at the [facility].

## 2017-03-19 ENCOUNTER — Encounter (HOSPITAL_COMMUNITY): Admission: RE | Admit: 2017-03-19 | Payer: Self-pay | Source: Ambulatory Visit

## 2017-03-24 ENCOUNTER — Encounter (HOSPITAL_COMMUNITY): Payer: Self-pay

## 2017-03-26 ENCOUNTER — Encounter (HOSPITAL_COMMUNITY): Payer: Self-pay

## 2017-03-31 ENCOUNTER — Encounter (HOSPITAL_COMMUNITY): Payer: Self-pay

## 2017-04-02 ENCOUNTER — Encounter (HOSPITAL_COMMUNITY): Payer: Self-pay

## 2017-04-07 ENCOUNTER — Encounter (HOSPITAL_COMMUNITY): Payer: Self-pay

## 2017-04-09 ENCOUNTER — Encounter (HOSPITAL_COMMUNITY): Payer: Self-pay

## 2017-04-14 ENCOUNTER — Encounter (HOSPITAL_COMMUNITY): Payer: Medicare Other

## 2017-04-16 ENCOUNTER — Encounter (HOSPITAL_COMMUNITY): Payer: Medicare Other

## 2017-04-21 ENCOUNTER — Encounter (HOSPITAL_COMMUNITY): Payer: Medicare Other

## 2017-04-23 ENCOUNTER — Encounter (HOSPITAL_COMMUNITY): Payer: Medicare Other

## 2017-04-28 ENCOUNTER — Encounter (HOSPITAL_COMMUNITY): Payer: Medicare Other

## 2017-04-30 ENCOUNTER — Encounter (HOSPITAL_COMMUNITY): Payer: Medicare Other

## 2017-05-05 ENCOUNTER — Encounter (HOSPITAL_COMMUNITY): Payer: Medicare Other

## 2017-05-07 ENCOUNTER — Encounter (HOSPITAL_COMMUNITY): Payer: Medicare Other

## 2017-05-12 ENCOUNTER — Encounter (HOSPITAL_COMMUNITY): Payer: Medicare Other

## 2017-05-12 DIAGNOSIS — H1089 Other conjunctivitis: Secondary | ICD-10-CM | POA: Diagnosis not present

## 2017-05-14 ENCOUNTER — Encounter (HOSPITAL_COMMUNITY): Payer: Medicare Other

## 2017-05-19 ENCOUNTER — Encounter (HOSPITAL_COMMUNITY): Payer: Medicare Other

## 2017-05-21 ENCOUNTER — Other Ambulatory Visit: Payer: Self-pay

## 2017-05-21 ENCOUNTER — Encounter (HOSPITAL_COMMUNITY): Payer: Medicare Other

## 2017-05-21 NOTE — Patient Outreach (Signed)
[facility]) Care Management  05/21/2017  Mark Robertson 17-Jan-1941 [id number]   Medication Adherence call to Mark Robertson  The reason for this call is because [name] is showing past due under [place]. On his rosuvastatin 20 mg patient said he is only taking 1/2 of tablet per doctor [name] patient said he still has some medication and he did not need any medication right now.    [place] Management Direct Dial [phone]  Fax [phone] [name]

## 2017-05-25 ENCOUNTER — Other Ambulatory Visit: Payer: Self-pay

## 2017-05-25 NOTE — Patient Outreach (Addendum)
[facility]) Care Management  05/25/2017  Mark Robertson [birth year] [id number]    Telephone Screen  Referral Date: 05/22/17 Referral Source: Medication Adherence Campaign Call Referral Reason: "HTN, heart disease and  Insurance: [facility] Medicare/ [place]     Voicemail message received from patient. Return call placed to patient. Spoke with patient and screening call completed.  Social: patient resides in his home along with his grandson. He voices that he is the sole caregiver for his 36 yr old grandson as one parent died and the other one is incarcerated. patient states he is independent with all ADLs/IADLs. He drives himself to MD appts. He denies nay falls. He denies any assistive device usage.  Conditions: Per char review patient has PMH of emphysema/COPD, depression, hiatal hernia, HTN, HLD, esophageal thickening/dysmotility and OSA. Referral notes heart disease and patient denies any heart related medical issues. He states that he was in pulmonary rehab program but dropped out when his grandson got of school for the summer to take care of him. Since school is getting ready to resume again patient reports he is starting pulmonary rehab back on tomorrow. He voices that he will be going to rehab session every [place] mornings. Patient voices that he does not have "high blood pressure anymore because the meds make it normal." patient with some knowledge deficit related to his medical conditions. He voices that he could use further education and support in managing his chronic conditions.  Medications: Patient taking greater than 10 meds. He states he is managing his meds without any issues. He denies any issues with affordability as he reports most of his meds are covered through the [place].  Appointments: patient followed by PCP-saw in May and sess again next month. He also sees [place] MD's(dermatologist and pulmonologist( pt unable to recall names).  Advance Directives:  Patient reports that he has completed. No copy on file and requested.   Consent: [facility] services reviewed and discussed. Patient gave verbal consent for [facility] services. He denies any RN CM needs for care coordination but is agreeable to [place] for disease management.   Plan: RN CM will notify [facility] administrative assistant of case status. RN CM will send [place] referral for further disease management, education and support. RN CM will send [facility] pharmacy referral for polypharmacy med review.    Enzo Montgomery, RN,BSN,CCM [place] Management Telephonic Care Management Coordinator Direct Phone: [phone] Toll Free: [phone] Fax: [phone]

## 2017-05-25 NOTE — Patient Outreach (Signed)
[facility]) Care Management  05/25/2017  Mark Robertson Jul 04, 1941 [id number]   Telephone Screen  Referral Date: 05/22/17 Referral Source: Medication Adherence Campaign Call Referral Reason: "HTN, heart disease and  Insurance: [place]    Outreach attempt # 1 to patient. No answer. RN CM left HIPAA compliant voicemail message along with contact info.    Plan: RN CM will make outreach attempt to patient within three business days if no return call from patient.    Enzo Montgomery, RN,BSN,CCM [place] Management Telephonic Care Management Coordinator Direct Phone: [phone] Toll Free: [phone] Fax: [phone]

## 2017-05-26 ENCOUNTER — Encounter (HOSPITAL_COMMUNITY): Payer: Medicare Other

## 2017-05-26 ENCOUNTER — Encounter (HOSPITAL_COMMUNITY)
Admission: RE | Admit: 2017-05-26 | Discharge: 2017-05-26 | Disposition: A | Discharge status: Home / Self Care | Payer: Self-pay | Source: Ambulatory Visit | Attending: Pulmonary Disease | Admitting: Pulmonary Disease

## 2017-05-26 DIAGNOSIS — F329 Major depressive disorder, single episode, unspecified: Secondary | ICD-10-CM | POA: Insufficient documentation

## 2017-05-26 DIAGNOSIS — J438 Other emphysema: Secondary | ICD-10-CM | POA: Insufficient documentation

## 2017-05-26 DIAGNOSIS — K449 Diaphragmatic hernia without obstruction or gangrene: Secondary | ICD-10-CM | POA: Insufficient documentation

## 2017-05-26 DIAGNOSIS — K224 Dyskinesia of esophagus: Secondary | ICD-10-CM | POA: Insufficient documentation

## 2017-05-26 DIAGNOSIS — I10 Essential (primary) hypertension: Secondary | ICD-10-CM | POA: Insufficient documentation

## 2017-05-26 DIAGNOSIS — E7800 Pure hypercholesterolemia, unspecified: Secondary | ICD-10-CM | POA: Insufficient documentation

## 2017-05-26 DIAGNOSIS — J449 Chronic obstructive pulmonary disease, unspecified: Secondary | ICD-10-CM | POA: Insufficient documentation

## 2017-05-27 ENCOUNTER — Other Ambulatory Visit: Payer: Self-pay | Admitting: *Deleted

## 2017-05-27 NOTE — Patient Outreach (Signed)
[facility]) Care Management  05/27/2017  Mark Robertson 02-14-1941 [id number]  Referral Date :[id number] Referral Source: Telephonic Care Management Coordinator Screening Referral Reason: Further education and support in managing COPD Insurance: [place] received  Return telephone call from patient in voicemail. RN Health Coach returned call to patient. Patient stated that he is going to rehab and have been to [place] and blown in a glass thing. Patient stated he didn't need any oxygen. He had been to classes at [facility] and he did not need any more education. RN Health Coach made patient aware that this is voluntary. Patient declined services.  Plan: RN will send patient closure letter RN will send physician closure letter [place] Management [phone]

## 2017-05-27 NOTE — Patient Outreach (Signed)
[facility]) Care Management  05/27/2017  SHELTON SQUARE 1941/03/20 [id number]   RN Health Coach attempted #1 follow up outreach call to patient.  Patient was unavailable. HIPPA compliance voicemail message left with return callback number.  Plan: RN will call patient again within 14 days.  [facility] Care Management [phone]

## 2017-05-27 NOTE — Progress Notes (Signed)
Mark Robertson restarted the Pulmonary Maintenance Program 05/26/17. He states that he has not exercised any since he stopped the program. He tolerated exercise well without complaint.

## 2017-05-28 ENCOUNTER — Encounter (HOSPITAL_COMMUNITY)
Admission: RE | Admit: 2017-05-28 | Discharge: 2017-05-28 | Disposition: A | Discharge status: Home / Self Care | Payer: Self-pay | Source: Ambulatory Visit | Attending: Internal Medicine | Admitting: Internal Medicine

## 2017-05-28 ENCOUNTER — Encounter (HOSPITAL_COMMUNITY): Payer: Medicare Other

## 2017-05-28 ENCOUNTER — Telehealth: Payer: Self-pay | Admitting: Pharmacist

## 2017-05-28 NOTE — Patient Outreach (Signed)
[facility]) Care Management  05/28/2017  Mark Robertson 1940/12/12 [id number]   Patient was called regarding medication management per referral from [place], [name].  Unfortunately, patient did not answer the phone. HIPAA compliant message left on patient's voicemail.   Plan:  Follow with patient in 5-7 business days.   Elayne Guerin, PharmD, [place] Clinical Pharmacist [phone]

## 2017-05-28 NOTE — Patient Outreach (Signed)
[facility]) Care Management  05/28/2017  Mark Robertson 1941-09-15 [id number]   Patient was called regarding medication management per referral from [place], [name].  Unfortunately, patient did not answer the phone. HIPAA compliant message left on patient's voicemail.   Plan:  Follow with patient in 5-7 business days.   Elayne Guerin, PharmD, [place] Clinical Pharmacist [phone]

## 2017-05-28 NOTE — Progress Notes (Signed)
This encounter was created in error - please disregard.

## 2017-06-02 ENCOUNTER — Encounter (HOSPITAL_COMMUNITY)
Admission: RE | Admit: 2017-06-02 | Discharge: 2017-06-02 | Disposition: A | Discharge status: Home / Self Care | Payer: Self-pay | Source: Ambulatory Visit | Attending: Internal Medicine | Admitting: Internal Medicine

## 2017-06-02 ENCOUNTER — Encounter (HOSPITAL_COMMUNITY): Payer: Medicare Other

## 2017-06-04 ENCOUNTER — Encounter (HOSPITAL_COMMUNITY)
Admission: RE | Admit: 2017-06-04 | Discharge: 2017-06-04 | Disposition: A | Discharge status: Home / Self Care | Payer: Self-pay | Source: Ambulatory Visit | Attending: Internal Medicine | Admitting: Internal Medicine

## 2017-06-04 ENCOUNTER — Encounter (HOSPITAL_COMMUNITY): Payer: Medicare Other

## 2017-06-04 ENCOUNTER — Other Ambulatory Visit: Payer: Self-pay | Admitting: Pharmacist

## 2017-06-04 NOTE — Patient Outreach (Signed)
[facility]) Care Management  06/04/2017  Mark Robertson Mar 14, 1941 [id number]   Patient was called regarding medication management per referral from [place], [name].  Unfortunately, patient did not answer the phone. HIPAA compliant message left on patient's voicemail.   Plan:  Follow with patient in 5-7 business days.   Elayne Guerin, PharmD, [place] Clinical Pharmacist [phone]

## 2017-06-09 ENCOUNTER — Encounter (HOSPITAL_COMMUNITY): Payer: Medicare Other

## 2017-06-09 ENCOUNTER — Encounter (HOSPITAL_COMMUNITY)
Admission: RE | Admit: 2017-06-09 | Discharge: 2017-06-09 | Disposition: A | Discharge status: Home / Self Care | Payer: Self-pay | Source: Ambulatory Visit | Attending: Internal Medicine | Admitting: Internal Medicine

## 2017-06-10 ENCOUNTER — Ambulatory Visit: Payer: Self-pay | Admitting: Pharmacist

## 2017-06-10 ENCOUNTER — Ambulatory Visit: Payer: Self-pay | Admitting: *Deleted

## 2017-06-11 ENCOUNTER — Other Ambulatory Visit: Payer: Self-pay | Admitting: Pharmacist

## 2017-06-11 ENCOUNTER — Encounter (HOSPITAL_COMMUNITY)
Admission: RE | Admit: 2017-06-11 | Discharge: 2017-06-11 | Disposition: A | Discharge status: Home / Self Care | Payer: Self-pay | Source: Ambulatory Visit | Attending: Internal Medicine | Admitting: Internal Medicine

## 2017-06-11 ENCOUNTER — Encounter (HOSPITAL_COMMUNITY): Payer: Medicare Other

## 2017-06-11 NOTE — Patient Outreach (Signed)
[facility]) Care Management  06/11/2017  Mark Robertson November 03, 1940 [id number]   Patient was called regarding medication management per referral from [place], [name]. Unfortunately, patient did not answer the phone. HIPAA compliant message left on patient's voicemail.   Plan: Send patient an unsuccessful contact letter Close patient's case if I do not hear back from him within 10 business days.   Elayne Guerin, PharmD, [place] Clinical Pharmacist [phone]

## 2017-06-16 ENCOUNTER — Encounter (HOSPITAL_COMMUNITY): Payer: Medicare Other

## 2017-06-16 ENCOUNTER — Encounter (HOSPITAL_COMMUNITY)
Admission: RE | Admit: 2017-06-16 | Discharge: 2017-06-16 | Disposition: A | Discharge status: Home / Self Care | Payer: Self-pay | Source: Ambulatory Visit | Attending: Pulmonary Disease | Admitting: Pulmonary Disease

## 2017-06-16 DIAGNOSIS — I10 Essential (primary) hypertension: Secondary | ICD-10-CM | POA: Insufficient documentation

## 2017-06-16 DIAGNOSIS — K224 Dyskinesia of esophagus: Secondary | ICD-10-CM | POA: Insufficient documentation

## 2017-06-16 DIAGNOSIS — K449 Diaphragmatic hernia without obstruction or gangrene: Secondary | ICD-10-CM | POA: Insufficient documentation

## 2017-06-16 DIAGNOSIS — J438 Other emphysema: Secondary | ICD-10-CM | POA: Insufficient documentation

## 2017-06-16 DIAGNOSIS — J449 Chronic obstructive pulmonary disease, unspecified: Secondary | ICD-10-CM | POA: Insufficient documentation

## 2017-06-16 DIAGNOSIS — F329 Major depressive disorder, single episode, unspecified: Secondary | ICD-10-CM | POA: Insufficient documentation

## 2017-06-16 DIAGNOSIS — E7800 Pure hypercholesterolemia, unspecified: Secondary | ICD-10-CM | POA: Insufficient documentation

## 2017-06-18 ENCOUNTER — Encounter (HOSPITAL_COMMUNITY): Payer: Medicare Other

## 2017-06-18 ENCOUNTER — Encounter (HOSPITAL_COMMUNITY)
Admission: RE | Admit: 2017-06-18 | Discharge: 2017-06-18 | Disposition: A | Discharge status: Home / Self Care | Payer: Self-pay | Source: Ambulatory Visit | Attending: Internal Medicine | Admitting: Internal Medicine

## 2017-06-23 ENCOUNTER — Encounter (HOSPITAL_COMMUNITY)
Admission: RE | Admit: 2017-06-23 | Discharge: 2017-06-23 | Disposition: A | Discharge status: Home / Self Care | Payer: Self-pay | Source: Ambulatory Visit | Attending: Internal Medicine | Admitting: Internal Medicine

## 2017-06-23 ENCOUNTER — Encounter (HOSPITAL_COMMUNITY): Payer: Medicare Other

## 2017-06-25 ENCOUNTER — Encounter (HOSPITAL_COMMUNITY): Payer: Medicare Other

## 2017-06-25 ENCOUNTER — Encounter (HOSPITAL_COMMUNITY)
Admission: RE | Admit: 2017-06-25 | Discharge: 2017-06-25 | Disposition: A | Discharge status: Home / Self Care | Payer: Self-pay | Source: Ambulatory Visit | Attending: Internal Medicine | Admitting: Internal Medicine

## 2017-06-29 ENCOUNTER — Other Ambulatory Visit: Payer: Self-pay | Admitting: Pharmacist

## 2017-06-29 NOTE — Patient Outreach (Addendum)
[facility]) Care Management  06/29/2017  ONDRE SALVETTI [birth year] [id number]   Patient was called regarding medication management per referral from [place], [name]. Unfortunately, patient did not answer the phone nor did he respond to the unsuccessful contact letter he was sent.   Plan: Patient's pharmacy case will be closed.  Patient and provider will be sent a case closure letter [facility] CMA will be notified of case closure.    Elayne Guerin, PharmD, [place] Clinical Pharmacist [phone]

## 2017-06-30 ENCOUNTER — Encounter (HOSPITAL_COMMUNITY)
Admission: RE | Admit: 2017-06-30 | Discharge: 2017-06-30 | Disposition: A | Discharge status: Home / Self Care | Payer: Self-pay | Source: Ambulatory Visit | Attending: Internal Medicine | Admitting: Internal Medicine

## 2017-06-30 ENCOUNTER — Encounter (HOSPITAL_COMMUNITY): Payer: Medicare Other

## 2017-07-02 ENCOUNTER — Encounter (HOSPITAL_COMMUNITY)
Admission: RE | Admit: 2017-07-02 | Discharge: 2017-07-02 | Disposition: A | Discharge status: Home / Self Care | Payer: Self-pay | Source: Ambulatory Visit | Attending: Internal Medicine | Admitting: Internal Medicine

## 2017-07-02 ENCOUNTER — Encounter (HOSPITAL_COMMUNITY): Payer: Medicare Other

## 2017-07-07 ENCOUNTER — Encounter (HOSPITAL_COMMUNITY): Payer: Medicare Other

## 2017-07-07 ENCOUNTER — Encounter (HOSPITAL_COMMUNITY)
Admission: RE | Admit: 2017-07-07 | Discharge: 2017-07-07 | Disposition: A | Discharge status: Home / Self Care | Payer: Self-pay | Source: Ambulatory Visit | Attending: Internal Medicine | Admitting: Internal Medicine

## 2017-07-09 ENCOUNTER — Encounter (HOSPITAL_COMMUNITY)
Admission: RE | Admit: 2017-07-09 | Discharge: 2017-07-09 | Disposition: A | Discharge status: Home / Self Care | Payer: Medicare Other | Source: Ambulatory Visit | Attending: Internal Medicine | Admitting: Internal Medicine

## 2017-07-09 ENCOUNTER — Encounter (HOSPITAL_COMMUNITY): Payer: Medicare Other

## 2017-07-09 DIAGNOSIS — I10 Essential (primary) hypertension: Secondary | ICD-10-CM | POA: Diagnosis not present

## 2017-07-09 DIAGNOSIS — E7800 Pure hypercholesterolemia, unspecified: Secondary | ICD-10-CM | POA: Diagnosis not present

## 2017-07-13 DIAGNOSIS — N183 Chronic kidney disease, stage 3 (moderate): Secondary | ICD-10-CM | POA: Diagnosis not present

## 2017-07-13 DIAGNOSIS — Z23 Encounter for immunization: Secondary | ICD-10-CM | POA: Diagnosis not present

## 2017-07-13 DIAGNOSIS — M545 Low back pain: Secondary | ICD-10-CM | POA: Diagnosis not present

## 2017-07-13 DIAGNOSIS — I10 Essential (primary) hypertension: Secondary | ICD-10-CM | POA: Diagnosis not present

## 2017-07-14 ENCOUNTER — Encounter (HOSPITAL_COMMUNITY)
Admission: RE | Admit: 2017-07-14 | Discharge: 2017-07-14 | Disposition: A | Discharge status: Home / Self Care | Payer: Self-pay | Source: Ambulatory Visit | Attending: Pulmonary Disease | Admitting: Pulmonary Disease

## 2017-07-14 ENCOUNTER — Encounter (HOSPITAL_COMMUNITY): Payer: Self-pay

## 2017-07-14 DIAGNOSIS — I10 Essential (primary) hypertension: Secondary | ICD-10-CM | POA: Insufficient documentation

## 2017-07-14 DIAGNOSIS — J438 Other emphysema: Secondary | ICD-10-CM | POA: Insufficient documentation

## 2017-07-14 DIAGNOSIS — F329 Major depressive disorder, single episode, unspecified: Secondary | ICD-10-CM | POA: Insufficient documentation

## 2017-07-14 DIAGNOSIS — J449 Chronic obstructive pulmonary disease, unspecified: Secondary | ICD-10-CM | POA: Insufficient documentation

## 2017-07-14 DIAGNOSIS — K224 Dyskinesia of esophagus: Secondary | ICD-10-CM | POA: Insufficient documentation

## 2017-07-14 DIAGNOSIS — K449 Diaphragmatic hernia without obstruction or gangrene: Secondary | ICD-10-CM | POA: Insufficient documentation

## 2017-07-14 DIAGNOSIS — E7800 Pure hypercholesterolemia, unspecified: Secondary | ICD-10-CM | POA: Insufficient documentation

## 2017-07-16 ENCOUNTER — Encounter (HOSPITAL_COMMUNITY)
Admission: RE | Admit: 2017-07-16 | Discharge: 2017-07-16 | Disposition: A | Discharge status: Home / Self Care | Payer: Self-pay | Source: Ambulatory Visit | Attending: Internal Medicine | Admitting: Internal Medicine

## 2017-07-16 ENCOUNTER — Encounter (HOSPITAL_COMMUNITY): Payer: Self-pay

## 2017-07-21 ENCOUNTER — Encounter (HOSPITAL_COMMUNITY)
Admission: RE | Admit: 2017-07-21 | Discharge: 2017-07-21 | Disposition: A | Discharge status: Home / Self Care | Payer: Self-pay | Source: Ambulatory Visit | Attending: Internal Medicine | Admitting: Internal Medicine

## 2017-07-21 ENCOUNTER — Encounter (HOSPITAL_COMMUNITY): Payer: Self-pay

## 2017-07-23 ENCOUNTER — Encounter (HOSPITAL_COMMUNITY)
Admission: RE | Admit: 2017-07-23 | Discharge: 2017-07-23 | Disposition: A | Discharge status: Home / Self Care | Payer: Self-pay | Source: Ambulatory Visit | Attending: Internal Medicine | Admitting: Internal Medicine

## 2017-07-28 ENCOUNTER — Encounter (HOSPITAL_COMMUNITY)
Admission: RE | Admit: 2017-07-28 | Discharge: 2017-07-28 | Disposition: A | Discharge status: Home / Self Care | Payer: Self-pay | Source: Ambulatory Visit | Attending: Internal Medicine | Admitting: Internal Medicine

## 2017-07-30 ENCOUNTER — Encounter (HOSPITAL_COMMUNITY)
Admission: RE | Admit: 2017-07-30 | Discharge: 2017-07-30 | Disposition: A | Discharge status: Home / Self Care | Payer: Self-pay | Source: Ambulatory Visit | Attending: Internal Medicine | Admitting: Internal Medicine

## 2017-08-04 ENCOUNTER — Encounter (HOSPITAL_COMMUNITY)
Admission: RE | Admit: 2017-08-04 | Discharge: 2017-08-04 | Disposition: A | Discharge status: Home / Self Care | Payer: Self-pay | Source: Ambulatory Visit | Attending: Internal Medicine | Admitting: Internal Medicine

## 2017-08-06 ENCOUNTER — Encounter (HOSPITAL_COMMUNITY)
Admission: RE | Admit: 2017-08-06 | Discharge: 2017-08-06 | Disposition: A | Discharge status: Home / Self Care | Payer: Self-pay | Source: Ambulatory Visit | Attending: Internal Medicine | Admitting: Internal Medicine

## 2017-08-11 ENCOUNTER — Encounter (HOSPITAL_COMMUNITY)
Admission: RE | Admit: 2017-08-11 | Discharge: 2017-08-11 | Disposition: A | Discharge status: Home / Self Care | Payer: Self-pay | Source: Ambulatory Visit | Attending: Internal Medicine | Admitting: Internal Medicine

## 2017-08-13 ENCOUNTER — Encounter (HOSPITAL_COMMUNITY)
Admission: RE | Admit: 2017-08-13 | Discharge: 2017-08-13 | Disposition: A | Discharge status: Home / Self Care | Payer: Self-pay | Source: Ambulatory Visit | Attending: Pulmonary Disease | Admitting: Pulmonary Disease

## 2017-08-13 DIAGNOSIS — K449 Diaphragmatic hernia without obstruction or gangrene: Secondary | ICD-10-CM | POA: Insufficient documentation

## 2017-08-13 DIAGNOSIS — E7800 Pure hypercholesterolemia, unspecified: Secondary | ICD-10-CM | POA: Insufficient documentation

## 2017-08-13 DIAGNOSIS — I10 Essential (primary) hypertension: Secondary | ICD-10-CM | POA: Insufficient documentation

## 2017-08-13 DIAGNOSIS — J438 Other emphysema: Secondary | ICD-10-CM | POA: Insufficient documentation

## 2017-08-13 DIAGNOSIS — J449 Chronic obstructive pulmonary disease, unspecified: Secondary | ICD-10-CM | POA: Insufficient documentation

## 2017-08-13 DIAGNOSIS — K224 Dyskinesia of esophagus: Secondary | ICD-10-CM | POA: Insufficient documentation

## 2017-08-13 DIAGNOSIS — F329 Major depressive disorder, single episode, unspecified: Secondary | ICD-10-CM | POA: Insufficient documentation

## 2017-08-18 ENCOUNTER — Encounter (HOSPITAL_COMMUNITY): Payer: Self-pay

## 2017-08-20 ENCOUNTER — Encounter (HOSPITAL_COMMUNITY)
Admission: RE | Admit: 2017-08-20 | Discharge: 2017-08-20 | Disposition: A | Discharge status: Home / Self Care | Payer: Self-pay | Source: Ambulatory Visit | Attending: Internal Medicine | Admitting: Internal Medicine

## 2017-08-25 ENCOUNTER — Encounter (HOSPITAL_COMMUNITY)
Admission: RE | Admit: 2017-08-25 | Discharge: 2017-08-25 | Disposition: A | Discharge status: Home / Self Care | Payer: Self-pay | Source: Ambulatory Visit | Attending: Internal Medicine | Admitting: Internal Medicine

## 2017-08-27 ENCOUNTER — Encounter (HOSPITAL_COMMUNITY): Payer: Self-pay

## 2017-08-27 ENCOUNTER — Encounter (HOSPITAL_COMMUNITY)
Admission: RE | Admit: 2017-08-27 | Discharge: 2017-08-27 | Disposition: A | Discharge status: Home / Self Care | Payer: Self-pay | Source: Ambulatory Visit | Attending: Internal Medicine | Admitting: Internal Medicine

## 2017-08-27 DIAGNOSIS — H16143 Punctate keratitis, bilateral: Secondary | ICD-10-CM | POA: Diagnosis not present

## 2017-09-01 ENCOUNTER — Encounter (HOSPITAL_COMMUNITY)
Admission: RE | Admit: 2017-09-01 | Discharge: 2017-09-01 | Disposition: A | Discharge status: Home / Self Care | Payer: Self-pay | Source: Ambulatory Visit | Attending: Internal Medicine | Admitting: Internal Medicine

## 2017-09-08 ENCOUNTER — Encounter (HOSPITAL_COMMUNITY)
Admission: RE | Admit: 2017-09-08 | Discharge: 2017-09-08 | Disposition: A | Discharge status: Home / Self Care | Payer: Self-pay | Source: Ambulatory Visit | Attending: Internal Medicine | Admitting: Internal Medicine

## 2017-09-10 ENCOUNTER — Encounter (HOSPITAL_COMMUNITY)
Admission: RE | Admit: 2017-09-10 | Discharge: 2017-09-10 | Disposition: A | Discharge status: Home / Self Care | Payer: Self-pay | Source: Ambulatory Visit | Attending: Internal Medicine | Admitting: Internal Medicine

## 2017-09-15 ENCOUNTER — Encounter (HOSPITAL_COMMUNITY)
Admission: RE | Admit: 2017-09-15 | Discharge: 2017-09-15 | Disposition: A | Discharge status: Home / Self Care | Payer: Self-pay | Source: Ambulatory Visit | Attending: Pulmonary Disease | Admitting: Pulmonary Disease

## 2017-09-15 DIAGNOSIS — J449 Chronic obstructive pulmonary disease, unspecified: Secondary | ICD-10-CM | POA: Insufficient documentation

## 2017-09-15 DIAGNOSIS — I10 Essential (primary) hypertension: Secondary | ICD-10-CM | POA: Diagnosis not present

## 2017-09-15 DIAGNOSIS — K449 Diaphragmatic hernia without obstruction or gangrene: Secondary | ICD-10-CM | POA: Insufficient documentation

## 2017-09-15 DIAGNOSIS — E7800 Pure hypercholesterolemia, unspecified: Secondary | ICD-10-CM | POA: Insufficient documentation

## 2017-09-15 DIAGNOSIS — J438 Other emphysema: Secondary | ICD-10-CM | POA: Insufficient documentation

## 2017-09-15 DIAGNOSIS — N183 Chronic kidney disease, stage 3 (moderate): Secondary | ICD-10-CM | POA: Diagnosis not present

## 2017-09-15 DIAGNOSIS — M79673 Pain in unspecified foot: Secondary | ICD-10-CM | POA: Diagnosis not present

## 2017-09-15 DIAGNOSIS — K224 Dyskinesia of esophagus: Secondary | ICD-10-CM | POA: Insufficient documentation

## 2017-09-15 DIAGNOSIS — F329 Major depressive disorder, single episode, unspecified: Secondary | ICD-10-CM | POA: Insufficient documentation

## 2017-09-17 ENCOUNTER — Encounter (HOSPITAL_COMMUNITY)
Admission: RE | Admit: 2017-09-17 | Discharge: 2017-09-17 | Disposition: A | Discharge status: Home / Self Care | Payer: Self-pay | Source: Ambulatory Visit | Attending: Internal Medicine | Admitting: Internal Medicine

## 2017-09-22 ENCOUNTER — Encounter (HOSPITAL_COMMUNITY): Payer: Medicare Other

## 2017-09-24 ENCOUNTER — Encounter (HOSPITAL_COMMUNITY)
Admission: RE | Admit: 2017-09-24 | Discharge: 2017-09-24 | Disposition: A | Discharge status: Home / Self Care | Payer: Self-pay | Source: Ambulatory Visit | Attending: Internal Medicine | Admitting: Internal Medicine

## 2017-09-25 DIAGNOSIS — H524 Presbyopia: Secondary | ICD-10-CM | POA: Diagnosis not present

## 2017-09-29 ENCOUNTER — Encounter (HOSPITAL_COMMUNITY)
Admission: RE | Admit: 2017-09-29 | Discharge: 2017-09-29 | Disposition: A | Discharge status: Home / Self Care | Payer: Self-pay | Source: Ambulatory Visit | Attending: Internal Medicine | Admitting: Internal Medicine

## 2017-10-01 ENCOUNTER — Encounter (HOSPITAL_COMMUNITY): Payer: Medicare Other

## 2017-10-08 ENCOUNTER — Encounter (HOSPITAL_COMMUNITY)
Admission: RE | Admit: 2017-10-08 | Discharge: 2017-10-08 | Disposition: A | Discharge status: Home / Self Care | Payer: Self-pay | Source: Ambulatory Visit | Attending: Internal Medicine | Admitting: Internal Medicine

## 2017-10-15 ENCOUNTER — Encounter (HOSPITAL_COMMUNITY)
Admission: RE | Admit: 2017-10-15 | Discharge: 2017-10-15 | Disposition: A | Discharge status: Home / Self Care | Payer: Self-pay | Source: Ambulatory Visit | Attending: Pulmonary Disease | Admitting: Pulmonary Disease

## 2017-10-15 DIAGNOSIS — J449 Chronic obstructive pulmonary disease, unspecified: Secondary | ICD-10-CM | POA: Insufficient documentation

## 2017-10-15 DIAGNOSIS — J438 Other emphysema: Secondary | ICD-10-CM | POA: Insufficient documentation

## 2017-10-15 DIAGNOSIS — E7800 Pure hypercholesterolemia, unspecified: Secondary | ICD-10-CM | POA: Insufficient documentation

## 2017-10-15 DIAGNOSIS — I10 Essential (primary) hypertension: Secondary | ICD-10-CM | POA: Insufficient documentation

## 2017-10-15 DIAGNOSIS — K449 Diaphragmatic hernia without obstruction or gangrene: Secondary | ICD-10-CM | POA: Insufficient documentation

## 2017-10-15 DIAGNOSIS — K224 Dyskinesia of esophagus: Secondary | ICD-10-CM | POA: Insufficient documentation

## 2017-10-15 DIAGNOSIS — F329 Major depressive disorder, single episode, unspecified: Secondary | ICD-10-CM | POA: Insufficient documentation

## 2017-10-20 ENCOUNTER — Encounter (HOSPITAL_COMMUNITY)
Admission: RE | Admit: 2017-10-20 | Discharge: 2017-10-20 | Disposition: A | Discharge status: Home / Self Care | Payer: Self-pay | Source: Ambulatory Visit | Attending: Internal Medicine | Admitting: Internal Medicine

## 2017-10-22 ENCOUNTER — Encounter (HOSPITAL_COMMUNITY): Payer: Medicare Other

## 2017-10-27 ENCOUNTER — Encounter (HOSPITAL_COMMUNITY): Payer: Medicare Other

## 2017-10-29 ENCOUNTER — Encounter (HOSPITAL_COMMUNITY)
Admission: RE | Admit: 2017-10-29 | Discharge: 2017-10-29 | Disposition: A | Discharge status: Home / Self Care | Payer: Self-pay | Source: Ambulatory Visit | Attending: Internal Medicine | Admitting: Internal Medicine

## 2017-11-03 ENCOUNTER — Encounter (HOSPITAL_COMMUNITY)
Admission: RE | Admit: 2017-11-03 | Discharge: 2017-11-03 | Disposition: A | Discharge status: Home / Self Care | Payer: Medicare Other | Source: Ambulatory Visit | Attending: Internal Medicine | Admitting: Internal Medicine

## 2017-11-05 ENCOUNTER — Encounter (HOSPITAL_COMMUNITY)
Admission: RE | Admit: 2017-11-05 | Discharge: 2017-11-05 | Disposition: A | Discharge status: Home / Self Care | Payer: Self-pay | Source: Ambulatory Visit | Attending: Internal Medicine | Admitting: Internal Medicine

## 2017-11-09 DIAGNOSIS — M25571 Pain in right ankle and joints of right foot: Secondary | ICD-10-CM | POA: Diagnosis not present

## 2017-11-09 DIAGNOSIS — S82821A Torus fracture of lower end of right fibula, initial encounter for closed fracture: Secondary | ICD-10-CM | POA: Diagnosis not present

## 2017-11-10 ENCOUNTER — Encounter (HOSPITAL_COMMUNITY)
Admission: RE | Admit: 2017-11-10 | Discharge: 2017-11-10 | Disposition: A | Discharge status: Home / Self Care | Payer: Self-pay | Source: Ambulatory Visit | Attending: Internal Medicine | Admitting: Internal Medicine

## 2017-11-12 ENCOUNTER — Encounter (HOSPITAL_COMMUNITY)
Admission: RE | Admit: 2017-11-12 | Discharge: 2017-11-12 | Disposition: A | Discharge status: Home / Self Care | Payer: Self-pay | Source: Ambulatory Visit | Attending: Internal Medicine | Admitting: Internal Medicine

## 2017-11-17 ENCOUNTER — Encounter (HOSPITAL_COMMUNITY)
Admission: RE | Admit: 2017-11-17 | Discharge: 2017-11-17 | Disposition: A | Discharge status: Home / Self Care | Payer: Self-pay | Source: Ambulatory Visit | Attending: Pulmonary Disease | Admitting: Pulmonary Disease

## 2017-11-17 DIAGNOSIS — J438 Other emphysema: Secondary | ICD-10-CM | POA: Insufficient documentation

## 2017-11-17 DIAGNOSIS — I10 Essential (primary) hypertension: Secondary | ICD-10-CM | POA: Insufficient documentation

## 2017-11-17 DIAGNOSIS — K224 Dyskinesia of esophagus: Secondary | ICD-10-CM | POA: Insufficient documentation

## 2017-11-17 DIAGNOSIS — E7800 Pure hypercholesterolemia, unspecified: Secondary | ICD-10-CM | POA: Insufficient documentation

## 2017-11-17 DIAGNOSIS — J449 Chronic obstructive pulmonary disease, unspecified: Secondary | ICD-10-CM | POA: Insufficient documentation

## 2017-11-17 DIAGNOSIS — F329 Major depressive disorder, single episode, unspecified: Secondary | ICD-10-CM | POA: Insufficient documentation

## 2017-11-17 DIAGNOSIS — K449 Diaphragmatic hernia without obstruction or gangrene: Secondary | ICD-10-CM | POA: Insufficient documentation

## 2017-11-19 ENCOUNTER — Encounter (HOSPITAL_COMMUNITY)
Admission: RE | Admit: 2017-11-19 | Discharge: 2017-11-19 | Disposition: A | Discharge status: Home / Self Care | Payer: Self-pay | Source: Ambulatory Visit | Attending: Internal Medicine | Admitting: Internal Medicine

## 2017-11-23 DIAGNOSIS — S82821A Torus fracture of lower end of right fibula, initial encounter for closed fracture: Secondary | ICD-10-CM | POA: Diagnosis not present

## 2017-11-23 DIAGNOSIS — M25572 Pain in left ankle and joints of left foot: Secondary | ICD-10-CM | POA: Diagnosis not present

## 2017-11-23 DIAGNOSIS — M79675 Pain in left toe(s): Secondary | ICD-10-CM | POA: Diagnosis not present

## 2017-11-24 ENCOUNTER — Encounter (HOSPITAL_COMMUNITY)
Admission: RE | Admit: 2017-11-24 | Discharge: 2017-11-24 | Disposition: A | Discharge status: Home / Self Care | Payer: Self-pay | Source: Ambulatory Visit | Attending: Internal Medicine | Admitting: Internal Medicine

## 2017-11-26 ENCOUNTER — Encounter (HOSPITAL_COMMUNITY)
Admission: RE | Admit: 2017-11-26 | Discharge: 2017-11-26 | Disposition: A | Discharge status: Home / Self Care | Payer: Self-pay | Source: Ambulatory Visit | Attending: Internal Medicine | Admitting: Internal Medicine

## 2017-12-01 ENCOUNTER — Encounter (HOSPITAL_COMMUNITY)
Admission: RE | Admit: 2017-12-01 | Discharge: 2017-12-01 | Disposition: A | Discharge status: Home / Self Care | Payer: Self-pay | Source: Ambulatory Visit | Attending: Internal Medicine | Admitting: Internal Medicine

## 2017-12-03 ENCOUNTER — Encounter (HOSPITAL_COMMUNITY)
Admission: RE | Admit: 2017-12-03 | Discharge: 2017-12-03 | Disposition: A | Discharge status: Home / Self Care | Payer: Self-pay | Source: Ambulatory Visit | Attending: Internal Medicine | Admitting: Internal Medicine

## 2017-12-04 DIAGNOSIS — M545 Low back pain: Secondary | ICD-10-CM | POA: Diagnosis not present

## 2017-12-04 DIAGNOSIS — M5136 Other intervertebral disc degeneration, lumbar region: Secondary | ICD-10-CM | POA: Diagnosis not present

## 2017-12-04 DIAGNOSIS — M4310 Spondylolisthesis, site unspecified: Secondary | ICD-10-CM | POA: Diagnosis not present

## 2017-12-08 ENCOUNTER — Encounter (HOSPITAL_COMMUNITY)
Admission: RE | Admit: 2017-12-08 | Discharge: 2017-12-08 | Disposition: A | Discharge status: Home / Self Care | Payer: Self-pay | Source: Ambulatory Visit | Attending: Internal Medicine | Admitting: Internal Medicine

## 2017-12-10 ENCOUNTER — Encounter (HOSPITAL_COMMUNITY)
Admission: RE | Admit: 2017-12-10 | Discharge: 2017-12-10 | Disposition: A | Discharge status: Home / Self Care | Payer: Self-pay | Source: Ambulatory Visit | Attending: Internal Medicine | Admitting: Internal Medicine

## 2017-12-15 ENCOUNTER — Encounter (HOSPITAL_COMMUNITY)
Admission: RE | Admit: 2017-12-15 | Discharge: 2017-12-15 | Disposition: A | Discharge status: Home / Self Care | Payer: Self-pay | Source: Ambulatory Visit | Attending: Pulmonary Disease | Admitting: Pulmonary Disease

## 2017-12-15 DIAGNOSIS — F329 Major depressive disorder, single episode, unspecified: Secondary | ICD-10-CM | POA: Insufficient documentation

## 2017-12-15 DIAGNOSIS — J438 Other emphysema: Secondary | ICD-10-CM | POA: Insufficient documentation

## 2017-12-15 DIAGNOSIS — K449 Diaphragmatic hernia without obstruction or gangrene: Secondary | ICD-10-CM | POA: Insufficient documentation

## 2017-12-15 DIAGNOSIS — K224 Dyskinesia of esophagus: Secondary | ICD-10-CM | POA: Insufficient documentation

## 2017-12-15 DIAGNOSIS — J449 Chronic obstructive pulmonary disease, unspecified: Secondary | ICD-10-CM | POA: Insufficient documentation

## 2017-12-15 DIAGNOSIS — E7800 Pure hypercholesterolemia, unspecified: Secondary | ICD-10-CM | POA: Insufficient documentation

## 2017-12-15 DIAGNOSIS — I10 Essential (primary) hypertension: Secondary | ICD-10-CM | POA: Insufficient documentation

## 2017-12-17 ENCOUNTER — Encounter (HOSPITAL_COMMUNITY): Payer: Self-pay

## 2017-12-17 DIAGNOSIS — M5136 Other intervertebral disc degeneration, lumbar region: Secondary | ICD-10-CM | POA: Diagnosis not present

## 2017-12-22 ENCOUNTER — Encounter (HOSPITAL_COMMUNITY)
Admission: RE | Admit: 2017-12-22 | Discharge: 2017-12-22 | Disposition: A | Discharge status: Home / Self Care | Payer: Self-pay | Source: Ambulatory Visit | Attending: Pulmonary Disease | Admitting: Pulmonary Disease

## 2017-12-24 ENCOUNTER — Encounter (HOSPITAL_COMMUNITY)
Admission: RE | Admit: 2017-12-24 | Discharge: 2017-12-24 | Disposition: A | Discharge status: Home / Self Care | Payer: Self-pay | Source: Ambulatory Visit | Attending: Pulmonary Disease | Admitting: Pulmonary Disease

## 2017-12-29 ENCOUNTER — Encounter (HOSPITAL_COMMUNITY)
Admission: RE | Admit: 2017-12-29 | Discharge: 2017-12-29 | Disposition: A | Discharge status: Home / Self Care | Payer: Self-pay | Source: Ambulatory Visit | Attending: Internal Medicine | Admitting: Internal Medicine

## 2018-01-04 DIAGNOSIS — M5136 Other intervertebral disc degeneration, lumbar region: Secondary | ICD-10-CM | POA: Diagnosis not present

## 2018-01-05 ENCOUNTER — Encounter (HOSPITAL_COMMUNITY)
Admission: RE | Admit: 2018-01-05 | Discharge: 2018-01-05 | Disposition: A | Discharge status: Home / Self Care | Payer: Self-pay | Source: Ambulatory Visit | Attending: Pulmonary Disease | Admitting: Pulmonary Disease

## 2018-01-07 ENCOUNTER — Encounter (HOSPITAL_COMMUNITY)
Admission: RE | Admit: 2018-01-07 | Discharge: 2018-01-07 | Disposition: A | Discharge status: Home / Self Care | Payer: Self-pay | Source: Ambulatory Visit | Attending: Pulmonary Disease | Admitting: Pulmonary Disease

## 2018-01-12 ENCOUNTER — Encounter (HOSPITAL_COMMUNITY): Payer: Self-pay

## 2018-01-12 DIAGNOSIS — J449 Chronic obstructive pulmonary disease, unspecified: Secondary | ICD-10-CM | POA: Insufficient documentation

## 2018-01-12 DIAGNOSIS — K224 Dyskinesia of esophagus: Secondary | ICD-10-CM | POA: Insufficient documentation

## 2018-01-12 DIAGNOSIS — J438 Other emphysema: Secondary | ICD-10-CM | POA: Insufficient documentation

## 2018-01-12 DIAGNOSIS — K449 Diaphragmatic hernia without obstruction or gangrene: Secondary | ICD-10-CM | POA: Insufficient documentation

## 2018-01-12 DIAGNOSIS — I10 Essential (primary) hypertension: Secondary | ICD-10-CM | POA: Insufficient documentation

## 2018-01-12 DIAGNOSIS — E7800 Pure hypercholesterolemia, unspecified: Secondary | ICD-10-CM | POA: Insufficient documentation

## 2018-01-12 DIAGNOSIS — F329 Major depressive disorder, single episode, unspecified: Secondary | ICD-10-CM | POA: Insufficient documentation

## 2018-01-14 ENCOUNTER — Encounter (HOSPITAL_COMMUNITY)
Admission: RE | Admit: 2018-01-14 | Discharge: 2018-01-14 | Disposition: A | Discharge status: Home / Self Care | Payer: Medicare Other | Source: Ambulatory Visit | Attending: Pulmonary Disease | Admitting: Pulmonary Disease

## 2018-01-15 DIAGNOSIS — Z7982 Long term (current) use of aspirin: Secondary | ICD-10-CM | POA: Diagnosis not present

## 2018-01-15 DIAGNOSIS — I10 Essential (primary) hypertension: Secondary | ICD-10-CM | POA: Diagnosis not present

## 2018-01-15 DIAGNOSIS — E7800 Pure hypercholesterolemia, unspecified: Secondary | ICD-10-CM | POA: Diagnosis not present

## 2018-01-15 DIAGNOSIS — Z125 Encounter for screening for malignant neoplasm of prostate: Secondary | ICD-10-CM | POA: Diagnosis not present

## 2018-01-19 ENCOUNTER — Encounter (HOSPITAL_COMMUNITY)
Admission: RE | Admit: 2018-01-19 | Discharge: 2018-01-19 | Disposition: A | Discharge status: Home / Self Care | Payer: Self-pay | Source: Ambulatory Visit | Attending: Pulmonary Disease | Admitting: Pulmonary Disease

## 2018-01-19 DIAGNOSIS — Z23 Encounter for immunization: Secondary | ICD-10-CM | POA: Diagnosis not present

## 2018-01-19 DIAGNOSIS — M545 Low back pain: Secondary | ICD-10-CM | POA: Diagnosis not present

## 2018-01-19 DIAGNOSIS — Z7982 Long term (current) use of aspirin: Secondary | ICD-10-CM | POA: Diagnosis not present

## 2018-01-19 DIAGNOSIS — I10 Essential (primary) hypertension: Secondary | ICD-10-CM | POA: Diagnosis not present

## 2018-01-19 DIAGNOSIS — J449 Chronic obstructive pulmonary disease, unspecified: Secondary | ICD-10-CM | POA: Diagnosis not present

## 2018-01-19 DIAGNOSIS — Z0000 Encounter for general adult medical examination without abnormal findings: Secondary | ICD-10-CM | POA: Diagnosis not present

## 2018-01-19 DIAGNOSIS — E871 Hypo-osmolality and hyponatremia: Secondary | ICD-10-CM | POA: Diagnosis not present

## 2018-01-21 ENCOUNTER — Encounter (HOSPITAL_COMMUNITY)
Admission: RE | Admit: 2018-01-21 | Discharge: 2018-01-21 | Disposition: A | Discharge status: Home / Self Care | Payer: Self-pay | Source: Ambulatory Visit | Attending: Pulmonary Disease | Admitting: Pulmonary Disease

## 2018-01-26 ENCOUNTER — Encounter (HOSPITAL_COMMUNITY): Payer: Self-pay

## 2018-01-28 ENCOUNTER — Encounter (HOSPITAL_COMMUNITY): Payer: Self-pay

## 2018-02-02 ENCOUNTER — Encounter (HOSPITAL_COMMUNITY): Payer: Self-pay

## 2018-02-04 ENCOUNTER — Encounter (HOSPITAL_COMMUNITY): Payer: Self-pay

## 2018-02-09 ENCOUNTER — Encounter (HOSPITAL_COMMUNITY): Payer: Self-pay

## 2018-02-11 ENCOUNTER — Encounter (HOSPITAL_COMMUNITY): Payer: Self-pay

## 2018-02-11 DIAGNOSIS — M1990 Unspecified osteoarthritis, unspecified site: Secondary | ICD-10-CM | POA: Diagnosis not present

## 2018-02-11 DIAGNOSIS — I10 Essential (primary) hypertension: Secondary | ICD-10-CM | POA: Insufficient documentation

## 2018-02-11 DIAGNOSIS — K224 Dyskinesia of esophagus: Secondary | ICD-10-CM | POA: Insufficient documentation

## 2018-02-11 DIAGNOSIS — N189 Chronic kidney disease, unspecified: Secondary | ICD-10-CM | POA: Diagnosis not present

## 2018-02-11 DIAGNOSIS — M549 Dorsalgia, unspecified: Secondary | ICD-10-CM | POA: Diagnosis not present

## 2018-02-11 DIAGNOSIS — M79673 Pain in unspecified foot: Secondary | ICD-10-CM | POA: Diagnosis not present

## 2018-02-11 DIAGNOSIS — E7800 Pure hypercholesterolemia, unspecified: Secondary | ICD-10-CM | POA: Insufficient documentation

## 2018-02-11 DIAGNOSIS — K449 Diaphragmatic hernia without obstruction or gangrene: Secondary | ICD-10-CM | POA: Insufficient documentation

## 2018-02-11 DIAGNOSIS — F329 Major depressive disorder, single episode, unspecified: Secondary | ICD-10-CM | POA: Insufficient documentation

## 2018-02-11 DIAGNOSIS — J449 Chronic obstructive pulmonary disease, unspecified: Secondary | ICD-10-CM | POA: Insufficient documentation

## 2018-02-11 DIAGNOSIS — J438 Other emphysema: Secondary | ICD-10-CM | POA: Insufficient documentation

## 2018-02-16 ENCOUNTER — Encounter (HOSPITAL_COMMUNITY): Payer: Self-pay

## 2018-02-18 ENCOUNTER — Encounter (HOSPITAL_COMMUNITY): Payer: Self-pay

## 2018-02-23 ENCOUNTER — Encounter (HOSPITAL_COMMUNITY): Payer: Self-pay

## 2018-02-25 ENCOUNTER — Encounter (HOSPITAL_COMMUNITY)
Admission: RE | Admit: 2018-02-25 | Discharge: 2018-02-25 | Disposition: A | Discharge status: Home / Self Care | Payer: Medicare Other | Source: Ambulatory Visit | Attending: Pulmonary Disease | Admitting: Pulmonary Disease

## 2018-03-02 ENCOUNTER — Encounter (HOSPITAL_COMMUNITY)
Admission: RE | Admit: 2018-03-02 | Discharge: 2018-03-02 | Disposition: A | Discharge status: Home / Self Care | Payer: Self-pay | Source: Ambulatory Visit | Attending: Pulmonary Disease | Admitting: Pulmonary Disease

## 2018-03-04 ENCOUNTER — Encounter (HOSPITAL_COMMUNITY): Payer: Self-pay

## 2018-03-09 ENCOUNTER — Encounter (HOSPITAL_COMMUNITY): Admission: RE | Admit: 2018-03-09 | Payer: Self-pay | Source: Ambulatory Visit

## 2018-03-11 ENCOUNTER — Encounter (HOSPITAL_COMMUNITY): Payer: Self-pay

## 2018-03-11 ENCOUNTER — Telehealth (HOSPITAL_COMMUNITY): Payer: Self-pay | Admitting: *Deleted

## 2018-03-16 ENCOUNTER — Encounter (HOSPITAL_COMMUNITY)
Admission: RE | Admit: 2018-03-16 | Discharge: 2018-03-16 | Disposition: A | Discharge status: Home / Self Care | Payer: Self-pay | Source: Ambulatory Visit | Attending: Pulmonary Disease | Admitting: Pulmonary Disease

## 2018-03-16 DIAGNOSIS — J449 Chronic obstructive pulmonary disease, unspecified: Secondary | ICD-10-CM | POA: Insufficient documentation

## 2018-03-16 DIAGNOSIS — K224 Dyskinesia of esophagus: Secondary | ICD-10-CM | POA: Insufficient documentation

## 2018-03-16 DIAGNOSIS — F329 Major depressive disorder, single episode, unspecified: Secondary | ICD-10-CM | POA: Insufficient documentation

## 2018-03-16 DIAGNOSIS — K449 Diaphragmatic hernia without obstruction or gangrene: Secondary | ICD-10-CM | POA: Insufficient documentation

## 2018-03-16 DIAGNOSIS — E7800 Pure hypercholesterolemia, unspecified: Secondary | ICD-10-CM | POA: Insufficient documentation

## 2018-03-16 DIAGNOSIS — J438 Other emphysema: Secondary | ICD-10-CM | POA: Insufficient documentation

## 2018-03-16 DIAGNOSIS — I10 Essential (primary) hypertension: Secondary | ICD-10-CM | POA: Insufficient documentation

## 2018-03-18 ENCOUNTER — Encounter (HOSPITAL_COMMUNITY): Payer: Self-pay

## 2018-03-23 ENCOUNTER — Encounter (HOSPITAL_COMMUNITY): Payer: Self-pay

## 2018-03-25 ENCOUNTER — Encounter (HOSPITAL_COMMUNITY): Payer: Self-pay

## 2018-03-25 DIAGNOSIS — M545 Low back pain: Secondary | ICD-10-CM | POA: Diagnosis not present

## 2018-03-26 DIAGNOSIS — X32XXXD Exposure to sunlight, subsequent encounter: Secondary | ICD-10-CM | POA: Diagnosis not present

## 2018-03-26 DIAGNOSIS — L570 Actinic keratosis: Secondary | ICD-10-CM | POA: Diagnosis not present

## 2018-03-26 DIAGNOSIS — Z1283 Encounter for screening for malignant neoplasm of skin: Secondary | ICD-10-CM | POA: Diagnosis not present

## 2018-03-30 ENCOUNTER — Encounter (HOSPITAL_COMMUNITY): Payer: Self-pay

## 2018-04-01 ENCOUNTER — Encounter (HOSPITAL_COMMUNITY): Payer: Self-pay

## 2018-04-06 ENCOUNTER — Encounter (HOSPITAL_COMMUNITY): Payer: Self-pay

## 2018-04-08 ENCOUNTER — Encounter (HOSPITAL_COMMUNITY): Payer: Self-pay

## 2018-04-09 DIAGNOSIS — N183 Chronic kidney disease, stage 3 (moderate): Secondary | ICD-10-CM | POA: Diagnosis not present

## 2018-04-09 DIAGNOSIS — M545 Low back pain: Secondary | ICD-10-CM | POA: Diagnosis not present

## 2018-04-09 DIAGNOSIS — K219 Gastro-esophageal reflux disease without esophagitis: Secondary | ICD-10-CM | POA: Diagnosis not present

## 2018-04-13 ENCOUNTER — Encounter (HOSPITAL_COMMUNITY): Payer: Medicare Other | Attending: Pulmonary Disease

## 2018-04-13 DIAGNOSIS — E7800 Pure hypercholesterolemia, unspecified: Secondary | ICD-10-CM | POA: Insufficient documentation

## 2018-04-13 DIAGNOSIS — I10 Essential (primary) hypertension: Secondary | ICD-10-CM | POA: Insufficient documentation

## 2018-04-13 DIAGNOSIS — J449 Chronic obstructive pulmonary disease, unspecified: Secondary | ICD-10-CM | POA: Insufficient documentation

## 2018-04-13 DIAGNOSIS — K449 Diaphragmatic hernia without obstruction or gangrene: Secondary | ICD-10-CM | POA: Insufficient documentation

## 2018-04-13 DIAGNOSIS — J438 Other emphysema: Secondary | ICD-10-CM | POA: Insufficient documentation

## 2018-04-13 DIAGNOSIS — K224 Dyskinesia of esophagus: Secondary | ICD-10-CM | POA: Insufficient documentation

## 2018-04-13 DIAGNOSIS — F329 Major depressive disorder, single episode, unspecified: Secondary | ICD-10-CM | POA: Insufficient documentation

## 2018-04-20 ENCOUNTER — Encounter (HOSPITAL_COMMUNITY): Payer: Medicare Other

## 2018-04-22 ENCOUNTER — Encounter (HOSPITAL_COMMUNITY): Payer: Medicare Other

## 2018-04-27 ENCOUNTER — Encounter (HOSPITAL_COMMUNITY): Payer: Medicare Other

## 2018-04-27 ENCOUNTER — Encounter (HOSPITAL_COMMUNITY): Payer: Self-pay | Admitting: *Deleted

## 2018-04-27 NOTE — Progress Notes (Signed)
Mark Robertson states that he spoke with someone on the telephone and that he has decided to drop from the Pulmonary Maintenance program.He states that he may move back to [facility] to be close to his family. I encouraged him to continue to exercise. I let him know that if we could be of any further assistance to him to let us know.Marland Kitchen

## 2018-04-29 ENCOUNTER — Encounter (HOSPITAL_COMMUNITY): Payer: Medicare Other

## 2018-05-03 DIAGNOSIS — H16143 Punctate keratitis, bilateral: Secondary | ICD-10-CM | POA: Diagnosis not present

## 2018-05-04 ENCOUNTER — Encounter (HOSPITAL_COMMUNITY): Payer: Medicare Other

## 2018-05-06 ENCOUNTER — Encounter (HOSPITAL_COMMUNITY): Payer: Medicare Other

## 2018-05-11 ENCOUNTER — Encounter (HOSPITAL_COMMUNITY): Payer: Medicare Other

## 2018-05-13 ENCOUNTER — Encounter (HOSPITAL_COMMUNITY): Payer: Medicare Other

## 2018-05-18 ENCOUNTER — Encounter (HOSPITAL_COMMUNITY): Payer: Medicare Other

## 2018-05-18 DIAGNOSIS — M5136 Other intervertebral disc degeneration, lumbar region: Secondary | ICD-10-CM | POA: Diagnosis not present

## 2018-05-20 ENCOUNTER — Encounter (HOSPITAL_COMMUNITY): Payer: Medicare Other

## 2018-05-24 DIAGNOSIS — M5136 Other intervertebral disc degeneration, lumbar region: Secondary | ICD-10-CM | POA: Diagnosis not present

## 2018-05-25 ENCOUNTER — Encounter (HOSPITAL_COMMUNITY): Payer: Medicare Other

## 2018-05-27 ENCOUNTER — Encounter (HOSPITAL_COMMUNITY): Payer: Medicare Other

## 2018-06-01 ENCOUNTER — Encounter (HOSPITAL_COMMUNITY): Payer: Medicare Other

## 2018-06-02 DIAGNOSIS — M722 Plantar fascial fibromatosis: Secondary | ICD-10-CM | POA: Diagnosis not present

## 2018-06-02 DIAGNOSIS — M6702 Short Achilles tendon (acquired), left ankle: Secondary | ICD-10-CM | POA: Diagnosis not present

## 2018-06-03 ENCOUNTER — Encounter (HOSPITAL_COMMUNITY): Payer: Medicare Other

## 2018-06-08 ENCOUNTER — Encounter (HOSPITAL_COMMUNITY): Payer: Medicare Other

## 2018-06-09 DIAGNOSIS — M722 Plantar fascial fibromatosis: Secondary | ICD-10-CM | POA: Diagnosis not present

## 2018-06-10 ENCOUNTER — Encounter (HOSPITAL_COMMUNITY): Payer: Medicare Other

## 2018-06-11 DIAGNOSIS — M722 Plantar fascial fibromatosis: Secondary | ICD-10-CM | POA: Diagnosis not present

## 2018-06-15 ENCOUNTER — Encounter (HOSPITAL_COMMUNITY): Payer: Medicare Other

## 2018-06-15 DIAGNOSIS — M961 Postlaminectomy syndrome, not elsewhere classified: Secondary | ICD-10-CM | POA: Diagnosis not present

## 2018-06-15 DIAGNOSIS — M47816 Spondylosis without myelopathy or radiculopathy, lumbar region: Secondary | ICD-10-CM | POA: Diagnosis not present

## 2018-06-16 DIAGNOSIS — M722 Plantar fascial fibromatosis: Secondary | ICD-10-CM | POA: Diagnosis not present

## 2018-06-17 ENCOUNTER — Encounter (HOSPITAL_COMMUNITY): Payer: Medicare Other

## 2018-06-18 DIAGNOSIS — M722 Plantar fascial fibromatosis: Secondary | ICD-10-CM | POA: Diagnosis not present

## 2018-06-21 DIAGNOSIS — M722 Plantar fascial fibromatosis: Secondary | ICD-10-CM | POA: Diagnosis not present

## 2018-06-22 ENCOUNTER — Encounter (HOSPITAL_COMMUNITY): Payer: Medicare Other

## 2018-06-24 ENCOUNTER — Encounter (HOSPITAL_COMMUNITY): Payer: Medicare Other

## 2018-06-24 DIAGNOSIS — M25571 Pain in right ankle and joints of right foot: Secondary | ICD-10-CM | POA: Diagnosis not present

## 2018-06-28 DIAGNOSIS — M722 Plantar fascial fibromatosis: Secondary | ICD-10-CM | POA: Diagnosis not present

## 2018-06-29 ENCOUNTER — Encounter (HOSPITAL_COMMUNITY): Payer: Medicare Other

## 2018-07-01 ENCOUNTER — Encounter (HOSPITAL_COMMUNITY): Payer: Medicare Other

## 2018-07-01 DIAGNOSIS — M47816 Spondylosis without myelopathy or radiculopathy, lumbar region: Secondary | ICD-10-CM | POA: Diagnosis not present

## 2018-07-06 ENCOUNTER — Encounter (HOSPITAL_COMMUNITY): Payer: Medicare Other

## 2018-07-08 ENCOUNTER — Encounter (HOSPITAL_COMMUNITY): Payer: Medicare Other

## 2018-07-09 DIAGNOSIS — Z23 Encounter for immunization: Secondary | ICD-10-CM | POA: Diagnosis not present

## 2018-07-13 ENCOUNTER — Encounter (HOSPITAL_COMMUNITY): Payer: Medicare Other

## 2018-07-14 DIAGNOSIS — M25572 Pain in left ankle and joints of left foot: Secondary | ICD-10-CM | POA: Diagnosis not present

## 2018-07-14 DIAGNOSIS — M722 Plantar fascial fibromatosis: Secondary | ICD-10-CM | POA: Diagnosis not present

## 2018-07-14 DIAGNOSIS — M79672 Pain in left foot: Secondary | ICD-10-CM | POA: Diagnosis not present

## 2018-07-15 ENCOUNTER — Encounter (HOSPITAL_COMMUNITY): Payer: Medicare Other

## 2018-07-20 ENCOUNTER — Encounter (HOSPITAL_COMMUNITY): Payer: Medicare Other

## 2018-07-21 DIAGNOSIS — N183 Chronic kidney disease, stage 3 (moderate): Secondary | ICD-10-CM | POA: Diagnosis not present

## 2018-07-22 ENCOUNTER — Encounter (HOSPITAL_COMMUNITY): Payer: Medicare Other

## 2018-07-26 DIAGNOSIS — M722 Plantar fascial fibromatosis: Secondary | ICD-10-CM | POA: Diagnosis not present

## 2018-07-27 ENCOUNTER — Encounter (HOSPITAL_COMMUNITY): Payer: Medicare Other

## 2018-07-28 DIAGNOSIS — J449 Chronic obstructive pulmonary disease, unspecified: Secondary | ICD-10-CM | POA: Diagnosis not present

## 2018-07-28 DIAGNOSIS — N183 Chronic kidney disease, stage 3 (moderate): Secondary | ICD-10-CM | POA: Diagnosis not present

## 2018-07-28 DIAGNOSIS — I10 Essential (primary) hypertension: Secondary | ICD-10-CM | POA: Diagnosis not present

## 2018-07-29 ENCOUNTER — Encounter (HOSPITAL_COMMUNITY): Payer: Medicare Other

## 2018-08-03 ENCOUNTER — Encounter (HOSPITAL_COMMUNITY): Payer: Medicare Other

## 2018-08-03 ENCOUNTER — Other Ambulatory Visit: Payer: Self-pay

## 2018-08-03 DIAGNOSIS — H04123 Dry eye syndrome of bilateral lacrimal glands: Secondary | ICD-10-CM | POA: Diagnosis not present

## 2018-08-03 NOTE — Patient Outreach (Signed)
[facility]) Care Management  08/03/2018  SAYRE WITHERINGTON [birth year] [id number]   Medication Adherence call to Mr. Elvie Maines left a message for patient to call back patient is due on Rosuvastatin 20 mg. Mrs. Vanterpool is showing past due under [place].   [place] Management Direct Dial [phone]  Fax [phone] [email or web address]

## 2018-08-04 DIAGNOSIS — M722 Plantar fascial fibromatosis: Secondary | ICD-10-CM | POA: Diagnosis not present

## 2018-08-04 DIAGNOSIS — M79672 Pain in left foot: Secondary | ICD-10-CM | POA: Diagnosis not present

## 2018-08-19 DIAGNOSIS — M47816 Spondylosis without myelopathy or radiculopathy, lumbar region: Secondary | ICD-10-CM | POA: Diagnosis not present

## 2018-09-13 DIAGNOSIS — M47816 Spondylosis without myelopathy or radiculopathy, lumbar region: Secondary | ICD-10-CM | POA: Diagnosis not present

## 2018-09-13 DIAGNOSIS — M5136 Other intervertebral disc degeneration, lumbar region: Secondary | ICD-10-CM | POA: Diagnosis not present

## 2018-09-13 DIAGNOSIS — M961 Postlaminectomy syndrome, not elsewhere classified: Secondary | ICD-10-CM | POA: Diagnosis not present

## 2018-10-19 DIAGNOSIS — M961 Postlaminectomy syndrome, not elsewhere classified: Secondary | ICD-10-CM | POA: Diagnosis not present

## 2018-10-19 DIAGNOSIS — M5136 Other intervertebral disc degeneration, lumbar region: Secondary | ICD-10-CM | POA: Diagnosis not present

## 2018-10-20 DIAGNOSIS — H26493 Other secondary cataract, bilateral: Secondary | ICD-10-CM | POA: Diagnosis not present

## 2018-10-20 DIAGNOSIS — H04123 Dry eye syndrome of bilateral lacrimal glands: Secondary | ICD-10-CM | POA: Diagnosis not present

## 2018-10-20 DIAGNOSIS — H35033 Hypertensive retinopathy, bilateral: Secondary | ICD-10-CM | POA: Diagnosis not present

## 2018-11-09 DIAGNOSIS — M961 Postlaminectomy syndrome, not elsewhere classified: Secondary | ICD-10-CM | POA: Diagnosis not present

## 2018-11-09 DIAGNOSIS — M5136 Other intervertebral disc degeneration, lumbar region: Secondary | ICD-10-CM | POA: Diagnosis not present

## 2018-11-24 DIAGNOSIS — M5416 Radiculopathy, lumbar region: Secondary | ICD-10-CM | POA: Diagnosis not present

## 2018-11-24 DIAGNOSIS — M25551 Pain in right hip: Secondary | ICD-10-CM | POA: Diagnosis not present

## 2018-11-24 DIAGNOSIS — M961 Postlaminectomy syndrome, not elsewhere classified: Secondary | ICD-10-CM | POA: Diagnosis not present

## 2018-12-28 DIAGNOSIS — M961 Postlaminectomy syndrome, not elsewhere classified: Secondary | ICD-10-CM | POA: Diagnosis not present

## 2018-12-28 DIAGNOSIS — M5136 Other intervertebral disc degeneration, lumbar region: Secondary | ICD-10-CM | POA: Diagnosis not present

## 2019-01-19 DIAGNOSIS — I10 Essential (primary) hypertension: Secondary | ICD-10-CM | POA: Diagnosis not present

## 2019-01-19 DIAGNOSIS — Z7982 Long term (current) use of aspirin: Secondary | ICD-10-CM | POA: Diagnosis not present

## 2019-01-19 DIAGNOSIS — E7800 Pure hypercholesterolemia, unspecified: Secondary | ICD-10-CM | POA: Diagnosis not present

## 2019-01-24 DIAGNOSIS — D179 Benign lipomatous neoplasm, unspecified: Secondary | ICD-10-CM | POA: Diagnosis not present

## 2019-01-24 DIAGNOSIS — Z7982 Long term (current) use of aspirin: Secondary | ICD-10-CM | POA: Diagnosis not present

## 2019-01-24 DIAGNOSIS — G4733 Obstructive sleep apnea (adult) (pediatric): Secondary | ICD-10-CM | POA: Diagnosis not present

## 2019-01-24 DIAGNOSIS — M545 Low back pain: Secondary | ICD-10-CM | POA: Diagnosis not present

## 2019-01-24 DIAGNOSIS — Z0000 Encounter for general adult medical examination without abnormal findings: Secondary | ICD-10-CM | POA: Diagnosis not present

## 2019-01-24 DIAGNOSIS — R9389 Abnormal findings on diagnostic imaging of other specified body structures: Secondary | ICD-10-CM | POA: Diagnosis not present

## 2019-01-24 DIAGNOSIS — I10 Essential (primary) hypertension: Secondary | ICD-10-CM | POA: Diagnosis not present

## 2019-02-24 DIAGNOSIS — M48061 Spinal stenosis, lumbar region without neurogenic claudication: Secondary | ICD-10-CM | POA: Diagnosis not present

## 2019-03-11 ENCOUNTER — Other Ambulatory Visit: Payer: Self-pay | Admitting: Neurological Surgery

## 2019-03-21 DIAGNOSIS — H1045 Other chronic allergic conjunctivitis: Secondary | ICD-10-CM | POA: Diagnosis not present

## 2019-03-22 NOTE — Progress Notes (Signed)
[id number] [place], [place] - [address] [place] [address] Phone: [phone] Fax: [phone]      Your procedure is scheduled on Friday, 03/25/19.  Report to [facility] Main Entrance "A" at 74 Noon, and check in at the Admitting office.  Call this number if you have problems the morning of surgery:  [phone]  Call [phone] if you have any questions prior to your surgery date Monday-Friday 8am-4pm    Remember:  Do not eat or drink after midnight Thursday   Take these medicines the morning of surgery with A SIP OF WATER  Acetaminophen BREO ELLIPTA-ok to use gabapentin (NEURONTIN) omeprazole (PRILOSEC)  Polyethyl Glycol-Propyl Glycol (SYSTANE) - ok to use   STOP now taking any Aspirin (unless otherwise instructed by your surgeon), Aleve, Naproxen, Ibuprofen, Motrin, Advil, Goody's, BC's, all herbal medications, fish oil, and all vitamins.    The Morning of Surgery  Do not wear jewelry  Do not wear lotions, powders, colognes, or deodorant  Do not shave 48 hours prior to surgery.  Men may shave face and neck.  Do not bring valuables to the hospital.  [facility] is not responsible for any belongings or valuables.  If you are a smoker, DO NOT Smoke 24 hours prior to surgery IF you wear a CPAP at night please bring your mask, tubing, and machine the morning of surgery   Remember that you must have someone to transport you home after your surgery, and remain with you for 24 hours if you are discharged the same day.   Contacts, glasses, hearing aids, dentures or bridgework may not be worn into surgery.   For patients admitted to the hospital, discharge time will be determined by your treatment team.  Patients discharged the day of surgery will not be allowed to drive home.    Special instructions:   [place]- Preparing For Surgery  Before surgery, you can play an important role.  Because skin is not sterile, your skin needs to be as free of germs as possible. You can reduce the number of germs on your skin by washing with CHG (chlorahexidine gluconate) Soap before surgery.  CHG is an antiseptic cleaner which kills germs and bonds with the skin to continue killing germs even after washing.    Oral Hygiene is also important to reduce your risk of infection.  Remember - BRUSH YOUR TEETH THE MORNING OF SURGERY WITH YOUR REGULAR TOOTHPASTE  Please do not use if you have an allergy to CHG or antibacterial soaps. If your skin becomes reddened/irritated stop using the CHG.  Do not shave (including legs and underarms) for at least 48 hours prior to first CHG shower. It is OK to shave your face.  Please follow these instructions carefully.   1. Shower the [employer] BEFORE SURGERY (Thurs) and the MORNING OF SURGERY(Fri) with CHG Soap.   2. If you chose to wash your hair, wash your hair first as usual with your normal shampoo.  3. After you shampoo, rinse your hair and body thoroughly to remove the shampoo.  4. Use CHG as you would any other liquid soap. You can apply CHG directly to the skin and wash gently with a scrungie or a clean washcloth.   5. Apply the CHG Soap to your body ONLY FROM THE NECK DOWN.  Do not use on open wounds or open sores. Avoid contact with your eyes, ears, mouth and  genitals (private parts). Wash Face and genitals (private parts)  with your normal soap.   6. Wash thoroughly, paying special attention to the area where your surgery will be performed.  7. Thoroughly rinse your body with warm water from the neck down.  8. DO NOT shower/wash with your normal soap after using and rinsing off the CHG Soap.  9. Pat yourself dry with a CLEAN TOWEL.  10. Wear CLEAN PAJAMAS to bed the night before surgery, wear comfortable clothes the morning of surgery  11. Place CLEAN SHEETS on your bed the night of your first shower and DO NOT SLEEP WITH PETS.    Day of  Surgery:  Do not apply any deodorants/lotions.  Please wear clean clothes to the hospital/surgery center.   Remember to brush your teeth WITH YOUR REGULAR TOOTHPASTE.   Please read over the following fact sheets that you were given.

## 2019-03-23 ENCOUNTER — Encounter (HOSPITAL_COMMUNITY)
Admission: RE | Admit: 2019-03-23 | Discharge: 2019-03-23 | Disposition: A | Discharge status: Home / Self Care | Payer: Medicare Other | Source: Ambulatory Visit | Attending: Neurological Surgery | Admitting: Neurological Surgery

## 2019-03-23 ENCOUNTER — Encounter (HOSPITAL_COMMUNITY): Payer: Self-pay

## 2019-03-23 ENCOUNTER — Other Ambulatory Visit: Payer: Self-pay

## 2019-03-23 ENCOUNTER — Other Ambulatory Visit (HOSPITAL_COMMUNITY)
Admission: RE | Admit: 2019-03-23 | Discharge: 2019-03-23 | Disposition: A | Discharge status: Home / Self Care | Payer: Medicare Other | Source: Ambulatory Visit | Attending: Neurological Surgery | Admitting: Neurological Surgery

## 2019-03-23 DIAGNOSIS — J449 Chronic obstructive pulmonary disease, unspecified: Secondary | ICD-10-CM | POA: Diagnosis not present

## 2019-03-23 DIAGNOSIS — Z8249 Family history of ischemic heart disease and other diseases of the circulatory system: Secondary | ICD-10-CM | POA: Diagnosis not present

## 2019-03-23 DIAGNOSIS — I10 Essential (primary) hypertension: Secondary | ICD-10-CM | POA: Diagnosis not present

## 2019-03-23 DIAGNOSIS — M1990 Unspecified osteoarthritis, unspecified site: Secondary | ICD-10-CM | POA: Diagnosis not present

## 2019-03-23 DIAGNOSIS — M79604 Pain in right leg: Secondary | ICD-10-CM | POA: Diagnosis not present

## 2019-03-23 DIAGNOSIS — E7800 Pure hypercholesterolemia, unspecified: Secondary | ICD-10-CM | POA: Diagnosis not present

## 2019-03-23 DIAGNOSIS — Z1159 Encounter for screening for other viral diseases: Secondary | ICD-10-CM | POA: Insufficient documentation

## 2019-03-23 DIAGNOSIS — Z79899 Other long term (current) drug therapy: Secondary | ICD-10-CM | POA: Diagnosis not present

## 2019-03-23 DIAGNOSIS — M79605 Pain in left leg: Secondary | ICD-10-CM | POA: Diagnosis not present

## 2019-03-23 DIAGNOSIS — Z87891 Personal history of nicotine dependence: Secondary | ICD-10-CM | POA: Diagnosis not present

## 2019-03-23 DIAGNOSIS — F329 Major depressive disorder, single episode, unspecified: Secondary | ICD-10-CM | POA: Diagnosis not present

## 2019-03-23 DIAGNOSIS — Z7951 Long term (current) use of inhaled steroids: Secondary | ICD-10-CM | POA: Diagnosis not present

## 2019-03-23 DIAGNOSIS — M48061 Spinal stenosis, lumbar region without neurogenic claudication: Secondary | ICD-10-CM | POA: Diagnosis not present

## 2019-03-23 DIAGNOSIS — Z96653 Presence of artificial knee joint, bilateral: Secondary | ICD-10-CM | POA: Diagnosis not present

## 2019-03-23 DIAGNOSIS — Z7982 Long term (current) use of aspirin: Secondary | ICD-10-CM | POA: Diagnosis not present

## 2019-03-23 DIAGNOSIS — Z01812 Encounter for preprocedural laboratory examination: Secondary | ICD-10-CM | POA: Insufficient documentation

## 2019-03-23 HISTORY — DX: Unspecified osteoarthritis, unspecified site: M19.90

## 2019-03-23 LAB — CBC WITH DIFFERENTIAL/PLATELET
Abs Immature Granulocytes: 0.01 10*3/uL (ref 0.00–0.07)
Basophils Absolute: 0 10*3/uL (ref 0.0–0.1)
Basophils Relative: 0 %
Eosinophils Absolute: 0.2 10*3/uL (ref 0.0–0.5)
Eosinophils Relative: 3 %
HCT: 43 % (ref 39.0–52.0)
Hemoglobin: 13.9 g/dL (ref 13.0–17.0)
Immature Granulocytes: 0 %
Lymphocytes Relative: 22 %
Lymphs Abs: 1.2 10*3/uL (ref 0.7–4.0)
MCH: 31.1 pg (ref 26.0–34.0)
MCHC: 32.3 g/dL (ref 30.0–36.0)
MCV: 96.2 fL (ref 80.0–100.0)
Monocytes Absolute: 0.8 10*3/uL (ref 0.1–1.0)
Monocytes Relative: 15 %
Neutro Abs: 3.2 10*3/uL (ref 1.7–7.7)
Neutrophils Relative %: 60 %
Platelets: 153 10*3/uL (ref 150–400)
RBC: 4.47 MIL/uL (ref 4.22–5.81)
RDW: 12.4 % (ref 11.5–15.5)
WBC: 5.4 10*3/uL (ref 4.0–10.5)
nRBC: 0 % (ref 0.0–0.2)

## 2019-03-23 LAB — BASIC METABOLIC PANEL
Anion gap: 9 (ref 5–15)
BUN: 18 mg/dL (ref 8–23)
CO2: 28 mmol/L (ref 22–32)
Calcium: 9.6 mg/dL (ref 8.9–10.3)
Chloride: 102 mmol/L (ref 98–111)
Creatinine, Ser: 1.49 mg/dL — ABNORMAL HIGH (ref 0.61–1.24)
GFR calc Af Amer: 51 mL/min — ABNORMAL LOW (ref >60)
GFR calc non Af Amer: 44 mL/min — ABNORMAL LOW (ref >60)
Glucose, Bld: 95 mg/dL (ref 70–99)
Potassium: 4.1 mmol/L (ref 3.5–5.1)
Sodium: 139 mmol/L (ref 135–145)

## 2019-03-23 LAB — SURGICAL PCR SCREEN
MRSA, PCR: NEGATIVE
Staphylococcus aureus: NEGATIVE

## 2019-03-23 LAB — PROTIME-INR
INR: 1 (ref 0.8–1.2)
Prothrombin Time: 13.2 seconds (ref 11.4–15.2)

## 2019-03-23 NOTE — Progress Notes (Signed)
PCP - Del[place] Pretty Cardiologist - na  Chest x-ray -  01/2019 EKG - 01/2019 Stress Test - greater 3 yrs--normal ECHO - na Cardiac Cath - na  Sleep Study -  na CPAP - na  Fasting Blood Sugar - na Checks Blood Sugar _____ times a day  Blood Thinner Instructions: Aspirin Instructions:stopped  03/19/19  Anesthesia review:   Patient denies shortness of breath, fever, cough and chest pain at PAT appointment   Patient verbalized understanding of instructions that were given to them at the PAT appointment. Patient was also instructed that they will need to review over the PAT instructions again at home before surgery.

## 2019-03-23 NOTE — Progress Notes (Signed)
   03/23/19 0917  OBSTRUCTIVE SLEEP APNEA  Have you ever been diagnosed with sleep apnea through a sleep study? No  Do you snore loudly (loud enough to be heard through closed doors)?  0  Do you often feel tired, fatigued, or sleepy during the daytime (such as falling asleep during driving or talking to someone)? 0  Has anyone observed you stop breathing during your sleep? 0  Do you have, or are you being treated for high blood pressure? 1  BMI more than 35 kg/m2? 1  Age > 50 (1-yes) 1  Neck circumference greater than:Male 16 inches or larger, Male 17inches or larger? 1  Male Gender (Yes=1) 1  Obstructive Sleep Apnea Score 5  Score 5 or greater  Results sent to PCP

## 2019-03-24 LAB — NOVEL CORONAVIRUS, NAA (HOSP ORDER, SEND-OUT TO REF LAB; TAT 18-24 HRS): SARS-CoV-2, NAA: NOT DETECTED

## 2019-03-25 ENCOUNTER — Encounter (HOSPITAL_COMMUNITY)
Admission: RE | Disposition: A | Discharge status: Home / Self Care | Payer: Self-pay | Source: Home / Self Care | Attending: Neurological Surgery

## 2019-03-25 ENCOUNTER — Ambulatory Visit (HOSPITAL_COMMUNITY): Payer: Medicare Other

## 2019-03-25 ENCOUNTER — Ambulatory Visit (HOSPITAL_COMMUNITY)
Admission: RE | Admit: 2019-03-25 | Discharge: 2019-03-26 | Disposition: A | Discharge status: Home / Self Care | Payer: Medicare Other | Attending: Neurological Surgery | Admitting: Neurological Surgery

## 2019-03-25 ENCOUNTER — Other Ambulatory Visit: Payer: Self-pay

## 2019-03-25 ENCOUNTER — Ambulatory Visit (HOSPITAL_COMMUNITY): Payer: Medicare Other | Admitting: Anesthesiology

## 2019-03-25 ENCOUNTER — Encounter (HOSPITAL_COMMUNITY): Payer: Self-pay | Admitting: Neurological Surgery

## 2019-03-25 DIAGNOSIS — I10 Essential (primary) hypertension: Secondary | ICD-10-CM | POA: Diagnosis not present

## 2019-03-25 DIAGNOSIS — M79604 Pain in right leg: Secondary | ICD-10-CM | POA: Diagnosis not present

## 2019-03-25 DIAGNOSIS — Z8249 Family history of ischemic heart disease and other diseases of the circulatory system: Secondary | ICD-10-CM | POA: Diagnosis not present

## 2019-03-25 DIAGNOSIS — F329 Major depressive disorder, single episode, unspecified: Secondary | ICD-10-CM | POA: Insufficient documentation

## 2019-03-25 DIAGNOSIS — Z79899 Other long term (current) drug therapy: Secondary | ICD-10-CM | POA: Insufficient documentation

## 2019-03-25 DIAGNOSIS — J449 Chronic obstructive pulmonary disease, unspecified: Secondary | ICD-10-CM | POA: Insufficient documentation

## 2019-03-25 DIAGNOSIS — E7800 Pure hypercholesterolemia, unspecified: Secondary | ICD-10-CM | POA: Insufficient documentation

## 2019-03-25 DIAGNOSIS — M48061 Spinal stenosis, lumbar region without neurogenic claudication: Secondary | ICD-10-CM | POA: Diagnosis not present

## 2019-03-25 DIAGNOSIS — E785 Hyperlipidemia, unspecified: Secondary | ICD-10-CM | POA: Diagnosis not present

## 2019-03-25 DIAGNOSIS — Z7982 Long term (current) use of aspirin: Secondary | ICD-10-CM | POA: Diagnosis not present

## 2019-03-25 DIAGNOSIS — M79605 Pain in left leg: Secondary | ICD-10-CM | POA: Diagnosis not present

## 2019-03-25 DIAGNOSIS — Z419 Encounter for procedure for purposes other than remedying health state, unspecified: Secondary | ICD-10-CM

## 2019-03-25 DIAGNOSIS — Z96653 Presence of artificial knee joint, bilateral: Secondary | ICD-10-CM | POA: Insufficient documentation

## 2019-03-25 DIAGNOSIS — Z1159 Encounter for screening for other viral diseases: Secondary | ICD-10-CM | POA: Insufficient documentation

## 2019-03-25 DIAGNOSIS — Z981 Arthrodesis status: Secondary | ICD-10-CM | POA: Diagnosis not present

## 2019-03-25 DIAGNOSIS — Z87891 Personal history of nicotine dependence: Secondary | ICD-10-CM | POA: Insufficient documentation

## 2019-03-25 DIAGNOSIS — M1990 Unspecified osteoarthritis, unspecified site: Secondary | ICD-10-CM | POA: Insufficient documentation

## 2019-03-25 DIAGNOSIS — Z7951 Long term (current) use of inhaled steroids: Secondary | ICD-10-CM | POA: Insufficient documentation

## 2019-03-25 DIAGNOSIS — Z9889 Other specified postprocedural states: Secondary | ICD-10-CM

## 2019-03-25 HISTORY — PX: LUMBAR LAMINECTOMY/DECOMPRESSION MICRODISCECTOMY: SHX5026

## 2019-03-25 SURGERY — LUMBAR LAMINECTOMY/DECOMPRESSION MICRODISCECTOMY 2 LEVELS
Anesthesia: General | Site: Back

## 2019-03-25 MED ORDER — FENTANYL CITRATE (PF) 100 MCG/2ML IJ SOLN
INTRAMUSCULAR | Status: DC | PRN
  Administered 2019-03-25: 100 ug via INTRAVENOUS

## 2019-03-25 MED ORDER — CEFAZOLIN SODIUM-DEXTROSE 2-4 GM/100ML-% IV SOLN
2.0000 g | INTRAVENOUS | Status: AC
  Administered 2019-03-25: 14:00:00 2 g via INTRAVENOUS
  Filled 2019-03-25: qty 100

## 2019-03-25 MED ORDER — SODIUM CHLORIDE 0.9 % IV SOLN: 250.0000 mL | INTRAVENOUS | Status: DC

## 2019-03-25 MED ORDER — SODIUM CHLORIDE 0.9 % IV SOLN
INTRAVENOUS | Status: DC | PRN
  Administered 2019-03-25: 500 mL

## 2019-03-25 MED ORDER — METHOCARBAMOL 1000 MG/10ML IJ SOLN
500.0000 mg | Freq: Four times a day (QID) | INTRAVENOUS | Status: DC | PRN
  Filled 2019-03-25: qty 5

## 2019-03-25 MED ORDER — MIDAZOLAM HCL 2 MG/2ML IJ SOLN
INTRAMUSCULAR | Status: AC
  Filled 2019-03-25: qty 2

## 2019-03-25 MED ORDER — HYDROCODONE-ACETAMINOPHEN 7.5-325 MG PO TABS
1.0000 | ORAL_TABLET | Freq: Four times a day (QID) | ORAL | Status: DC
  Administered 2019-03-25 – 2019-03-26 (×3): 1 via ORAL
  Filled 2019-03-25 (×3): qty 1

## 2019-03-25 MED ORDER — SODIUM CHLORIDE 0.9% FLUSH: 3.0000 mL | INTRAVENOUS | Status: DC | PRN

## 2019-03-25 MED ORDER — CELECOXIB 200 MG PO CAPS
200.0000 mg | ORAL_CAPSULE | Freq: Two times a day (BID) | ORAL | Status: DC
  Administered 2019-03-25 – 2019-03-26 (×2): 200 mg via ORAL
  Filled 2019-03-25 (×2): qty 1

## 2019-03-25 MED ORDER — METHOCARBAMOL 500 MG PO TABS
ORAL_TABLET | ORAL | Status: AC
  Filled 2019-03-25: qty 1

## 2019-03-25 MED ORDER — SENNA 8.6 MG PO TABS
1.0000 | ORAL_TABLET | Freq: Two times a day (BID) | ORAL | Status: DC
  Administered 2019-03-25 – 2019-03-26 (×2): 8.6 mg via ORAL
  Filled 2019-03-25 (×2): qty 1

## 2019-03-25 MED ORDER — PHENYLEPHRINE HCL (PRESSORS) 10 MG/ML IV SOLN
INTRAVENOUS | Status: DC | PRN
  Administered 2019-03-25: 120 ug via INTRAVENOUS
  Administered 2019-03-25: 80 ug via INTRAVENOUS

## 2019-03-25 MED ORDER — MENTHOL 3 MG MT LOZG: 1.0000 | LOZENGE | OROMUCOSAL | Status: DC | PRN

## 2019-03-25 MED ORDER — ONDANSETRON HCL 4 MG/2ML IJ SOLN
INTRAMUSCULAR | Status: DC | PRN
  Administered 2019-03-25: 4 mg via INTRAVENOUS

## 2019-03-25 MED ORDER — BUPIVACAINE HCL (PF) 0.25 % IJ SOLN
INTRAMUSCULAR | Status: AC
  Filled 2019-03-25: qty 30

## 2019-03-25 MED ORDER — IRBESARTAN 150 MG PO TABS
150.0000 mg | ORAL_TABLET | Freq: Every day | ORAL | Status: DC
  Administered 2019-03-26: 150 mg via ORAL
  Filled 2019-03-25: qty 1

## 2019-03-25 MED ORDER — 0.9 % SODIUM CHLORIDE (POUR BTL) OPTIME
TOPICAL | Status: DC | PRN
  Administered 2019-03-25: 1000 mL

## 2019-03-25 MED ORDER — DEXAMETHASONE SODIUM PHOSPHATE 10 MG/ML IJ SOLN
INTRAMUSCULAR | Status: DC | PRN
  Administered 2019-03-25: 10 mg via INTRAVENOUS

## 2019-03-25 MED ORDER — PHENOL 1.4 % MT LIQD: 1.0000 | OROMUCOSAL | Status: DC | PRN

## 2019-03-25 MED ORDER — OXYCODONE HCL 5 MG PO TABS
ORAL_TABLET | ORAL | Status: AC
  Filled 2019-03-25: qty 1

## 2019-03-25 MED ORDER — ACETAMINOPHEN 650 MG RE SUPP: 650.0000 mg | RECTAL | Status: DC | PRN

## 2019-03-25 MED ORDER — POTASSIUM CHLORIDE IN NACL 20-0.9 MEQ/L-% IV SOLN: INTRAVENOUS | Status: DC

## 2019-03-25 MED ORDER — ONDANSETRON HCL 4 MG PO TABS: 4.0000 mg | ORAL_TABLET | Freq: Four times a day (QID) | ORAL | Status: DC | PRN

## 2019-03-25 MED ORDER — BUPIVACAINE HCL (PF) 0.25 % IJ SOLN
INTRAMUSCULAR | Status: DC | PRN
  Administered 2019-03-25: 10 mL

## 2019-03-25 MED ORDER — SODIUM CHLORIDE 0.9% FLUSH
3.0000 mL | Freq: Two times a day (BID) | INTRAVENOUS | Status: DC
  Administered 2019-03-25: 3 mL via INTRAVENOUS

## 2019-03-25 MED ORDER — OLMESARTAN MEDOXOMIL-HCTZ 20-12.5 MG PO TABS: 1.0000 | ORAL_TABLET | Freq: Every day | ORAL | Status: DC

## 2019-03-25 MED ORDER — FLUTICASONE FUROATE-VILANTEROL 100-25 MCG/INH IN AEPB
1.0000 | INHALATION_SPRAY | Freq: Every day | RESPIRATORY_TRACT | Status: DC
  Filled 2019-03-25: qty 28

## 2019-03-25 MED ORDER — ONDANSETRON HCL 4 MG/2ML IJ SOLN: 4.0000 mg | Freq: Once | INTRAMUSCULAR | Status: DC | PRN

## 2019-03-25 MED ORDER — PROPOFOL 10 MG/ML IV BOLUS
INTRAVENOUS | Status: DC | PRN
  Administered 2019-03-25: 200 mg via INTRAVENOUS

## 2019-03-25 MED ORDER — CHLORHEXIDINE GLUCONATE CLOTH 2 % EX PADS: 6.0000 | MEDICATED_PAD | Freq: Once | CUTANEOUS | Status: DC

## 2019-03-25 MED ORDER — PANTOPRAZOLE SODIUM 40 MG PO TBEC
40.0000 mg | DELAYED_RELEASE_TABLET | Freq: Every day | ORAL | Status: DC
  Administered 2019-03-26: 40 mg via ORAL
  Filled 2019-03-25: qty 1

## 2019-03-25 MED ORDER — FENTANYL CITRATE (PF) 250 MCG/5ML IJ SOLN
INTRAMUSCULAR | Status: AC
  Filled 2019-03-25: qty 5

## 2019-03-25 MED ORDER — HYDROCHLOROTHIAZIDE 12.5 MG PO CAPS
12.5000 mg | ORAL_CAPSULE | Freq: Every day | ORAL | Status: DC
  Administered 2019-03-26: 12.5 mg via ORAL
  Filled 2019-03-25: qty 1

## 2019-03-25 MED ORDER — ACETAMINOPHEN 325 MG PO TABS: 650.0000 mg | ORAL_TABLET | ORAL | Status: DC | PRN

## 2019-03-25 MED ORDER — ASPIRIN EC 81 MG PO TBEC
81.0000 mg | DELAYED_RELEASE_TABLET | Freq: Every evening | ORAL | Status: DC
  Administered 2019-03-25: 81 mg via ORAL
  Filled 2019-03-25: qty 1

## 2019-03-25 MED ORDER — EPHEDRINE SULFATE 50 MG/ML IJ SOLN
INTRAMUSCULAR | Status: DC | PRN
  Administered 2019-03-25: 10 mg via INTRAVENOUS
  Administered 2019-03-25: 5 mg via INTRAVENOUS
  Administered 2019-03-25: 10 mg via INTRAVENOUS
  Administered 2019-03-25: 5 mg via INTRAVENOUS

## 2019-03-25 MED ORDER — LACTATED RINGERS IV SOLN
INTRAVENOUS | Status: DC
  Administered 2019-03-25: 13:00:00 via INTRAVENOUS

## 2019-03-25 MED ORDER — METHOCARBAMOL 500 MG PO TABS
500.0000 mg | ORAL_TABLET | Freq: Four times a day (QID) | ORAL | Status: DC | PRN
  Administered 2019-03-25 – 2019-03-26 (×2): 500 mg via ORAL
  Filled 2019-03-25: qty 1

## 2019-03-25 MED ORDER — LIDOCAINE 2% (20 MG/ML) 5 ML SYRINGE
INTRAMUSCULAR | Status: DC | PRN
  Administered 2019-03-25: 100 mg via INTRAVENOUS

## 2019-03-25 MED ORDER — SUCCINYLCHOLINE CHLORIDE 20 MG/ML IJ SOLN
INTRAMUSCULAR | Status: DC | PRN
  Administered 2019-03-25: 120 mg via INTRAVENOUS

## 2019-03-25 MED ORDER — SUGAMMADEX SODIUM 200 MG/2ML IV SOLN
INTRAVENOUS | Status: DC | PRN
  Administered 2019-03-25: 200 mg via INTRAVENOUS

## 2019-03-25 MED ORDER — CEFAZOLIN SODIUM-DEXTROSE 2-4 GM/100ML-% IV SOLN
2.0000 g | Freq: Three times a day (TID) | INTRAVENOUS | Status: AC
  Administered 2019-03-25 – 2019-03-26 (×2): 2 g via INTRAVENOUS
  Filled 2019-03-25 (×2): qty 100

## 2019-03-25 MED ORDER — THROMBIN 5000 UNITS EX SOLR
CUTANEOUS | Status: AC
  Filled 2019-03-25: qty 15000

## 2019-03-25 MED ORDER — MORPHINE SULFATE (PF) 2 MG/ML IV SOLN: 2.0000 mg | INTRAVENOUS | Status: DC | PRN

## 2019-03-25 MED ORDER — OXYCODONE HCL 5 MG PO TABS
5.0000 mg | ORAL_TABLET | Freq: Once | ORAL | Status: AC | PRN
  Administered 2019-03-25: 5 mg via ORAL

## 2019-03-25 MED ORDER — THROMBIN 5000 UNITS EX SOLR
OROMUCOSAL | Status: DC | PRN
  Administered 2019-03-25: 5 mL via TOPICAL

## 2019-03-25 MED ORDER — HYDROCODONE-ACETAMINOPHEN 7.5-325 MG PO TABS
1.0000 | ORAL_TABLET | ORAL | Status: DC | PRN
  Administered 2019-03-26: 1 via ORAL
  Filled 2019-03-25: qty 1

## 2019-03-25 MED ORDER — DEXAMETHASONE SODIUM PHOSPHATE 10 MG/ML IJ SOLN: 10.0000 mg | INTRAMUSCULAR | Status: DC

## 2019-03-25 MED ORDER — GABAPENTIN 300 MG PO CAPS
300.0000 mg | ORAL_CAPSULE | Freq: Two times a day (BID) | ORAL | Status: DC
  Administered 2019-03-25 – 2019-03-26 (×2): 300 mg via ORAL
  Filled 2019-03-25 (×2): qty 1

## 2019-03-25 MED ORDER — ROCURONIUM BROMIDE 10 MG/ML (PF) SYRINGE
PREFILLED_SYRINGE | INTRAVENOUS | Status: DC | PRN
  Administered 2019-03-25: 50 mg via INTRAVENOUS

## 2019-03-25 MED ORDER — OXYCODONE HCL 5 MG/5ML PO SOLN: 5.0000 mg | Freq: Once | ORAL | Status: AC | PRN

## 2019-03-25 MED ORDER — ONDANSETRON HCL 4 MG/2ML IJ SOLN: 4.0000 mg | Freq: Four times a day (QID) | INTRAMUSCULAR | Status: DC | PRN

## 2019-03-25 MED ORDER — FENTANYL CITRATE (PF) 100 MCG/2ML IJ SOLN: 25.0000 ug | INTRAMUSCULAR | Status: DC | PRN

## 2019-03-25 MED ORDER — PROPOFOL 10 MG/ML IV BOLUS
INTRAVENOUS | Status: AC
  Filled 2019-03-25: qty 20

## 2019-03-25 SURGICAL SUPPLY — 57 items
ADH SKN CLS APL DERMABOND .7 (GAUZE/BANDAGES/DRESSINGS) ×1
APL SKNCLS STERI-STRIP NONHPOA (GAUZE/BANDAGES/DRESSINGS) ×1
BAG DECANTER FOR FLEXI CONT (MISCELLANEOUS) ×3 IMPLANT
BENZOIN TINCTURE PRP APPL 2/3 (GAUZE/BANDAGES/DRESSINGS) ×3 IMPLANT
BUR MATCHSTICK NEURO 3.0 LAGG (BURR) ×3 IMPLANT
CANISTER SUCT 3000ML PPV (MISCELLANEOUS) ×3 IMPLANT
CARTRIDGE OIL MAESTRO DRILL (MISCELLANEOUS) ×1 IMPLANT
CLOSURE WOUND 1/2 X4 (GAUZE/BANDAGES/DRESSINGS) ×1
COVER WAND RF STERILE (DRAPES) ×1 IMPLANT
DERMABOND ADVANCED (GAUZE/BANDAGES/DRESSINGS) ×2
DERMABOND ADVANCED .7 DNX12 (GAUZE/BANDAGES/DRESSINGS) IMPLANT
DIFFUSER DRILL AIR PNEUMATIC (MISCELLANEOUS) ×3 IMPLANT
DRAPE LAPAROTOMY 100X72X124 (DRAPES) ×3 IMPLANT
DRAPE MICROSCOPE LEICA (MISCELLANEOUS) ×1 IMPLANT
DRAPE POUCH INSTRU U-SHP 10X18 (DRAPES) ×1 IMPLANT
DRAPE SURG 17X23 STRL (DRAPES) ×3 IMPLANT
DRSG OPSITE POSTOP 4X6 (GAUZE/BANDAGES/DRESSINGS) ×2 IMPLANT
DURAPREP 26ML APPLICATOR (WOUND CARE) ×3 IMPLANT
ELECT REM PT RETURN 9FT ADLT (ELECTROSURGICAL) ×3
ELECTRODE REM PT RTRN 9FT ADLT (ELECTROSURGICAL) ×1 IMPLANT
GAUZE 4X4 16PLY RFD (DISPOSABLE) IMPLANT
GLOVE BIO SURGEON STRL SZ7 (GLOVE) ×2 IMPLANT
GLOVE BIO SURGEON STRL SZ8 (GLOVE) ×3 IMPLANT
GLOVE BIOGEL PI IND STRL 7.0 (GLOVE) IMPLANT
GLOVE BIOGEL PI IND STRL 7.5 (GLOVE) IMPLANT
GLOVE BIOGEL PI IND STRL 8 (GLOVE) IMPLANT
GLOVE BIOGEL PI INDICATOR 7.0 (GLOVE) ×2
GLOVE BIOGEL PI INDICATOR 7.5 (GLOVE) ×2
GLOVE BIOGEL PI INDICATOR 8 (GLOVE) ×4
GLOVE ECLIPSE 7.5 STRL STRAW (GLOVE) ×4 IMPLANT
GLOVE SURG SS PI 7.5 STRL IVOR (GLOVE) ×10 IMPLANT
GOWN STRL REUS W/ TWL LRG LVL3 (GOWN DISPOSABLE) IMPLANT
GOWN STRL REUS W/ TWL XL LVL3 (GOWN DISPOSABLE) ×1 IMPLANT
GOWN STRL REUS W/TWL 2XL LVL3 (GOWN DISPOSABLE) ×2 IMPLANT
GOWN STRL REUS W/TWL LRG LVL3 (GOWN DISPOSABLE) ×9
GOWN STRL REUS W/TWL XL LVL3 (GOWN DISPOSABLE) ×3
HEMOSTAT POWDER KIT SURGIFOAM (HEMOSTASIS) ×2 IMPLANT
KIT BASIN OR (CUSTOM PROCEDURE TRAY) ×3 IMPLANT
KIT TURNOVER KIT B (KITS) ×3 IMPLANT
NDL HYPO 25X1 1.5 SAFETY (NEEDLE) ×1 IMPLANT
NDL SPNL 20GX3.5 QUINCKE YW (NEEDLE) IMPLANT
NEEDLE HYPO 25X1 1.5 SAFETY (NEEDLE) ×3 IMPLANT
NEEDLE SPNL 20GX3.5 QUINCKE YW (NEEDLE) ×3 IMPLANT
NS IRRIG 1000ML POUR BTL (IV SOLUTION) ×3 IMPLANT
OIL CARTRIDGE MAESTRO DRILL (MISCELLANEOUS) ×3
PACK LAMINECTOMY NEURO (CUSTOM PROCEDURE TRAY) ×3 IMPLANT
PAD ARMBOARD 7.5X6 YLW CONV (MISCELLANEOUS) ×9 IMPLANT
RUBBERBAND STERILE (MISCELLANEOUS) ×2 IMPLANT
SPONGE SURGIFOAM ABS GEL SZ50 (HEMOSTASIS) IMPLANT
STRIP CLOSURE SKIN 1/2X4 (GAUZE/BANDAGES/DRESSINGS) ×2 IMPLANT
SUT VIC AB 0 CT1 18XCR BRD8 (SUTURE) ×1 IMPLANT
SUT VIC AB 0 CT1 8-18 (SUTURE) ×3
SUT VIC AB 2-0 CP2 18 (SUTURE) ×3 IMPLANT
SUT VIC AB 3-0 SH 8-18 (SUTURE) ×5 IMPLANT
TOWEL GREEN STERILE (TOWEL DISPOSABLE) ×3 IMPLANT
TOWEL GREEN STERILE FF (TOWEL DISPOSABLE) ×3 IMPLANT
WATER STERILE IRR 1000ML POUR (IV SOLUTION) ×3 IMPLANT

## 2019-03-25 NOTE — Op Note (Signed)
03/25/2019  3:29 PM  PATIENT:  Mark Robertson  78 y.o. male  PRE-OPERATIVE DIAGNOSIS: Lumbar spinal stenosis L1-2 L2-3 with back pain and leg pain  POST-OPERATIVE DIAGNOSIS:  same  PROCEDURE: Decompressive lumbar laminectomy, medial facetectomy and foraminotomies L1-2 and L2-3 bilaterally  SURGEON:  Sherley Bounds, MD  ASSISTANTS: Glenford Peers FNP  ANESTHESIA:   General  EBL: 100 ml  Total I/O In: -  Out: 100 [Blood:100]  BLOOD ADMINISTERED: none  DRAINS: none  SPECIMEN:  none  INDICATION FOR PROCEDURE: This patient presented with severe back pain with some leg pain. Imaging showed severe spinal stenosis L1-2 L2 3. The patient tried conservative measures without relief. Pain was debilitating. Recommended lumbar laminectomy L1-L2 L23. Patient understood the risks, benefits, and alternatives and potential outcomes and wished to proceed.  PROCEDURE DETAILS: The patient was taken to the operating room and after induction of adequate generalized endotracheal anesthesia, the patient was rolled into the prone position on the Wilson frame and all pressure points were padded. The lumbar region was cleaned and then prepped with DuraPrep and draped in the usual sterile fashion. 5 cc of local anesthesia was injected and then a dorsal midline incision was made and carried down to the lumbo sacral fascia. The fascia was opened and the paraspinous musculature was taken down in a subperiosteal fashion to expose L1-2 and L2-3 bilaterally. Intraoperative x-ray confirmed my level, and then I remove the inferior part of the spinous process of L1 and the entire spinous process of L2 and then used a combination of the high-speed drill and the Kerrison punches to perform a hemilaminectomy, medial facetectomy, and foraminotomy at L1-2 and L2-3 bilaterally. The underlying yellow ligament was opened and removed in a piecemeal fashion to expose the underlying dura and exiting nerve root. I undercut the lateral  recess and dissected down until I was medial to and distal to the pedicle of L2 and L3. The nerve root was well decompressed as was the central canal.  I then palpated with a coronary dilator along the nerve root and into the foramen to assure adequate decompression. I felt no more compression of the nerve root. I irrigated with saline solution containing bacitracin. Achieved hemostasis with bipolar cautery, lined the dura with Gelfoam, and then closed the fascia with 0 Vicryl. I closed the subcutaneous tissues with 2-0 Vicryl and the subcuticular tissues with 3-0 Vicryl. The skin was then closed with benzoin and Steri-Strips. The drapes were removed, a sterile dressing was applied. The patient was awakened from general anesthesia and transferred to the recovery room in stable condition. At the end of the procedure all sponge, needle and instrument counts were correct.    PLAN OF CARE: Admit for overnight observation  PATIENT DISPOSITION:  PACU - hemodynamically stable.   Delay start of Pharmacological VTE agent (>24hrs) due to surgical blood loss or risk of bleeding:  yes

## 2019-03-25 NOTE — Anesthesia Procedure Notes (Signed)
Procedure Name: Intubation Date/Time: 03/25/2019 2:13 PM Performed by: Shirlyn Goltz, CRNA Pre-anesthesia Checklist: Patient identified, Emergency Drugs available, Suction available and Patient being monitored Patient Re-evaluated:Patient Re-evaluated prior to induction Oxygen Delivery Method: Circle system utilized Preoxygenation: Pre-oxygenation with 100% oxygen Induction Type: IV induction Laryngoscope Size: Mac and 4 Grade View: Grade III Tube type: Oral Tube size: 7.5 mm Number of attempts: 1 Airway Equipment and Method: Stylet Placement Confirmation: ETT inserted through vocal cords under direct vision,  positive ETCO2 and breath sounds checked- equal and bilateral Secured at: 23 cm Tube secured with: Tape Dental Injury: Teeth and Oropharynx as per pre-operative assessment

## 2019-03-25 NOTE — H&P (Signed)
Subjective: Patient is a 78 y.o. male admitted for spinal stenosis. Onset of symptoms was several months ago, gradually worsening since that time.  The pain is rated severe, and is located at the across the lower back and radiates to legs. The pain is described as aching and occurs all day. The symptoms have been progressive. Symptoms are exacerbated by exercise. MRI or CT showed stenosis   Past Medical History:  Diagnosis Date  . Arthritis   . Cavitary pneumonia 09/29/2014  . COPD (chronic obstructive pulmonary disease) ([place])   . Depression   . Esophageal dysmotility 09/30/2014  . Esophageal thickening 09/29/2014  . Hiatal hernia 09/30/2014  . Hypercholesteremia   . Hypertension     Past Surgical History:  Procedure Laterality Date  . ABDOMINAL HERNIA REPAIR    . BACK SURGERY    . CATARACT EXTRACTION W/ INTRAOCULAR LENS IMPLANT Right 06/27/2016  . JOINT REPLACEMENT     bilat knee    Prior to Admission medications   Medication Sig Start Date End Date Taking? Authorizing Provider  acetaminophen (TYLENOL) 500 MG tablet Take 1,000 mg by mouth 3 (three) times daily.   Yes [provider]  amoxicillin (AMOXIL) 500 MG tablet Take 2,000 mg by mouth See admin instructions. Take 2000 mg by mouth 1 hour prior to dental appointments   Yes [provider]  aspirin EC 81 MG tablet Take 81 mg by mouth every evening.   Yes [provider]  BREO ELLIPTA 100-25 MCG/INH AEPB Inhale 1 puff into the lungs daily.  01/29/15  Yes [provider]  Cholecalciferol (VITAMIN D) 2000 UNITS tablet Take 2,000 Units by mouth daily.   Yes [provider]  gabapentin (NEURONTIN) 300 MG capsule Take 300 mg by mouth 2 (two) times daily.    Yes [provider]  Multiple Vitamins-Minerals (CENTRUM SILVER) tablet Take 1 tablet by mouth daily with breakfast.   Yes [provider]  olmesartan-hydrochlorothiazide (BENICAR HCT) 20-12.5 MG tablet Take 1 tablet by  mouth daily.   Yes [provider]  omeprazole (PRILOSEC) 20 MG capsule Take 20 mg by mouth 2 (two) times daily before a meal.   Yes [provider]  Polyethyl Glycol-Propyl Glycol (SYSTANE) 0.4-0.3 % SOLN Place 1-2 drops into both eyes 2 (two) times daily as needed (dry eyes).    Yes [provider]  rosuvastatin (CRESTOR) 10 MG tablet Take 10 mg by mouth every evening.   Yes [provider]   No Known Allergies  Social History   Tobacco Use  . Smoking status: Former Smoker    Packs/day: 2.00    Years: 50.00    Pack years: 100.00    Types: Cigarettes    Quit date: 10/13/2001    Years since quitting: 17.4  . Smokeless tobacco: Never Used  Substance Use Topics  . Alcohol use: Yes    Alcohol/week: 2.0 standard drinks    Types: 2 Cans of beer per week    Comment: occasionally    Family History  Problem Relation Age of Onset  . Stroke Father   . Stroke Sister   . Heart failure Brother      Review of Systems  Positive ROS: neg  All other systems have been reviewed and were otherwise negative with the exception of those mentioned in the HPI and as above.  Objective: Vital signs in last 24 hours: Temp:  [98 F (36.7 C)] 98 F (36.7 C) (06/12 1223) Pulse Rate:  [77] 77 (  06/12 1223) Resp:  [18] 18 (06/12 1223) BP: (180)/(96) 180/96 (06/12 1223) SpO2:  [95 %] 95 % (06/12 1223) Weight:  [108.4 kg] 108.4 kg (06/12 1223)  General Appearance: Alert, cooperative, no distress, appears stated age Head: Normocephalic, without obvious abnormality, atraumatic Eyes: PERRL, conjunctiva/corneas clear, EOM's intact    Neck: Supple, symmetrical, trachea midline Back: Symmetric, no curvature, ROM normal, no CVA tenderness Lungs:  respirations unlabored Heart: Regular rate and rhythm Abdomen: Soft, non-tender Extremities: Extremities normal, atraumatic, no cyanosis or edema Pulses: 2+ and symmetric all extremities Skin: Skin color, texture, turgor  normal, no rashes or lesions  NEUROLOGIC:   Mental status: Alert and oriented x4,  no aphasia, good attention span, fund of knowledge, and memory Motor Exam - grossly normal Sensory Exam - grossly normal Reflexes: trace Coordination - grossly normal Gait - not tested Balance - grossly normal Cranial Nerves: I: smell Not tested  II: visual acuity  OS: nl    OD: nl  II: visual fields Full to confrontation  II: pupils Equal, round, reactive to light  III,VII: ptosis None  III,IV,VI: extraocular muscles  Full ROM  V: mastication Normal  V: facial light touch sensation  Normal  V,VII: corneal reflex  Present  VII: facial muscle function - upper  Normal  VII: facial muscle function - lower Normal  VIII: hearing Not tested  IX: soft palate elevation  Normal  IX,X: gag reflex Present  XI: trapezius strength  5/5  XI: sternocleidomastoid strength 5/5  XI: neck flexion strength  5/5  XII: tongue strength  Normal    Data Review Lab Results  Component Value Date   WBC 5.4 03/23/2019   HGB 13.9 03/23/2019   HCT 43.0 03/23/2019   MCV 96.2 03/23/2019   PLT 153 03/23/2019   Lab Results  Component Value Date   NA 139 03/23/2019   K 4.1 03/23/2019   CL 102 03/23/2019   CO2 28 03/23/2019   BUN 18 03/23/2019   CREATININE 1.49 (H) 03/23/2019   GLUCOSE 95 03/23/2019   Lab Results  Component Value Date   INR 1.0 03/23/2019    Assessment/Plan:  Estimated body mass index is 39.77 kg/m as calculated from the following:   Height as of this encounter: 5\' 5"  (1.651 m).   Weight as of this encounter: 108.4 kg. Patient admitted for DLL L1-2 L2-3. Patient has failed a reasonable attempt at conservative therapy.  I explained the condition and procedure to the patient and answered any questions.  Patient wishes to proceed with procedure as planned. Understands risks/ benefits and typical outcomes of procedure.   Eustace Moore 03/25/2019 1:18 PM

## 2019-03-25 NOTE — Anesthesia Postprocedure Evaluation (Signed)
Anesthesia Post Note  Patient: ESHAWN COOR  Procedure(s) Performed: Laminectomy and Foraminotomy - Lumbar one-two, Lumbar two-three (N/A Back)     Patient location during evaluation: PACU Anesthesia Type: General Level of consciousness: awake and alert Pain management: pain level controlled Vital Signs Assessment: post-procedure vital signs reviewed and stable Respiratory status: spontaneous breathing, nonlabored ventilation and respiratory function stable Cardiovascular status: blood pressure returned to baseline and stable Postop Assessment: no apparent nausea or vomiting Anesthetic complications: no    Last Vitals:  Vitals:   03/25/19 1612 03/25/19 1629  BP: (!) 156/79 (!) 168/76  Pulse: 81 87  Resp: 16 19  Temp: 36.4 C 36.6 C  SpO2: 94% 96%    Last Pain:  Vitals:   03/25/19 1629  TempSrc: Oral  PainSc:                  Lidia Collum

## 2019-03-25 NOTE — Transfer of Care (Signed)
Immediate Anesthesia Transfer of Care Note  Patient: Mark Robertson  Procedure(s) Performed: Laminectomy and Foraminotomy - Lumbar one-two, Lumbar two-three (N/A Back)  Patient Location: PACU  Anesthesia Type:General  Level of Consciousness: awake, alert , oriented and patient cooperative  Airway & Oxygen Therapy: Patient Spontanous Breathing  Post-op Assessment: Report given to RN and Post -op Vital signs reviewed and stable  Post vital signs: Reviewed and stable  Last Vitals:  Vitals Value Taken Time  BP 148/83 03/25/19 1544  Temp    Pulse 92 03/25/19 1545  Resp 17 03/25/19 1545  SpO2 89 % 03/25/19 1545  Vitals shown include unvalidated device data.  Last Pain:  Vitals:   03/25/19 1223  TempSrc: Oral         Complications: No apparent anesthesia complications

## 2019-03-25 NOTE — Anesthesia Preprocedure Evaluation (Addendum)
Anesthesia Evaluation  Patient identified by MRN, date of birth, ID band Patient awake    Reviewed: Allergy & Precautions, NPO status , Patient's Chart, lab work & pertinent test results  History of Anesthesia Complications Negative for: history of anesthetic complications  Airway Mallampati: II  TM Distance: >3 FB Neck ROM: Full    Dental  (+) Chipped,    Pulmonary sleep apnea , COPD, former smoker,    Pulmonary exam normal        Cardiovascular hypertension, Normal cardiovascular exam     Neuro/Psych negative neurological ROS  negative psych ROS   GI/Hepatic Neg liver ROS, hiatal hernia, GERD  ,  Endo/Other  Morbid obesity  Renal/GU negative Renal ROS  negative genitourinary   Musculoskeletal negative musculoskeletal ROS (+)   Abdominal   Peds  Hematology negative hematology ROS (+)   Anesthesia Other Findings   Reproductive/Obstetrics                            Anesthesia Physical Anesthesia Plan  ASA: III  Anesthesia Plan: General   Post-op Pain Management:    Induction: Intravenous  PONV Risk Score and Plan: 2 and Ondansetron, Dexamethasone and Treatment may vary due to age or medical condition  Airway Management Planned: Oral ETT  Additional Equipment: None  Intra-op Plan:   Post-operative Plan: Extubation in OR  Informed Consent: I have reviewed the patients History and Physical, chart, labs and discussed the procedure including the risks, benefits and alternatives for the proposed anesthesia with the patient or authorized representative who has indicated his/her understanding and acceptance.     Dental advisory given  Plan Discussed with:   Anesthesia Plan Comments:        Anesthesia Quick Evaluation

## 2019-03-26 ENCOUNTER — Encounter (HOSPITAL_COMMUNITY): Payer: Self-pay | Admitting: Neurological Surgery

## 2019-03-26 DIAGNOSIS — Z79899 Other long term (current) drug therapy: Secondary | ICD-10-CM | POA: Diagnosis not present

## 2019-03-26 DIAGNOSIS — E7800 Pure hypercholesterolemia, unspecified: Secondary | ICD-10-CM | POA: Diagnosis not present

## 2019-03-26 DIAGNOSIS — M1990 Unspecified osteoarthritis, unspecified site: Secondary | ICD-10-CM | POA: Diagnosis not present

## 2019-03-26 DIAGNOSIS — Z87891 Personal history of nicotine dependence: Secondary | ICD-10-CM | POA: Diagnosis not present

## 2019-03-26 DIAGNOSIS — Z8249 Family history of ischemic heart disease and other diseases of the circulatory system: Secondary | ICD-10-CM | POA: Diagnosis not present

## 2019-03-26 DIAGNOSIS — Z1159 Encounter for screening for other viral diseases: Secondary | ICD-10-CM | POA: Diagnosis not present

## 2019-03-26 DIAGNOSIS — Z7951 Long term (current) use of inhaled steroids: Secondary | ICD-10-CM | POA: Diagnosis not present

## 2019-03-26 DIAGNOSIS — M79604 Pain in right leg: Secondary | ICD-10-CM | POA: Diagnosis not present

## 2019-03-26 DIAGNOSIS — J449 Chronic obstructive pulmonary disease, unspecified: Secondary | ICD-10-CM | POA: Diagnosis not present

## 2019-03-26 DIAGNOSIS — Z7982 Long term (current) use of aspirin: Secondary | ICD-10-CM | POA: Diagnosis not present

## 2019-03-26 DIAGNOSIS — I10 Essential (primary) hypertension: Secondary | ICD-10-CM | POA: Diagnosis not present

## 2019-03-26 DIAGNOSIS — M48061 Spinal stenosis, lumbar region without neurogenic claudication: Secondary | ICD-10-CM | POA: Diagnosis not present

## 2019-03-26 DIAGNOSIS — M79605 Pain in left leg: Secondary | ICD-10-CM | POA: Diagnosis not present

## 2019-03-26 DIAGNOSIS — Z96653 Presence of artificial knee joint, bilateral: Secondary | ICD-10-CM | POA: Diagnosis not present

## 2019-03-26 MED ORDER — METHOCARBAMOL 500 MG PO TABS: 500.0000 mg | ORAL_TABLET | Freq: Four times a day (QID) | ORAL | 0 refills | Status: AC | PRN

## 2019-03-26 MED ORDER — HYDROCODONE-ACETAMINOPHEN 7.5-325 MG PO TABS: 1.0000 | ORAL_TABLET | ORAL | 0 refills | Status: AC | PRN

## 2019-03-26 NOTE — Discharge Instructions (Signed)
Wound Care Keep incision covered and dry for 3 days    You may remove outer bandage after 3 days. Do not put any creams, lotions, or ointments on incision. Leave steri-strips on back.  They will fall off by themselves. Activity Walk each and every day, increasing distance each day. No lifting greater than 8 lbs.  Avoid excessive bending and twisting No driving for 2 weeks; may ride as a passenger locally.  Diet Resume your normal soft diet.  Return to Work Will be discussed at you follow up appointment. Call Your Doctor If Any of These Occur Redness, drainage, or swelling at the wound.  Temperature greater than 101 degrees. Severe pain not relieved by pain medication.  Incision starts to come apart. Follow Up Appt Call today for appointment in 1-2 weeks ([phone]) or for problems.  If you have any hardware placed in your spine, you will need an x-ray before your appointment.

## 2019-03-26 NOTE — Progress Notes (Signed)
Subjective: Patient reports patient is doing well  Objective: Vital signs in last 24 hours: Temp:  [97 F (36.1 C)-98 F (36.7 C)] 97.5 F (36.4 C) (06/13 0402) Pulse Rate:  [77-92] 82 (06/13 0402) Resp:  [15-20] 20 (06/13 0402) BP: (146-180)/(68-96) 148/82 (06/13 0402) SpO2:  [91 %-96 %] 92 % (06/13 0402) Weight:  [108.4 kg] 108.4 kg (06/12 1223)  Intake/Output from previous day: 06/12 0701 - 06/13 0700 In: 1210.8 [P.O.:400; I.V.:700; IV Piggyback:110.8] Out: 100 [Blood:100] Intake/Output this shift: Total I/O In: 110.8 [IV Piggyback:110.8] Out: -   Physical Exam: Strength full both legs.  Dressing CDI.  Lab Results: Recent Labs    03/23/19 0939  WBC 5.4  HGB 13.9  HCT 43.0  PLT 153   BMET Recent Labs    03/23/19 0939  NA 139  K 4.1  CL 102  CO2 28  GLUCOSE 95  BUN 18  CREATININE 1.49*  CALCIUM 9.6    Studies/Results: Dg Lumbar Spine 2-3 Views  Result Date: 03/25/2019 CLINICAL DATA:  Intraoperative localization films for patient undergoing lumbar surgery. EXAM: LUMBAR SPINE - 2-3 VIEW COMPARISON:  MRI lumbar spine from [facility] 05/18/2018. FINDINGS: Two lateral views of the lumbar spine are provided. On the first image, a probe is at the level of the L2 pedicles. On the second image, there is a clamp on the L2 spinous process and a probe is directed toward the L2-3 interspace. IMPRESSION: L2-3 localization. Electronically Signed   By: Inge Rise M.D.   On: 03/25/2019 16:27    Assessment/Plan: Patient is doing well.  Discharge home.    LOS: 0 days    Peggyann Shoals, MD 03/26/2019, 6:57 AM

## 2019-03-26 NOTE — Discharge Summary (Signed)
Physician Discharge Summary  Patient ID: Mark Robertson MRN: [id number] DOB/AGE: 1941/01/17 78 y.o.  Admit date: 03/25/2019 Discharge date: 03/26/2019  Admission Diagnoses:Lumbar spinal stenosis L1-2 L2-3 with back pain and leg pain  Discharge Diagnoses: Lumbar spinal stenosis L1-2 L2-3 with back pain and leg pain Active Problems:   S/P lumbar laminectomy   Discharged Condition: good  Hospital Course: Patient underwent Decompressive lumbar laminectomy, medial facetectomy and foraminotomies L1-2 and L2-3 bilaterally.  He did well and was discharged home on POD 1.  Consults: None  Significant Diagnostic Studies: None  Treatments: surgery: Decompressive lumbar laminectomy, medial facetectomy and foraminotomies L1-2 and L2-3 bilaterally  Discharge Exam: Blood pressure (!) 148/82, pulse 82, temperature (!) 97.5 F (36.4 C), temperature source Oral, resp. rate 20, height 5\' 5"  (1.651 m), weight 108.4 kg, SpO2 92 %. Neurologic: Alert and oriented X 3, normal strength and tone. Normal symmetric reflexes. Normal coordination and gait Wound:CDI  Disposition: Home  Discharge Instructions    Diet - low sodium heart healthy   Complete by: As directed    Increase activity slowly   Complete by: As directed      Allergies as of 03/26/2019   No Known Allergies     Medication List    TAKE these medications   acetaminophen 500 MG tablet Commonly known as: TYLENOL Take 1,000 mg by mouth 3 (three) times daily.   amoxicillin 500 MG tablet Commonly known as: AMOXIL Take 2,000 mg by mouth See admin instructions. Take 2000 mg by mouth 1 hour prior to dental appointments   aspirin EC 81 MG tablet Take 81 mg by mouth every evening.   Breo Ellipta 100-25 MCG/INH Aepb Generic drug: fluticasone furoate-vilanterol Inhale 1 puff into the lungs daily.   Centrum Silver tablet Take 1 tablet by mouth daily with breakfast.   gabapentin 300 MG capsule Commonly known as: NEURONTIN Take 300  mg by mouth 2 (two) times daily.   HYDROcodone-acetaminophen 7.5-325 MG tablet Commonly known as: NORCO Take 1 tablet by mouth every 4 (four) hours as needed for moderate pain ((score 4 to 6)).   methocarbamol 500 MG tablet Commonly known as: ROBAXIN Take 1 tablet (500 mg total) by mouth every 6 (six) hours as needed for muscle spasms.   olmesartan-hydrochlorothiazide 20-12.5 MG tablet Commonly known as: BENICAR HCT Take 1 tablet by mouth daily.   omeprazole 20 MG capsule Commonly known as: PRILOSEC Take 20 mg by mouth 2 (two) times daily before a meal.   rosuvastatin 10 MG tablet Commonly known as: CRESTOR Take 10 mg by mouth every evening.   Systane 0.4-0.3 % Soln Generic drug: Polyethyl Glycol-Propyl Glycol Place 1-2 drops into both eyes 2 (two) times daily as needed (dry eyes).   Vitamin D 50 MCG (2000 UT) tablet Take 2,000 Units by mouth daily.        Signed: Peggyann Shoals, MD 03/26/2019, 7:01 AM

## 2019-03-26 NOTE — Progress Notes (Signed)
Discharge instructions/education/AVS/Rx given to patient and verbalized understanding. No drainage, no swelling and no redness noted on incision site. Pain is mild to moderate well controlled by PRN medications. Patient voiding well, and ambulating well. Patient awaiting for transport home.

## 2019-04-13 DIAGNOSIS — H16141 Punctate keratitis, right eye: Secondary | ICD-10-CM | POA: Diagnosis not present

## 2019-04-29 DIAGNOSIS — M79672 Pain in left foot: Secondary | ICD-10-CM | POA: Diagnosis not present

## 2019-04-29 DIAGNOSIS — M25572 Pain in left ankle and joints of left foot: Secondary | ICD-10-CM | POA: Diagnosis not present

## 2019-04-29 DIAGNOSIS — M21612 Bunion of left foot: Secondary | ICD-10-CM | POA: Diagnosis not present

## 2019-05-20 DIAGNOSIS — M65872 Other synovitis and tenosynovitis, left ankle and foot: Secondary | ICD-10-CM | POA: Diagnosis not present

## 2019-05-20 DIAGNOSIS — M25572 Pain in left ankle and joints of left foot: Secondary | ICD-10-CM | POA: Diagnosis not present

## 2019-05-20 DIAGNOSIS — L03032 Cellulitis of left toe: Secondary | ICD-10-CM | POA: Diagnosis not present

## 2019-05-20 DIAGNOSIS — L600 Ingrowing nail: Secondary | ICD-10-CM | POA: Diagnosis not present

## 2019-06-03 DIAGNOSIS — L600 Ingrowing nail: Secondary | ICD-10-CM | POA: Diagnosis not present

## 2019-06-03 DIAGNOSIS — M722 Plantar fascial fibromatosis: Secondary | ICD-10-CM | POA: Diagnosis not present

## 2019-06-03 DIAGNOSIS — M71572 Other bursitis, not elsewhere classified, left ankle and foot: Secondary | ICD-10-CM | POA: Diagnosis not present

## 2019-06-17 DIAGNOSIS — M722 Plantar fascial fibromatosis: Secondary | ICD-10-CM | POA: Diagnosis not present

## 2019-06-17 DIAGNOSIS — M71572 Other bursitis, not elsewhere classified, left ankle and foot: Secondary | ICD-10-CM | POA: Diagnosis not present

## 2019-07-08 DIAGNOSIS — M71572 Other bursitis, not elsewhere classified, left ankle and foot: Secondary | ICD-10-CM | POA: Diagnosis not present

## 2019-07-08 DIAGNOSIS — M722 Plantar fascial fibromatosis: Secondary | ICD-10-CM | POA: Diagnosis not present

## 2019-07-15 DIAGNOSIS — Z23 Encounter for immunization: Secondary | ICD-10-CM | POA: Diagnosis not present

## 2019-08-01 DIAGNOSIS — M65872 Other synovitis and tenosynovitis, left ankle and foot: Secondary | ICD-10-CM | POA: Diagnosis not present

## 2019-08-01 DIAGNOSIS — M722 Plantar fascial fibromatosis: Secondary | ICD-10-CM | POA: Diagnosis not present

## 2019-09-01 IMAGING — CR LUMBAR SPINE - 2-3 VIEW
2 series · 2 of 2 positions shown · non-contrast
Comparison: MRI lumbar spine from [REDACTED] 05/18/2018.

CLINICAL DATA: Intraoperative localization films for patient
undergoing lumbar surgery.

EXAM:
LUMBAR SPINE - 2-3 VIEW

[lateral (1 of 2)]
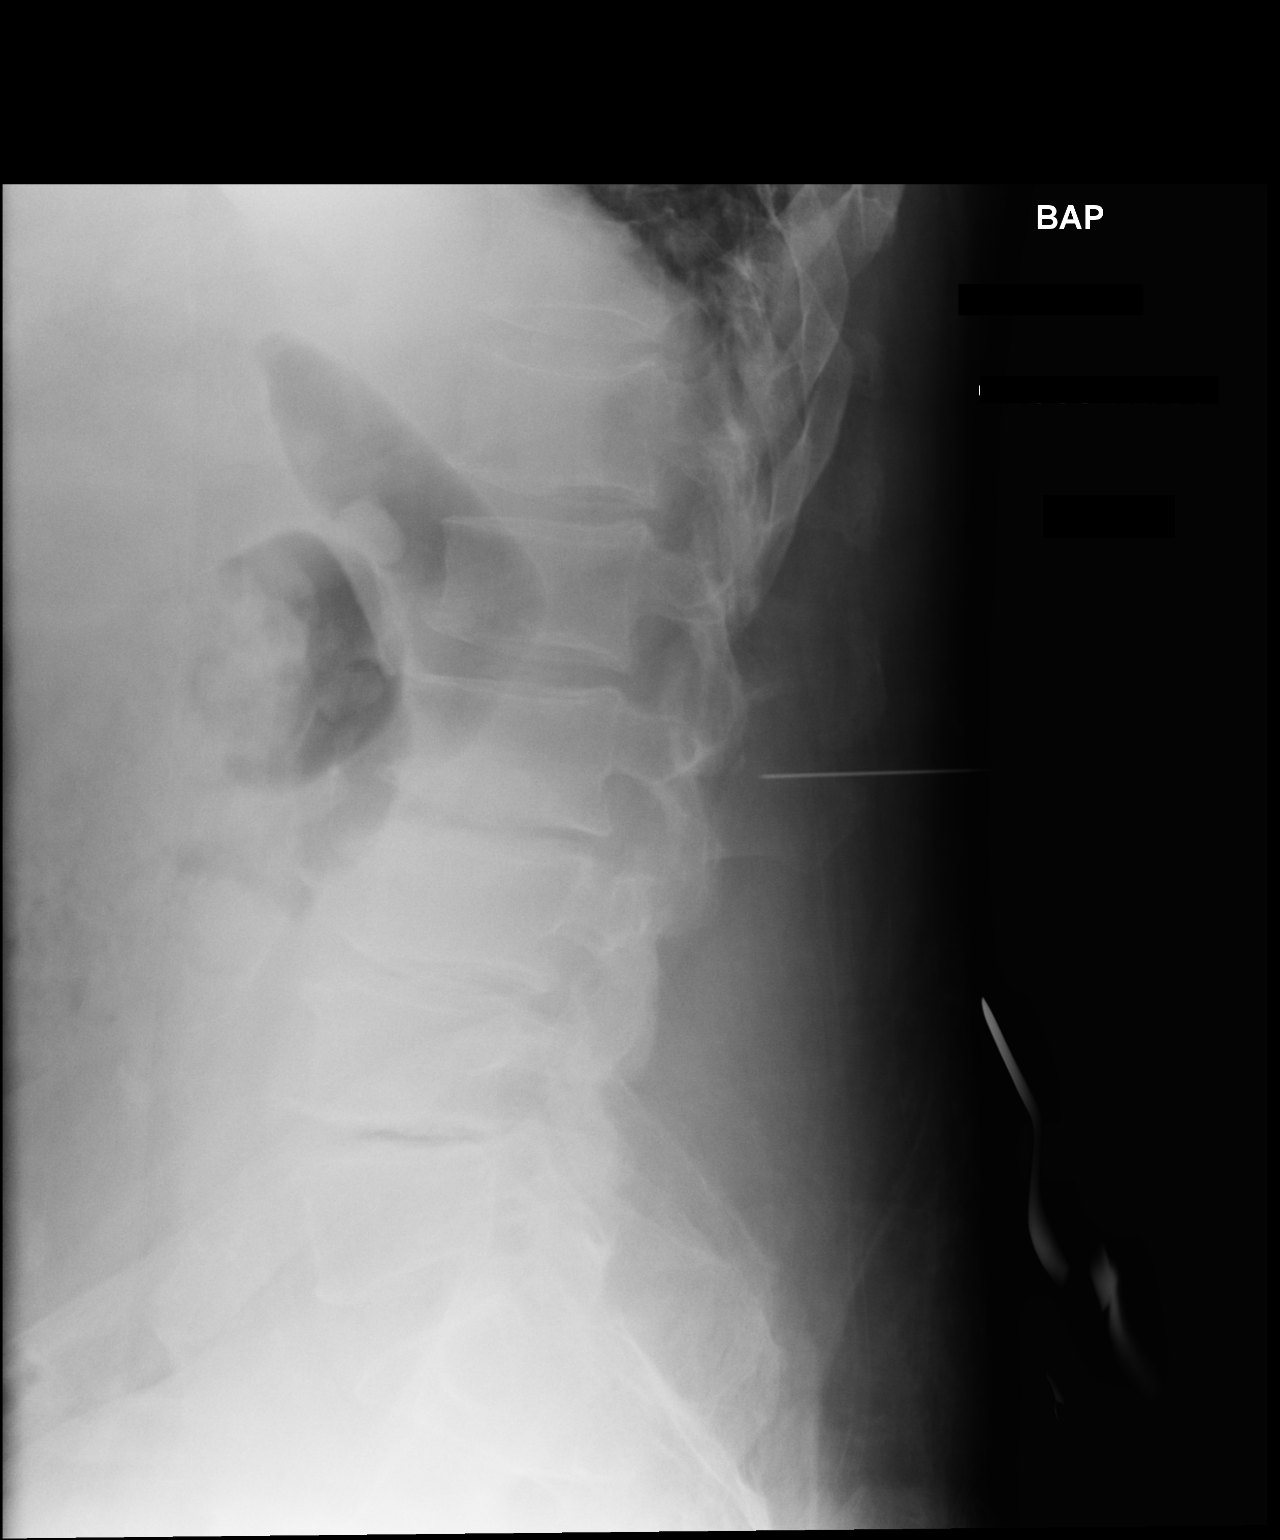

[lateral (2 of 2)]
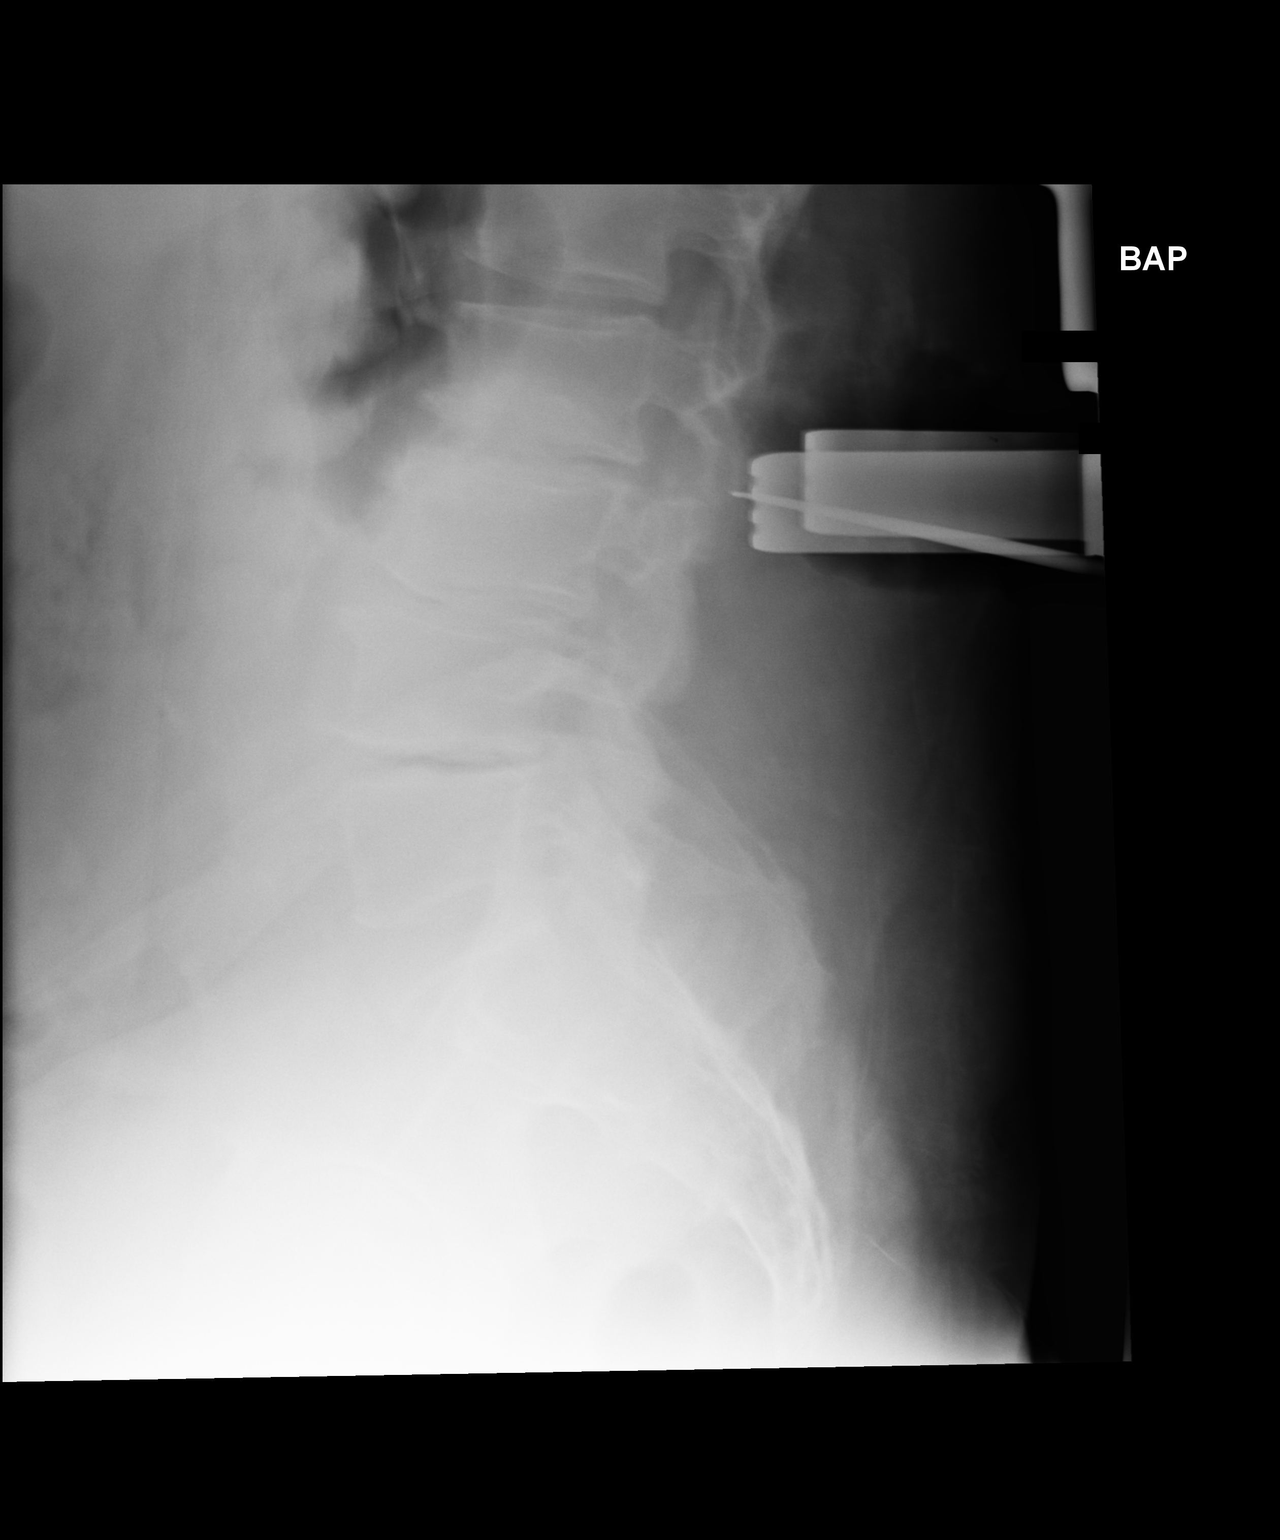

[2 of 2 positions shown; findings below may reference images not displayed]

FINDINGS: Two lateral views of the lumbar spine are provided. On the first
image, a probe is at the level of the L2 pedicles. On the second
image, there is a clamp on the L2 spinous process and a probe is
directed toward the L2-3 interspace.
IMPRESSION: L2-3 localization.

## 2019-10-18 DIAGNOSIS — H26493 Other secondary cataract, bilateral: Secondary | ICD-10-CM | POA: Diagnosis not present

## 2019-11-24 DIAGNOSIS — H26491 Other secondary cataract, right eye: Secondary | ICD-10-CM | POA: Diagnosis not present

## 2019-11-24 DIAGNOSIS — H02831 Dermatochalasis of right upper eyelid: Secondary | ICD-10-CM | POA: Diagnosis not present

## 2019-11-24 DIAGNOSIS — Z961 Presence of intraocular lens: Secondary | ICD-10-CM | POA: Diagnosis not present

## 2019-11-24 DIAGNOSIS — H18413 Arcus senilis, bilateral: Secondary | ICD-10-CM | POA: Diagnosis not present

## 2019-12-07 DIAGNOSIS — C44319 Basal cell carcinoma of skin of other parts of face: Secondary | ICD-10-CM | POA: Diagnosis not present

## 2019-12-07 DIAGNOSIS — L570 Actinic keratosis: Secondary | ICD-10-CM | POA: Diagnosis not present

## 2019-12-07 DIAGNOSIS — D0462 Carcinoma in situ of skin of left upper limb, including shoulder: Secondary | ICD-10-CM | POA: Diagnosis not present

## 2019-12-07 DIAGNOSIS — X32XXXD Exposure to sunlight, subsequent encounter: Secondary | ICD-10-CM | POA: Diagnosis not present

## 2019-12-07 DIAGNOSIS — Z1283 Encounter for screening for malignant neoplasm of skin: Secondary | ICD-10-CM | POA: Diagnosis not present

## 2019-12-07 DIAGNOSIS — L821 Other seborrheic keratosis: Secondary | ICD-10-CM | POA: Diagnosis not present

## 2019-12-08 DIAGNOSIS — Z961 Presence of intraocular lens: Secondary | ICD-10-CM | POA: Diagnosis not present

## 2019-12-15 ENCOUNTER — Ambulatory Visit: Payer: Medicare Other | Attending: Internal Medicine

## 2019-12-15 DIAGNOSIS — Z23 Encounter for immunization: Secondary | ICD-10-CM

## 2019-12-15 NOTE — Progress Notes (Signed)
   Covid-19 Vaccination Clinic  Name:  Mark Robertson    MRN: [id number] DOB: [birth year]  12/15/2019  [name] was observed post Covid-19 immunization for 15 minutes without incident. He was provided with Vaccine Information Sheet and instruction to access the V-Safe system.   [name] was instructed to call 911 with any severe reactions post vaccine: Marland Kitchen Difficulty breathing  . Swelling of face and throat  . A fast heartbeat  . A bad rash all over body  . Dizziness and weakness   Immunizations Administered    Name Date Dose VIS Date Route   Pfizer COVID-19 Vaccine 12/15/2019  9:38 AM 0.3 mL 09/23/2019 Intramuscular   Manufacturer: [place]   Lot: [id number]   [place]: [id number]

## 2020-01-04 DIAGNOSIS — Z08 Encounter for follow-up examination after completed treatment for malignant neoplasm: Secondary | ICD-10-CM | POA: Diagnosis not present

## 2020-01-04 DIAGNOSIS — C44319 Basal cell carcinoma of skin of other parts of face: Secondary | ICD-10-CM | POA: Diagnosis not present

## 2020-01-04 DIAGNOSIS — L570 Actinic keratosis: Secondary | ICD-10-CM | POA: Diagnosis not present

## 2020-01-04 DIAGNOSIS — Z85828 Personal history of other malignant neoplasm of skin: Secondary | ICD-10-CM | POA: Diagnosis not present

## 2020-01-04 DIAGNOSIS — X32XXXD Exposure to sunlight, subsequent encounter: Secondary | ICD-10-CM | POA: Diagnosis not present

## 2020-01-10 ENCOUNTER — Ambulatory Visit: Payer: Medicare Other | Attending: Internal Medicine

## 2020-01-10 DIAGNOSIS — Z23 Encounter for immunization: Secondary | ICD-10-CM

## 2020-01-10 NOTE — Progress Notes (Signed)
   Covid-19 Vaccination Clinic  Name:  Mark Robertson    MRN: [id number] DOB: [birth year]  01/10/2020  [name] was observed post Covid-19 immunization for 15 minutes without incident. He was provided with Vaccine Information Sheet and instruction to access the V-Safe system.   [name] was instructed to call 911 with any severe reactions post vaccine: Marland Kitchen Difficulty breathing  . Swelling of face and throat  . A fast heartbeat  . A bad rash all over body  . Dizziness and weakness   Immunizations Administered    Name Date Dose VIS Date Route   Pfizer COVID-19 Vaccine 01/10/2020  1:51 PM 0.3 mL 09/23/2019 Intramuscular   Manufacturer: [employer], [employer]   Lot: [id number]   [place]: [id number]

## 2020-01-23 DIAGNOSIS — E7800 Pure hypercholesterolemia, unspecified: Secondary | ICD-10-CM | POA: Diagnosis not present

## 2020-01-23 DIAGNOSIS — I10 Essential (primary) hypertension: Secondary | ICD-10-CM | POA: Diagnosis not present

## 2020-01-25 DIAGNOSIS — Z85828 Personal history of other malignant neoplasm of skin: Secondary | ICD-10-CM | POA: Diagnosis not present

## 2020-01-25 DIAGNOSIS — Z08 Encounter for follow-up examination after completed treatment for malignant neoplasm: Secondary | ICD-10-CM | POA: Diagnosis not present

## 2020-01-26 DIAGNOSIS — J449 Chronic obstructive pulmonary disease, unspecified: Secondary | ICD-10-CM | POA: Diagnosis not present

## 2020-01-26 DIAGNOSIS — I10 Essential (primary) hypertension: Secondary | ICD-10-CM | POA: Diagnosis not present

## 2020-01-26 DIAGNOSIS — G4733 Obstructive sleep apnea (adult) (pediatric): Secondary | ICD-10-CM | POA: Diagnosis not present

## 2020-01-26 DIAGNOSIS — Z0000 Encounter for general adult medical examination without abnormal findings: Secondary | ICD-10-CM | POA: Diagnosis not present

## 2020-02-22 DIAGNOSIS — Z85828 Personal history of other malignant neoplasm of skin: Secondary | ICD-10-CM | POA: Diagnosis not present

## 2020-02-22 DIAGNOSIS — Z08 Encounter for follow-up examination after completed treatment for malignant neoplasm: Secondary | ICD-10-CM | POA: Diagnosis not present

## 2020-04-24 ENCOUNTER — Telehealth (HOSPITAL_COMMUNITY): Payer: Self-pay | Admitting: *Deleted

## 2020-05-01 ENCOUNTER — Encounter (HOSPITAL_COMMUNITY): Payer: Self-pay | Admitting: *Deleted

## 2020-05-01 NOTE — Progress Notes (Signed)
Received referral notification from Dr. Shelia Media for this pt to participate in pulmonary rehab with the the diagnosis of emphysema and sleep apnea.  Pt who is well known to the pulmonary rehab staff from previous participation, reached out and wanted to do pulmonary rehab again. Reviewed Medical summary . Called and requested last office visit which was in April.   Pt also sees Dr. Elsworth Soho for sleep apnea however last seen in 2016 - pt was moving to the [facility] area.  Pt with Covid Risk Score - 6. Pt with recent back surgery on 03/24/20.  There is not any mention of physical therapy specific for his back. Pt appropriate for scheduling for Pulmonary rehab when MD order form received .  Will forward to support staff for verification of insurance eligibility/benefits with pt consent. Cherre Huger, BSN Cardiac and Training and development officer

## 2020-05-02 ENCOUNTER — Telehealth (HOSPITAL_COMMUNITY): Payer: Self-pay | Admitting: Internal Medicine

## 2020-05-03 DIAGNOSIS — M79605 Pain in left leg: Secondary | ICD-10-CM | POA: Diagnosis not present

## 2020-05-03 DIAGNOSIS — M79673 Pain in unspecified foot: Secondary | ICD-10-CM | POA: Diagnosis not present

## 2020-05-03 DIAGNOSIS — M79604 Pain in right leg: Secondary | ICD-10-CM | POA: Diagnosis not present

## 2020-05-08 ENCOUNTER — Telehealth (HOSPITAL_COMMUNITY): Payer: Self-pay

## 2020-05-08 NOTE — Telephone Encounter (Signed)
Called patient to see if he is interested in the Pulmonary Rehab Program. Patient expressed interest. Explained scheduling process and went over insurance, patient verbalized understanding. Someone from our pulmonary rehab staff will contact pt at a later time. 

## 2020-05-08 NOTE — Telephone Encounter (Signed)
Pt insurance is active and benefits verified through [facility] Medicare Co-pay $20, DED 0/0 met, out of pocket $3,600/$134.27 met, co-insurance 0%. no pre-authorization required. Passport, LisaB/[employer] 05/08/2020_0 :09pm, REF# [id number]

## 2020-05-10 DIAGNOSIS — H524 Presbyopia: Secondary | ICD-10-CM | POA: Diagnosis not present

## 2020-05-10 DIAGNOSIS — Z9841 Cataract extraction status, right eye: Secondary | ICD-10-CM | POA: Diagnosis not present

## 2020-05-22 ENCOUNTER — Telehealth (HOSPITAL_COMMUNITY): Payer: Self-pay

## 2020-05-22 ENCOUNTER — Telehealth (HOSPITAL_COMMUNITY): Payer: Self-pay | Admitting: Internal Medicine

## 2020-05-23 DIAGNOSIS — L570 Actinic keratosis: Secondary | ICD-10-CM | POA: Diagnosis not present

## 2020-05-23 DIAGNOSIS — Z08 Encounter for follow-up examination after completed treatment for malignant neoplasm: Secondary | ICD-10-CM | POA: Diagnosis not present

## 2020-05-23 DIAGNOSIS — L720 Epidermal cyst: Secondary | ICD-10-CM | POA: Diagnosis not present

## 2020-05-23 DIAGNOSIS — Z85828 Personal history of other malignant neoplasm of skin: Secondary | ICD-10-CM | POA: Diagnosis not present

## 2020-05-23 DIAGNOSIS — X32XXXD Exposure to sunlight, subsequent encounter: Secondary | ICD-10-CM | POA: Diagnosis not present

## 2020-05-24 ENCOUNTER — Telehealth (HOSPITAL_COMMUNITY): Payer: Self-pay

## 2020-05-29 ENCOUNTER — Telehealth (HOSPITAL_COMMUNITY): Payer: Self-pay | Admitting: Internal Medicine

## 2020-06-07 ENCOUNTER — Ambulatory Visit (HOSPITAL_COMMUNITY): Payer: Medicare Other

## 2020-06-20 DIAGNOSIS — R319 Hematuria, unspecified: Secondary | ICD-10-CM | POA: Diagnosis not present

## 2020-06-28 DIAGNOSIS — Z23 Encounter for immunization: Secondary | ICD-10-CM | POA: Diagnosis not present

## 2020-06-28 DIAGNOSIS — R42 Dizziness and giddiness: Secondary | ICD-10-CM | POA: Diagnosis not present

## 2020-07-05 DIAGNOSIS — E7800 Pure hypercholesterolemia, unspecified: Secondary | ICD-10-CM | POA: Diagnosis not present

## 2020-07-05 DIAGNOSIS — R42 Dizziness and giddiness: Secondary | ICD-10-CM | POA: Diagnosis not present

## 2020-07-17 DIAGNOSIS — Z961 Presence of intraocular lens: Secondary | ICD-10-CM | POA: Diagnosis not present

## 2020-07-17 DIAGNOSIS — H04123 Dry eye syndrome of bilateral lacrimal glands: Secondary | ICD-10-CM | POA: Diagnosis not present

## 2020-07-17 DIAGNOSIS — H26492 Other secondary cataract, left eye: Secondary | ICD-10-CM | POA: Diagnosis not present

## 2020-07-17 DIAGNOSIS — H18413 Arcus senilis, bilateral: Secondary | ICD-10-CM | POA: Diagnosis not present

## 2020-11-08 DIAGNOSIS — W548XXA Other contact with dog, initial encounter: Secondary | ICD-10-CM | POA: Diagnosis not present

## 2020-11-08 DIAGNOSIS — S60921A Unspecified superficial injury of right hand, initial encounter: Secondary | ICD-10-CM | POA: Diagnosis not present

## 2020-12-10 DIAGNOSIS — E7800 Pure hypercholesterolemia, unspecified: Secondary | ICD-10-CM | POA: Diagnosis not present

## 2020-12-10 DIAGNOSIS — I129 Hypertensive chronic kidney disease with stage 1 through stage 4 chronic kidney disease, or unspecified chronic kidney disease: Secondary | ICD-10-CM | POA: Diagnosis not present

## 2020-12-10 DIAGNOSIS — N183 Chronic kidney disease, stage 3 unspecified: Secondary | ICD-10-CM | POA: Diagnosis not present

## 2021-01-10 DIAGNOSIS — I129 Hypertensive chronic kidney disease with stage 1 through stage 4 chronic kidney disease, or unspecified chronic kidney disease: Secondary | ICD-10-CM | POA: Diagnosis not present

## 2021-01-10 DIAGNOSIS — E7800 Pure hypercholesterolemia, unspecified: Secondary | ICD-10-CM | POA: Diagnosis not present

## 2021-01-10 DIAGNOSIS — N183 Chronic kidney disease, stage 3 unspecified: Secondary | ICD-10-CM | POA: Diagnosis not present

## 2021-01-10 DIAGNOSIS — J441 Chronic obstructive pulmonary disease with (acute) exacerbation: Secondary | ICD-10-CM | POA: Diagnosis not present

## 2021-01-23 DIAGNOSIS — I10 Essential (primary) hypertension: Secondary | ICD-10-CM | POA: Diagnosis not present

## 2021-01-23 DIAGNOSIS — E782 Mixed hyperlipidemia: Secondary | ICD-10-CM | POA: Diagnosis not present

## 2021-04-11 DIAGNOSIS — I129 Hypertensive chronic kidney disease with stage 1 through stage 4 chronic kidney disease, or unspecified chronic kidney disease: Secondary | ICD-10-CM | POA: Diagnosis not present

## 2021-04-11 DIAGNOSIS — N183 Chronic kidney disease, stage 3 unspecified: Secondary | ICD-10-CM | POA: Diagnosis not present

## 2021-04-11 DIAGNOSIS — E7800 Pure hypercholesterolemia, unspecified: Secondary | ICD-10-CM | POA: Diagnosis not present

## 2021-04-11 DIAGNOSIS — J441 Chronic obstructive pulmonary disease with (acute) exacerbation: Secondary | ICD-10-CM | POA: Diagnosis not present

## 2021-04-25 DIAGNOSIS — I10 Essential (primary) hypertension: Secondary | ICD-10-CM | POA: Diagnosis not present

## 2021-04-25 DIAGNOSIS — R7309 Other abnormal glucose: Secondary | ICD-10-CM | POA: Diagnosis not present

## 2021-04-30 DIAGNOSIS — E7800 Pure hypercholesterolemia, unspecified: Secondary | ICD-10-CM | POA: Diagnosis not present

## 2021-04-30 DIAGNOSIS — I10 Essential (primary) hypertension: Secondary | ICD-10-CM | POA: Diagnosis not present

## 2021-04-30 DIAGNOSIS — K219 Gastro-esophageal reflux disease without esophagitis: Secondary | ICD-10-CM | POA: Diagnosis not present

## 2021-04-30 DIAGNOSIS — Z0001 Encounter for general adult medical examination with abnormal findings: Secondary | ICD-10-CM | POA: Diagnosis not present

## 2021-05-01 ENCOUNTER — Other Ambulatory Visit: Payer: Self-pay | Admitting: Internal Medicine

## 2021-05-01 DIAGNOSIS — E7800 Pure hypercholesterolemia, unspecified: Secondary | ICD-10-CM

## 2021-05-29 ENCOUNTER — Other Ambulatory Visit: Payer: Self-pay

## 2021-05-29 ENCOUNTER — Ambulatory Visit
Admission: RE | Admit: 2021-05-29 | Discharge: 2021-05-29 | Disposition: A | Discharge status: Home / Self Care | Payer: No Typology Code available for payment source | Source: Ambulatory Visit | Attending: Internal Medicine | Admitting: Internal Medicine

## 2021-05-29 DIAGNOSIS — E7800 Pure hypercholesterolemia, unspecified: Secondary | ICD-10-CM

## 2021-06-05 DIAGNOSIS — C44311 Basal cell carcinoma of skin of nose: Secondary | ICD-10-CM | POA: Diagnosis not present

## 2021-06-05 DIAGNOSIS — D045 Carcinoma in situ of skin of trunk: Secondary | ICD-10-CM | POA: Diagnosis not present

## 2021-06-05 DIAGNOSIS — L820 Inflamed seborrheic keratosis: Secondary | ICD-10-CM | POA: Diagnosis not present

## 2021-07-17 DIAGNOSIS — Z08 Encounter for follow-up examination after completed treatment for malignant neoplasm: Secondary | ICD-10-CM | POA: Diagnosis not present

## 2021-07-17 DIAGNOSIS — L570 Actinic keratosis: Secondary | ICD-10-CM | POA: Diagnosis not present

## 2021-07-17 DIAGNOSIS — X32XXXD Exposure to sunlight, subsequent encounter: Secondary | ICD-10-CM | POA: Diagnosis not present

## 2021-07-17 DIAGNOSIS — Z85828 Personal history of other malignant neoplasm of skin: Secondary | ICD-10-CM | POA: Diagnosis not present

## 2021-09-07 DIAGNOSIS — L989 Disorder of the skin and subcutaneous tissue, unspecified: Secondary | ICD-10-CM | POA: Diagnosis not present

## 2021-09-17 DIAGNOSIS — L570 Actinic keratosis: Secondary | ICD-10-CM | POA: Diagnosis not present

## 2021-09-17 DIAGNOSIS — X32XXXD Exposure to sunlight, subsequent encounter: Secondary | ICD-10-CM | POA: Diagnosis not present

## 2021-09-17 DIAGNOSIS — C44629 Squamous cell carcinoma of skin of left upper limb, including shoulder: Secondary | ICD-10-CM | POA: Diagnosis not present

## 2021-10-01 DIAGNOSIS — Z85828 Personal history of other malignant neoplasm of skin: Secondary | ICD-10-CM | POA: Diagnosis not present

## 2021-10-01 DIAGNOSIS — Z08 Encounter for follow-up examination after completed treatment for malignant neoplasm: Secondary | ICD-10-CM | POA: Diagnosis not present

## 2021-11-30 DIAGNOSIS — R059 Cough, unspecified: Secondary | ICD-10-CM | POA: Diagnosis not present

## 2021-11-30 DIAGNOSIS — R0989 Other specified symptoms and signs involving the circulatory and respiratory systems: Secondary | ICD-10-CM | POA: Diagnosis not present

## 2021-11-30 DIAGNOSIS — J984 Other disorders of lung: Secondary | ICD-10-CM | POA: Diagnosis not present

## 2021-11-30 DIAGNOSIS — Z20822 Contact with and (suspected) exposure to covid-19: Secondary | ICD-10-CM | POA: Diagnosis not present

## 2021-11-30 DIAGNOSIS — Z719 Counseling, unspecified: Secondary | ICD-10-CM | POA: Diagnosis not present

## 2021-12-03 DIAGNOSIS — Z85828 Personal history of other malignant neoplasm of skin: Secondary | ICD-10-CM | POA: Diagnosis not present

## 2021-12-03 DIAGNOSIS — D2221 Melanocytic nevi of right ear and external auricular canal: Secondary | ICD-10-CM | POA: Diagnosis not present

## 2021-12-03 DIAGNOSIS — D485 Neoplasm of uncertain behavior of skin: Secondary | ICD-10-CM | POA: Diagnosis not present

## 2021-12-03 DIAGNOSIS — L570 Actinic keratosis: Secondary | ICD-10-CM | POA: Diagnosis not present

## 2021-12-03 DIAGNOSIS — Z08 Encounter for follow-up examination after completed treatment for malignant neoplasm: Secondary | ICD-10-CM | POA: Diagnosis not present

## 2021-12-03 DIAGNOSIS — X32XXXD Exposure to sunlight, subsequent encounter: Secondary | ICD-10-CM | POA: Diagnosis not present

## 2022-02-11 DIAGNOSIS — R059 Cough, unspecified: Secondary | ICD-10-CM | POA: Diagnosis not present

## 2022-02-11 DIAGNOSIS — J3489 Other specified disorders of nose and nasal sinuses: Secondary | ICD-10-CM | POA: Diagnosis not present

## 2022-02-11 DIAGNOSIS — J029 Acute pharyngitis, unspecified: Secondary | ICD-10-CM | POA: Diagnosis not present

## 2022-02-11 DIAGNOSIS — J069 Acute upper respiratory infection, unspecified: Secondary | ICD-10-CM | POA: Diagnosis not present

## 2022-02-11 DIAGNOSIS — Z20822 Contact with and (suspected) exposure to covid-19: Secondary | ICD-10-CM | POA: Diagnosis not present

## 2022-02-22 DIAGNOSIS — R062 Wheezing: Secondary | ICD-10-CM | POA: Diagnosis not present

## 2022-02-22 DIAGNOSIS — R059 Cough, unspecified: Secondary | ICD-10-CM | POA: Diagnosis not present

## 2022-02-22 DIAGNOSIS — J40 Bronchitis, not specified as acute or chronic: Secondary | ICD-10-CM | POA: Diagnosis not present

## 2022-03-01 DIAGNOSIS — R918 Other nonspecific abnormal finding of lung field: Secondary | ICD-10-CM | POA: Diagnosis not present

## 2022-03-01 DIAGNOSIS — J440 Chronic obstructive pulmonary disease with acute lower respiratory infection: Secondary | ICD-10-CM | POA: Diagnosis not present

## 2022-03-01 DIAGNOSIS — R000 Tachycardia, unspecified: Secondary | ICD-10-CM | POA: Diagnosis not present

## 2022-03-01 DIAGNOSIS — R1310 Dysphagia, unspecified: Secondary | ICD-10-CM | POA: Diagnosis not present

## 2022-03-01 DIAGNOSIS — J441 Chronic obstructive pulmonary disease with (acute) exacerbation: Secondary | ICD-10-CM | POA: Diagnosis not present

## 2022-03-01 DIAGNOSIS — Z87891 Personal history of nicotine dependence: Secondary | ICD-10-CM | POA: Diagnosis not present

## 2022-03-01 DIAGNOSIS — J90 Pleural effusion, not elsewhere classified: Secondary | ICD-10-CM | POA: Diagnosis not present

## 2022-03-01 DIAGNOSIS — R0902 Hypoxemia: Secondary | ICD-10-CM | POA: Diagnosis not present

## 2022-03-01 DIAGNOSIS — Z7982 Long term (current) use of aspirin: Secondary | ICD-10-CM | POA: Diagnosis not present

## 2022-03-01 DIAGNOSIS — I10 Essential (primary) hypertension: Secondary | ICD-10-CM | POA: Diagnosis not present

## 2022-03-01 DIAGNOSIS — J8410 Pulmonary fibrosis, unspecified: Secondary | ICD-10-CM | POA: Diagnosis not present

## 2022-03-01 DIAGNOSIS — J9601 Acute respiratory failure with hypoxia: Secondary | ICD-10-CM | POA: Diagnosis not present

## 2022-03-01 DIAGNOSIS — R0602 Shortness of breath: Secondary | ICD-10-CM | POA: Diagnosis not present

## 2022-03-01 DIAGNOSIS — E785 Hyperlipidemia, unspecified: Secondary | ICD-10-CM | POA: Diagnosis not present

## 2022-03-01 DIAGNOSIS — Z7951 Long term (current) use of inhaled steroids: Secondary | ICD-10-CM | POA: Diagnosis not present

## 2022-03-05 DIAGNOSIS — E785 Hyperlipidemia, unspecified: Secondary | ICD-10-CM | POA: Diagnosis not present

## 2022-03-05 DIAGNOSIS — R1310 Dysphagia, unspecified: Secondary | ICD-10-CM | POA: Diagnosis not present

## 2022-03-05 DIAGNOSIS — K224 Dyskinesia of esophagus: Secondary | ICD-10-CM | POA: Diagnosis not present

## 2022-03-05 DIAGNOSIS — I10 Essential (primary) hypertension: Secondary | ICD-10-CM | POA: Diagnosis not present

## 2022-03-05 DIAGNOSIS — Z79899 Other long term (current) drug therapy: Secondary | ICD-10-CM | POA: Diagnosis not present

## 2022-03-06 DIAGNOSIS — R1310 Dysphagia, unspecified: Secondary | ICD-10-CM | POA: Diagnosis not present

## 2022-03-18 DIAGNOSIS — R1310 Dysphagia, unspecified: Secondary | ICD-10-CM | POA: Diagnosis not present

## 2022-03-18 DIAGNOSIS — K449 Diaphragmatic hernia without obstruction or gangrene: Secondary | ICD-10-CM | POA: Diagnosis not present

## 2022-03-18 DIAGNOSIS — K2970 Gastritis, unspecified, without bleeding: Secondary | ICD-10-CM | POA: Diagnosis not present

## 2022-03-18 DIAGNOSIS — K269 Duodenal ulcer, unspecified as acute or chronic, without hemorrhage or perforation: Secondary | ICD-10-CM | POA: Diagnosis not present

## 2022-03-18 DIAGNOSIS — K2950 Unspecified chronic gastritis without bleeding: Secondary | ICD-10-CM | POA: Diagnosis not present

## 2022-03-18 DIAGNOSIS — I10 Essential (primary) hypertension: Secondary | ICD-10-CM | POA: Diagnosis not present

## 2022-03-31 DIAGNOSIS — R1030 Lower abdominal pain, unspecified: Secondary | ICD-10-CM | POA: Diagnosis not present

## 2022-03-31 DIAGNOSIS — R109 Unspecified abdominal pain: Secondary | ICD-10-CM | POA: Diagnosis not present

## 2022-03-31 DIAGNOSIS — K644 Residual hemorrhoidal skin tags: Secondary | ICD-10-CM | POA: Diagnosis not present

## 2022-03-31 DIAGNOSIS — R197 Diarrhea, unspecified: Secondary | ICD-10-CM | POA: Diagnosis not present

## 2022-05-01 DIAGNOSIS — Z7982 Long term (current) use of aspirin: Secondary | ICD-10-CM | POA: Diagnosis not present

## 2022-05-01 DIAGNOSIS — R7309 Other abnormal glucose: Secondary | ICD-10-CM | POA: Diagnosis not present

## 2022-05-01 DIAGNOSIS — I10 Essential (primary) hypertension: Secondary | ICD-10-CM | POA: Diagnosis not present

## 2022-05-06 DIAGNOSIS — X32XXXD Exposure to sunlight, subsequent encounter: Secondary | ICD-10-CM | POA: Diagnosis not present

## 2022-05-06 DIAGNOSIS — I10 Essential (primary) hypertension: Secondary | ICD-10-CM | POA: Diagnosis not present

## 2022-05-06 DIAGNOSIS — N183 Chronic kidney disease, stage 3 unspecified: Secondary | ICD-10-CM | POA: Diagnosis not present

## 2022-05-06 DIAGNOSIS — R911 Solitary pulmonary nodule: Secondary | ICD-10-CM | POA: Diagnosis not present

## 2022-05-06 DIAGNOSIS — Z0000 Encounter for general adult medical examination without abnormal findings: Secondary | ICD-10-CM | POA: Diagnosis not present

## 2022-05-06 DIAGNOSIS — L570 Actinic keratosis: Secondary | ICD-10-CM | POA: Diagnosis not present

## 2022-05-06 DIAGNOSIS — Z23 Encounter for immunization: Secondary | ICD-10-CM | POA: Diagnosis not present

## 2022-05-06 DIAGNOSIS — L720 Epidermal cyst: Secondary | ICD-10-CM | POA: Diagnosis not present

## 2022-05-06 DIAGNOSIS — K219 Gastro-esophageal reflux disease without esophagitis: Secondary | ICD-10-CM | POA: Diagnosis not present

## 2022-05-06 DIAGNOSIS — E7800 Pure hypercholesterolemia, unspecified: Secondary | ICD-10-CM | POA: Diagnosis not present

## 2022-05-06 DIAGNOSIS — D1809 Hemangioma of other sites: Secondary | ICD-10-CM | POA: Diagnosis not present

## 2022-05-06 DIAGNOSIS — D225 Melanocytic nevi of trunk: Secondary | ICD-10-CM | POA: Diagnosis not present

## 2022-05-06 DIAGNOSIS — J449 Chronic obstructive pulmonary disease, unspecified: Secondary | ICD-10-CM | POA: Diagnosis not present

## 2022-05-06 DIAGNOSIS — D485 Neoplasm of uncertain behavior of skin: Secondary | ICD-10-CM | POA: Diagnosis not present

## 2022-05-06 DIAGNOSIS — L821 Other seborrheic keratosis: Secondary | ICD-10-CM | POA: Diagnosis not present

## 2022-05-06 DIAGNOSIS — Z1212 Encounter for screening for malignant neoplasm of rectum: Secondary | ICD-10-CM | POA: Diagnosis not present

## 2022-05-06 DIAGNOSIS — E875 Hyperkalemia: Secondary | ICD-10-CM | POA: Diagnosis not present

## 2022-05-26 DIAGNOSIS — H02132 Senile ectropion of right lower eyelid: Secondary | ICD-10-CM | POA: Diagnosis not present

## 2022-05-26 DIAGNOSIS — H04123 Dry eye syndrome of bilateral lacrimal glands: Secondary | ICD-10-CM | POA: Diagnosis not present

## 2022-05-26 DIAGNOSIS — H02135 Senile ectropion of left lower eyelid: Secondary | ICD-10-CM | POA: Diagnosis not present

## 2022-06-02 DIAGNOSIS — Z7982 Long term (current) use of aspirin: Secondary | ICD-10-CM | POA: Diagnosis not present

## 2022-06-02 DIAGNOSIS — M542 Cervicalgia: Secondary | ICD-10-CM | POA: Diagnosis not present

## 2022-06-02 DIAGNOSIS — E785 Hyperlipidemia, unspecified: Secondary | ICD-10-CM | POA: Diagnosis not present

## 2022-06-02 DIAGNOSIS — I10 Essential (primary) hypertension: Secondary | ICD-10-CM | POA: Diagnosis not present

## 2022-06-02 DIAGNOSIS — M25512 Pain in left shoulder: Secondary | ICD-10-CM | POA: Diagnosis not present

## 2022-06-02 DIAGNOSIS — M19012 Primary osteoarthritis, left shoulder: Secondary | ICD-10-CM | POA: Diagnosis not present

## 2022-06-02 DIAGNOSIS — J449 Chronic obstructive pulmonary disease, unspecified: Secondary | ICD-10-CM | POA: Diagnosis not present

## 2022-06-02 DIAGNOSIS — Z79899 Other long term (current) drug therapy: Secondary | ICD-10-CM | POA: Diagnosis not present

## 2022-06-02 DIAGNOSIS — R0989 Other specified symptoms and signs involving the circulatory and respiratory systems: Secondary | ICD-10-CM | POA: Diagnosis not present

## 2022-06-02 DIAGNOSIS — S161XXA Strain of muscle, fascia and tendon at neck level, initial encounter: Secondary | ICD-10-CM | POA: Diagnosis not present

## 2022-06-12 DIAGNOSIS — J449 Chronic obstructive pulmonary disease, unspecified: Secondary | ICD-10-CM | POA: Diagnosis not present

## 2022-06-12 DIAGNOSIS — E7800 Pure hypercholesterolemia, unspecified: Secondary | ICD-10-CM | POA: Diagnosis not present

## 2022-06-12 DIAGNOSIS — N183 Chronic kidney disease, stage 3 unspecified: Secondary | ICD-10-CM | POA: Diagnosis not present

## 2022-06-12 DIAGNOSIS — I10 Essential (primary) hypertension: Secondary | ICD-10-CM | POA: Diagnosis not present

## 2022-07-07 DIAGNOSIS — H02051 Trichiasis without entropian right upper eyelid: Secondary | ICD-10-CM | POA: Diagnosis not present

## 2022-07-14 ENCOUNTER — Other Ambulatory Visit: Payer: Self-pay | Admitting: Internal Medicine

## 2022-07-14 DIAGNOSIS — H02831 Dermatochalasis of right upper eyelid: Secondary | ICD-10-CM | POA: Diagnosis not present

## 2022-07-14 DIAGNOSIS — H02132 Senile ectropion of right lower eyelid: Secondary | ICD-10-CM | POA: Diagnosis not present

## 2022-07-14 DIAGNOSIS — H02834 Dermatochalasis of left upper eyelid: Secondary | ICD-10-CM | POA: Diagnosis not present

## 2022-07-14 DIAGNOSIS — R918 Other nonspecific abnormal finding of lung field: Secondary | ICD-10-CM

## 2022-07-14 DIAGNOSIS — H02135 Senile ectropion of left lower eyelid: Secondary | ICD-10-CM | POA: Diagnosis not present

## 2022-08-07 DIAGNOSIS — Z23 Encounter for immunization: Secondary | ICD-10-CM | POA: Diagnosis not present

## 2022-08-29 DIAGNOSIS — J449 Chronic obstructive pulmonary disease, unspecified: Secondary | ICD-10-CM | POA: Diagnosis not present

## 2022-08-29 DIAGNOSIS — H02132 Senile ectropion of right lower eyelid: Secondary | ICD-10-CM | POA: Diagnosis not present

## 2022-08-29 DIAGNOSIS — Z8711 Personal history of peptic ulcer disease: Secondary | ICD-10-CM | POA: Diagnosis not present

## 2022-08-29 DIAGNOSIS — G4700 Insomnia, unspecified: Secondary | ICD-10-CM | POA: Diagnosis not present

## 2022-08-29 DIAGNOSIS — Z96659 Presence of unspecified artificial knee joint: Secondary | ICD-10-CM | POA: Diagnosis not present

## 2022-08-29 DIAGNOSIS — H02135 Senile ectropion of left lower eyelid: Secondary | ICD-10-CM | POA: Diagnosis not present

## 2022-08-29 DIAGNOSIS — I10 Essential (primary) hypertension: Secondary | ICD-10-CM | POA: Diagnosis not present

## 2022-08-29 DIAGNOSIS — Z85828 Personal history of other malignant neoplasm of skin: Secondary | ICD-10-CM | POA: Diagnosis not present

## 2022-08-29 DIAGNOSIS — E785 Hyperlipidemia, unspecified: Secondary | ICD-10-CM | POA: Diagnosis not present

## 2022-08-29 DIAGNOSIS — K219 Gastro-esophageal reflux disease without esophagitis: Secondary | ICD-10-CM | POA: Diagnosis not present

## 2022-08-29 DIAGNOSIS — M1990 Unspecified osteoarthritis, unspecified site: Secondary | ICD-10-CM | POA: Diagnosis not present

## 2022-09-04 DIAGNOSIS — K222 Esophageal obstruction: Secondary | ICD-10-CM | POA: Diagnosis not present

## 2022-09-04 DIAGNOSIS — Z79899 Other long term (current) drug therapy: Secondary | ICD-10-CM | POA: Diagnosis not present

## 2022-09-04 DIAGNOSIS — Q393 Congenital stenosis and stricture of esophagus: Secondary | ICD-10-CM | POA: Diagnosis not present

## 2022-09-04 DIAGNOSIS — Z7982 Long term (current) use of aspirin: Secondary | ICD-10-CM | POA: Diagnosis not present

## 2022-09-04 DIAGNOSIS — R1310 Dysphagia, unspecified: Secondary | ICD-10-CM | POA: Diagnosis not present

## 2022-09-10 DIAGNOSIS — Z7982 Long term (current) use of aspirin: Secondary | ICD-10-CM | POA: Diagnosis not present

## 2022-09-10 DIAGNOSIS — Z79899 Other long term (current) drug therapy: Secondary | ICD-10-CM | POA: Diagnosis not present

## 2022-09-10 DIAGNOSIS — E785 Hyperlipidemia, unspecified: Secondary | ICD-10-CM | POA: Diagnosis not present

## 2022-09-10 DIAGNOSIS — I10 Essential (primary) hypertension: Secondary | ICD-10-CM | POA: Diagnosis not present

## 2022-09-10 DIAGNOSIS — J449 Chronic obstructive pulmonary disease, unspecified: Secondary | ICD-10-CM | POA: Diagnosis not present

## 2022-09-10 DIAGNOSIS — R1310 Dysphagia, unspecified: Secondary | ICD-10-CM | POA: Diagnosis not present

## 2022-09-11 DIAGNOSIS — R1310 Dysphagia, unspecified: Secondary | ICD-10-CM | POA: Diagnosis not present

## 2022-09-11 DIAGNOSIS — N183 Chronic kidney disease, stage 3 unspecified: Secondary | ICD-10-CM | POA: Diagnosis not present

## 2022-09-11 DIAGNOSIS — E7800 Pure hypercholesterolemia, unspecified: Secondary | ICD-10-CM | POA: Diagnosis not present

## 2022-09-11 DIAGNOSIS — J449 Chronic obstructive pulmonary disease, unspecified: Secondary | ICD-10-CM | POA: Diagnosis not present

## 2022-09-11 DIAGNOSIS — I10 Essential (primary) hypertension: Secondary | ICD-10-CM | POA: Diagnosis not present

## 2022-09-12 DIAGNOSIS — E785 Hyperlipidemia, unspecified: Secondary | ICD-10-CM | POA: Diagnosis not present

## 2022-09-12 DIAGNOSIS — E878 Other disorders of electrolyte and fluid balance, not elsewhere classified: Secondary | ICD-10-CM | POA: Diagnosis not present

## 2022-09-12 DIAGNOSIS — E860 Dehydration: Secondary | ICD-10-CM | POA: Diagnosis not present

## 2022-09-12 DIAGNOSIS — E871 Hypo-osmolality and hyponatremia: Secondary | ICD-10-CM | POA: Diagnosis not present

## 2022-09-12 DIAGNOSIS — K220 Achalasia of cardia: Secondary | ICD-10-CM | POA: Diagnosis not present

## 2022-09-12 DIAGNOSIS — K222 Esophageal obstruction: Secondary | ICD-10-CM | POA: Diagnosis not present

## 2022-09-12 DIAGNOSIS — K2289 Other specified disease of esophagus: Secondary | ICD-10-CM | POA: Diagnosis not present

## 2022-09-12 DIAGNOSIS — I10 Essential (primary) hypertension: Secondary | ICD-10-CM | POA: Diagnosis not present

## 2022-09-12 DIAGNOSIS — K224 Dyskinesia of esophagus: Secondary | ICD-10-CM | POA: Diagnosis not present

## 2022-09-12 DIAGNOSIS — R1310 Dysphagia, unspecified: Secondary | ICD-10-CM | POA: Diagnosis not present

## 2022-09-12 DIAGNOSIS — Z7982 Long term (current) use of aspirin: Secondary | ICD-10-CM | POA: Diagnosis not present

## 2022-09-12 DIAGNOSIS — Z79899 Other long term (current) drug therapy: Secondary | ICD-10-CM | POA: Diagnosis not present

## 2022-09-12 DIAGNOSIS — Z87891 Personal history of nicotine dependence: Secondary | ICD-10-CM | POA: Diagnosis not present

## 2022-09-12 DIAGNOSIS — K219 Gastro-esophageal reflux disease without esophagitis: Secondary | ICD-10-CM | POA: Diagnosis not present

## 2022-09-12 DIAGNOSIS — K5900 Constipation, unspecified: Secondary | ICD-10-CM | POA: Diagnosis not present

## 2022-09-12 DIAGNOSIS — Z87898 Personal history of other specified conditions: Secondary | ICD-10-CM | POA: Diagnosis not present

## 2022-09-12 DIAGNOSIS — J449 Chronic obstructive pulmonary disease, unspecified: Secondary | ICD-10-CM | POA: Diagnosis not present

## 2022-09-13 DIAGNOSIS — K219 Gastro-esophageal reflux disease without esophagitis: Secondary | ICD-10-CM | POA: Diagnosis not present

## 2022-09-13 DIAGNOSIS — R918 Other nonspecific abnormal finding of lung field: Secondary | ICD-10-CM | POA: Diagnosis not present

## 2022-09-13 DIAGNOSIS — E878 Other disorders of electrolyte and fluid balance, not elsewhere classified: Secondary | ICD-10-CM | POA: Diagnosis not present

## 2022-09-13 DIAGNOSIS — I10 Essential (primary) hypertension: Secondary | ICD-10-CM | POA: Diagnosis not present

## 2022-09-13 DIAGNOSIS — K222 Esophageal obstruction: Secondary | ICD-10-CM | POA: Diagnosis not present

## 2022-09-13 DIAGNOSIS — K2289 Other specified disease of esophagus: Secondary | ICD-10-CM | POA: Diagnosis not present

## 2022-09-13 DIAGNOSIS — E785 Hyperlipidemia, unspecified: Secondary | ICD-10-CM | POA: Diagnosis not present

## 2022-09-13 DIAGNOSIS — K311 Adult hypertrophic pyloric stenosis: Secondary | ICD-10-CM | POA: Diagnosis not present

## 2022-09-13 DIAGNOSIS — K5900 Constipation, unspecified: Secondary | ICD-10-CM | POA: Diagnosis not present

## 2022-09-13 DIAGNOSIS — E860 Dehydration: Secondary | ICD-10-CM | POA: Diagnosis not present

## 2022-09-13 DIAGNOSIS — K220 Achalasia of cardia: Secondary | ICD-10-CM | POA: Diagnosis not present

## 2022-09-13 DIAGNOSIS — K224 Dyskinesia of esophagus: Secondary | ICD-10-CM | POA: Diagnosis not present

## 2022-09-13 DIAGNOSIS — R1310 Dysphagia, unspecified: Secondary | ICD-10-CM | POA: Diagnosis not present

## 2022-09-13 DIAGNOSIS — E871 Hypo-osmolality and hyponatremia: Secondary | ICD-10-CM | POA: Diagnosis not present

## 2022-09-14 DIAGNOSIS — K5900 Constipation, unspecified: Secondary | ICD-10-CM | POA: Diagnosis not present

## 2022-09-14 DIAGNOSIS — E871 Hypo-osmolality and hyponatremia: Secondary | ICD-10-CM | POA: Diagnosis not present

## 2022-09-14 DIAGNOSIS — I10 Essential (primary) hypertension: Secondary | ICD-10-CM | POA: Diagnosis not present

## 2022-09-14 DIAGNOSIS — R1310 Dysphagia, unspecified: Secondary | ICD-10-CM | POA: Diagnosis not present

## 2022-09-14 DIAGNOSIS — K222 Esophageal obstruction: Secondary | ICD-10-CM | POA: Diagnosis not present

## 2022-09-14 DIAGNOSIS — E785 Hyperlipidemia, unspecified: Secondary | ICD-10-CM | POA: Diagnosis not present

## 2022-09-15 DIAGNOSIS — I10 Essential (primary) hypertension: Secondary | ICD-10-CM | POA: Diagnosis not present

## 2022-09-15 DIAGNOSIS — K222 Esophageal obstruction: Secondary | ICD-10-CM | POA: Diagnosis not present

## 2022-09-15 DIAGNOSIS — E871 Hypo-osmolality and hyponatremia: Secondary | ICD-10-CM | POA: Diagnosis not present

## 2022-09-15 DIAGNOSIS — K5900 Constipation, unspecified: Secondary | ICD-10-CM | POA: Diagnosis not present

## 2022-09-15 DIAGNOSIS — R1310 Dysphagia, unspecified: Secondary | ICD-10-CM | POA: Diagnosis not present

## 2022-09-15 DIAGNOSIS — K2289 Other specified disease of esophagus: Secondary | ICD-10-CM | POA: Diagnosis not present

## 2022-09-15 DIAGNOSIS — K224 Dyskinesia of esophagus: Secondary | ICD-10-CM | POA: Diagnosis not present

## 2022-09-15 DIAGNOSIS — E785 Hyperlipidemia, unspecified: Secondary | ICD-10-CM | POA: Diagnosis not present

## 2022-09-16 DIAGNOSIS — K5900 Constipation, unspecified: Secondary | ICD-10-CM | POA: Diagnosis not present

## 2022-09-16 DIAGNOSIS — R1310 Dysphagia, unspecified: Secondary | ICD-10-CM | POA: Diagnosis not present

## 2022-09-16 DIAGNOSIS — K224 Dyskinesia of esophagus: Secondary | ICD-10-CM | POA: Diagnosis not present

## 2022-09-16 DIAGNOSIS — E785 Hyperlipidemia, unspecified: Secondary | ICD-10-CM | POA: Diagnosis not present

## 2022-09-16 DIAGNOSIS — K222 Esophageal obstruction: Secondary | ICD-10-CM | POA: Diagnosis not present

## 2022-09-16 DIAGNOSIS — I10 Essential (primary) hypertension: Secondary | ICD-10-CM | POA: Diagnosis not present

## 2022-09-17 DIAGNOSIS — K5900 Constipation, unspecified: Secondary | ICD-10-CM | POA: Diagnosis not present

## 2022-09-17 DIAGNOSIS — E785 Hyperlipidemia, unspecified: Secondary | ICD-10-CM | POA: Diagnosis not present

## 2022-09-17 DIAGNOSIS — R1310 Dysphagia, unspecified: Secondary | ICD-10-CM | POA: Diagnosis not present

## 2022-09-17 DIAGNOSIS — I10 Essential (primary) hypertension: Secondary | ICD-10-CM | POA: Diagnosis not present

## 2022-09-17 DIAGNOSIS — K224 Dyskinesia of esophagus: Secondary | ICD-10-CM | POA: Diagnosis not present

## 2022-09-18 DIAGNOSIS — R1310 Dysphagia, unspecified: Secondary | ICD-10-CM | POA: Diagnosis not present

## 2022-09-18 DIAGNOSIS — K224 Dyskinesia of esophagus: Secondary | ICD-10-CM | POA: Diagnosis not present

## 2022-09-18 DIAGNOSIS — I10 Essential (primary) hypertension: Secondary | ICD-10-CM | POA: Diagnosis not present

## 2022-09-18 DIAGNOSIS — K5900 Constipation, unspecified: Secondary | ICD-10-CM | POA: Diagnosis not present

## 2022-09-18 DIAGNOSIS — E785 Hyperlipidemia, unspecified: Secondary | ICD-10-CM | POA: Diagnosis not present

## 2022-09-19 DIAGNOSIS — E785 Hyperlipidemia, unspecified: Secondary | ICD-10-CM | POA: Diagnosis not present

## 2022-09-19 DIAGNOSIS — K311 Adult hypertrophic pyloric stenosis: Secondary | ICD-10-CM | POA: Diagnosis not present

## 2022-09-19 DIAGNOSIS — R1310 Dysphagia, unspecified: Secondary | ICD-10-CM | POA: Diagnosis not present

## 2022-09-19 DIAGNOSIS — R918 Other nonspecific abnormal finding of lung field: Secondary | ICD-10-CM | POA: Diagnosis not present

## 2022-09-19 DIAGNOSIS — K224 Dyskinesia of esophagus: Secondary | ICD-10-CM | POA: Diagnosis not present

## 2022-09-24 DIAGNOSIS — K223 Perforation of esophagus: Secondary | ICD-10-CM | POA: Diagnosis not present

## 2022-09-24 DIAGNOSIS — Z7982 Long term (current) use of aspirin: Secondary | ICD-10-CM | POA: Diagnosis not present

## 2022-09-24 DIAGNOSIS — K5900 Constipation, unspecified: Secondary | ICD-10-CM | POA: Diagnosis not present

## 2022-09-24 DIAGNOSIS — K224 Dyskinesia of esophagus: Secondary | ICD-10-CM | POA: Diagnosis not present

## 2022-09-24 DIAGNOSIS — K219 Gastro-esophageal reflux disease without esophagitis: Secondary | ICD-10-CM | POA: Diagnosis not present

## 2022-09-24 DIAGNOSIS — Z888 Allergy status to other drugs, medicaments and biological substances status: Secondary | ICD-10-CM | POA: Diagnosis not present

## 2022-09-24 DIAGNOSIS — J449 Chronic obstructive pulmonary disease, unspecified: Secondary | ICD-10-CM | POA: Diagnosis not present

## 2022-09-24 DIAGNOSIS — Z7951 Long term (current) use of inhaled steroids: Secondary | ICD-10-CM | POA: Diagnosis not present

## 2022-09-24 DIAGNOSIS — K9189 Other postprocedural complications and disorders of digestive system: Secondary | ICD-10-CM | POA: Diagnosis not present

## 2022-09-24 DIAGNOSIS — J982 Interstitial emphysema: Secondary | ICD-10-CM | POA: Diagnosis not present

## 2022-09-24 DIAGNOSIS — J90 Pleural effusion, not elsewhere classified: Secondary | ICD-10-CM | POA: Diagnosis not present

## 2022-09-24 DIAGNOSIS — Z79899 Other long term (current) drug therapy: Secondary | ICD-10-CM | POA: Diagnosis not present

## 2022-09-24 DIAGNOSIS — I10 Essential (primary) hypertension: Secondary | ICD-10-CM | POA: Diagnosis not present

## 2022-09-24 DIAGNOSIS — R1314 Dysphagia, pharyngoesophageal phase: Secondary | ICD-10-CM | POA: Diagnosis not present

## 2022-09-24 DIAGNOSIS — E785 Hyperlipidemia, unspecified: Secondary | ICD-10-CM | POA: Diagnosis not present

## 2022-09-24 DIAGNOSIS — Z881 Allergy status to other antibiotic agents status: Secondary | ICD-10-CM | POA: Diagnosis not present

## 2022-09-24 DIAGNOSIS — K220 Achalasia of cardia: Secondary | ICD-10-CM | POA: Diagnosis not present

## 2022-09-24 DIAGNOSIS — R1310 Dysphagia, unspecified: Secondary | ICD-10-CM | POA: Diagnosis not present

## 2022-09-25 DIAGNOSIS — Z7982 Long term (current) use of aspirin: Secondary | ICD-10-CM | POA: Diagnosis not present

## 2022-09-25 DIAGNOSIS — R140 Abdominal distension (gaseous): Secondary | ICD-10-CM | POA: Diagnosis not present

## 2022-09-25 DIAGNOSIS — R5381 Other malaise: Secondary | ICD-10-CM | POA: Diagnosis not present

## 2022-09-25 DIAGNOSIS — R197 Diarrhea, unspecified: Secondary | ICD-10-CM | POA: Diagnosis not present

## 2022-09-25 DIAGNOSIS — E785 Hyperlipidemia, unspecified: Secondary | ICD-10-CM | POA: Diagnosis not present

## 2022-09-25 DIAGNOSIS — J982 Interstitial emphysema: Secondary | ICD-10-CM | POA: Diagnosis not present

## 2022-09-25 DIAGNOSIS — K220 Achalasia of cardia: Secondary | ICD-10-CM | POA: Diagnosis not present

## 2022-09-25 DIAGNOSIS — I10 Essential (primary) hypertension: Secondary | ICD-10-CM | POA: Diagnosis not present

## 2022-09-25 DIAGNOSIS — J90 Pleural effusion, not elsewhere classified: Secondary | ICD-10-CM | POA: Diagnosis not present

## 2022-09-25 DIAGNOSIS — R918 Other nonspecific abnormal finding of lung field: Secondary | ICD-10-CM | POA: Diagnosis not present

## 2022-09-25 DIAGNOSIS — Z9889 Other specified postprocedural states: Secondary | ICD-10-CM | POA: Diagnosis not present

## 2022-09-25 DIAGNOSIS — Z881 Allergy status to other antibiotic agents status: Secondary | ICD-10-CM | POA: Diagnosis not present

## 2022-09-25 DIAGNOSIS — R6889 Other general symptoms and signs: Secondary | ICD-10-CM | POA: Diagnosis not present

## 2022-09-25 DIAGNOSIS — R0789 Other chest pain: Secondary | ICD-10-CM | POA: Diagnosis not present

## 2022-09-25 DIAGNOSIS — K5900 Constipation, unspecified: Secondary | ICD-10-CM | POA: Diagnosis not present

## 2022-09-25 DIAGNOSIS — R1314 Dysphagia, pharyngoesophageal phase: Secondary | ICD-10-CM | POA: Diagnosis not present

## 2022-09-25 DIAGNOSIS — K223 Perforation of esophagus: Secondary | ICD-10-CM | POA: Diagnosis not present

## 2022-09-25 DIAGNOSIS — K224 Dyskinesia of esophagus: Secondary | ICD-10-CM | POA: Diagnosis not present

## 2022-09-25 DIAGNOSIS — Z743 Need for continuous supervision: Secondary | ICD-10-CM | POA: Diagnosis not present

## 2022-09-25 DIAGNOSIS — J9811 Atelectasis: Secondary | ICD-10-CM | POA: Diagnosis not present

## 2022-09-25 DIAGNOSIS — R1310 Dysphagia, unspecified: Secondary | ICD-10-CM | POA: Diagnosis not present

## 2022-09-25 DIAGNOSIS — K219 Gastro-esophageal reflux disease without esophagitis: Secondary | ICD-10-CM | POA: Diagnosis not present

## 2022-09-25 DIAGNOSIS — R079 Chest pain, unspecified: Secondary | ICD-10-CM | POA: Diagnosis not present

## 2022-09-25 DIAGNOSIS — Z888 Allergy status to other drugs, medicaments and biological substances status: Secondary | ICD-10-CM | POA: Diagnosis not present

## 2022-09-25 DIAGNOSIS — Z79899 Other long term (current) drug therapy: Secondary | ICD-10-CM | POA: Diagnosis not present

## 2022-09-25 DIAGNOSIS — K9189 Other postprocedural complications and disorders of digestive system: Secondary | ICD-10-CM | POA: Diagnosis not present

## 2022-09-25 DIAGNOSIS — Z7951 Long term (current) use of inhaled steroids: Secondary | ICD-10-CM | POA: Diagnosis not present

## 2022-09-25 DIAGNOSIS — J449 Chronic obstructive pulmonary disease, unspecified: Secondary | ICD-10-CM | POA: Diagnosis not present

## 2022-09-25 DIAGNOSIS — K668 Other specified disorders of peritoneum: Secondary | ICD-10-CM | POA: Diagnosis not present

## 2022-09-25 DIAGNOSIS — K449 Diaphragmatic hernia without obstruction or gangrene: Secondary | ICD-10-CM | POA: Diagnosis not present

## 2022-09-25 DIAGNOSIS — R109 Unspecified abdominal pain: Secondary | ICD-10-CM | POA: Diagnosis not present

## 2022-09-25 DIAGNOSIS — R1319 Other dysphagia: Secondary | ICD-10-CM | POA: Diagnosis not present

## 2022-09-26 DIAGNOSIS — K220 Achalasia of cardia: Secondary | ICD-10-CM | POA: Diagnosis not present

## 2022-09-26 DIAGNOSIS — R1319 Other dysphagia: Secondary | ICD-10-CM | POA: Diagnosis not present

## 2022-09-26 DIAGNOSIS — E785 Hyperlipidemia, unspecified: Secondary | ICD-10-CM | POA: Diagnosis not present

## 2022-09-26 DIAGNOSIS — I10 Essential (primary) hypertension: Secondary | ICD-10-CM | POA: Diagnosis not present

## 2022-09-26 DIAGNOSIS — J449 Chronic obstructive pulmonary disease, unspecified: Secondary | ICD-10-CM | POA: Diagnosis not present

## 2022-09-28 DIAGNOSIS — J982 Interstitial emphysema: Secondary | ICD-10-CM | POA: Diagnosis not present

## 2022-09-28 DIAGNOSIS — R140 Abdominal distension (gaseous): Secondary | ICD-10-CM | POA: Diagnosis not present

## 2022-09-28 DIAGNOSIS — R5381 Other malaise: Secondary | ICD-10-CM | POA: Diagnosis not present

## 2022-09-28 DIAGNOSIS — K223 Perforation of esophagus: Secondary | ICD-10-CM | POA: Diagnosis not present

## 2022-09-28 DIAGNOSIS — R0789 Other chest pain: Secondary | ICD-10-CM | POA: Diagnosis not present

## 2022-09-28 DIAGNOSIS — K668 Other specified disorders of peritoneum: Secondary | ICD-10-CM | POA: Diagnosis not present

## 2022-09-28 DIAGNOSIS — R079 Chest pain, unspecified: Secondary | ICD-10-CM | POA: Diagnosis not present

## 2022-09-28 DIAGNOSIS — R918 Other nonspecific abnormal finding of lung field: Secondary | ICD-10-CM | POA: Diagnosis not present

## 2022-09-28 DIAGNOSIS — K449 Diaphragmatic hernia without obstruction or gangrene: Secondary | ICD-10-CM | POA: Diagnosis not present

## 2022-09-28 DIAGNOSIS — J9811 Atelectasis: Secondary | ICD-10-CM | POA: Diagnosis not present

## 2022-09-28 DIAGNOSIS — J90 Pleural effusion, not elsewhere classified: Secondary | ICD-10-CM | POA: Diagnosis not present

## 2022-09-28 DIAGNOSIS — R109 Unspecified abdominal pain: Secondary | ICD-10-CM | POA: Diagnosis not present

## 2022-09-29 DIAGNOSIS — K668 Other specified disorders of peritoneum: Secondary | ICD-10-CM | POA: Diagnosis not present

## 2022-09-29 DIAGNOSIS — K220 Achalasia of cardia: Secondary | ICD-10-CM | POA: Diagnosis not present

## 2022-09-29 DIAGNOSIS — J982 Interstitial emphysema: Secondary | ICD-10-CM | POA: Diagnosis not present

## 2022-09-30 DIAGNOSIS — Z9889 Other specified postprocedural states: Secondary | ICD-10-CM | POA: Diagnosis not present

## 2022-09-30 DIAGNOSIS — R197 Diarrhea, unspecified: Secondary | ICD-10-CM | POA: Diagnosis not present

## 2022-09-30 DIAGNOSIS — K220 Achalasia of cardia: Secondary | ICD-10-CM | POA: Diagnosis not present

## 2022-09-30 DIAGNOSIS — K668 Other specified disorders of peritoneum: Secondary | ICD-10-CM | POA: Diagnosis not present

## 2022-09-30 DIAGNOSIS — J982 Interstitial emphysema: Secondary | ICD-10-CM | POA: Diagnosis not present

## 2022-10-01 DIAGNOSIS — R197 Diarrhea, unspecified: Secondary | ICD-10-CM | POA: Diagnosis not present

## 2022-10-01 DIAGNOSIS — K223 Perforation of esophagus: Secondary | ICD-10-CM | POA: Diagnosis not present

## 2022-10-01 DIAGNOSIS — K668 Other specified disorders of peritoneum: Secondary | ICD-10-CM | POA: Diagnosis not present

## 2022-10-01 DIAGNOSIS — Z9889 Other specified postprocedural states: Secondary | ICD-10-CM | POA: Diagnosis not present

## 2022-10-01 DIAGNOSIS — K220 Achalasia of cardia: Secondary | ICD-10-CM | POA: Diagnosis not present

## 2022-10-04 DIAGNOSIS — Z9889 Other specified postprocedural states: Secondary | ICD-10-CM | POA: Diagnosis not present

## 2022-10-04 DIAGNOSIS — I10 Essential (primary) hypertension: Secondary | ICD-10-CM | POA: Diagnosis not present

## 2022-10-04 DIAGNOSIS — F17211 Nicotine dependence, cigarettes, in remission: Secondary | ICD-10-CM | POA: Diagnosis not present

## 2022-10-04 DIAGNOSIS — R918 Other nonspecific abnormal finding of lung field: Secondary | ICD-10-CM | POA: Diagnosis not present

## 2022-10-04 DIAGNOSIS — K4090 Unilateral inguinal hernia, without obstruction or gangrene, not specified as recurrent: Secondary | ICD-10-CM | POA: Diagnosis not present

## 2022-10-04 DIAGNOSIS — J449 Chronic obstructive pulmonary disease, unspecified: Secondary | ICD-10-CM | POA: Diagnosis not present

## 2022-10-04 DIAGNOSIS — K5730 Diverticulosis of large intestine without perforation or abscess without bleeding: Secondary | ICD-10-CM | POA: Diagnosis not present

## 2022-10-04 DIAGNOSIS — R6889 Other general symptoms and signs: Secondary | ICD-10-CM | POA: Diagnosis not present

## 2022-10-04 DIAGNOSIS — R160 Hepatomegaly, not elsewhere classified: Secondary | ICD-10-CM | POA: Diagnosis not present

## 2022-10-04 DIAGNOSIS — Z743 Need for continuous supervision: Secondary | ICD-10-CM | POA: Diagnosis not present

## 2022-10-04 DIAGNOSIS — J984 Other disorders of lung: Secondary | ICD-10-CM | POA: Diagnosis not present

## 2022-10-04 DIAGNOSIS — K209 Esophagitis, unspecified without bleeding: Secondary | ICD-10-CM | POA: Diagnosis not present

## 2022-10-14 DIAGNOSIS — R0789 Other chest pain: Secondary | ICD-10-CM | POA: Diagnosis not present

## 2022-10-14 DIAGNOSIS — R1013 Epigastric pain: Secondary | ICD-10-CM | POA: Diagnosis not present

## 2022-10-14 DIAGNOSIS — I10 Essential (primary) hypertension: Secondary | ICD-10-CM | POA: Diagnosis not present

## 2022-10-14 DIAGNOSIS — E871 Hypo-osmolality and hyponatremia: Secondary | ICD-10-CM | POA: Diagnosis not present

## 2022-10-17 DIAGNOSIS — I10 Essential (primary) hypertension: Secondary | ICD-10-CM | POA: Diagnosis not present

## 2022-10-19 DIAGNOSIS — R1013 Epigastric pain: Secondary | ICD-10-CM | POA: Diagnosis not present

## 2022-10-19 DIAGNOSIS — E871 Hypo-osmolality and hyponatremia: Secondary | ICD-10-CM | POA: Diagnosis not present

## 2022-10-21 DIAGNOSIS — K220 Achalasia of cardia: Secondary | ICD-10-CM | POA: Diagnosis not present

## 2022-10-22 DIAGNOSIS — K5900 Constipation, unspecified: Secondary | ICD-10-CM | POA: Diagnosis not present

## 2022-10-22 DIAGNOSIS — I10 Essential (primary) hypertension: Secondary | ICD-10-CM | POA: Diagnosis not present

## 2022-10-23 DIAGNOSIS — I10 Essential (primary) hypertension: Secondary | ICD-10-CM | POA: Diagnosis not present

## 2022-10-23 DIAGNOSIS — K5900 Constipation, unspecified: Secondary | ICD-10-CM | POA: Diagnosis not present

## 2022-10-23 DIAGNOSIS — Z87891 Personal history of nicotine dependence: Secondary | ICD-10-CM | POA: Diagnosis not present

## 2022-10-23 DIAGNOSIS — R140 Abdominal distension (gaseous): Secondary | ICD-10-CM | POA: Diagnosis not present

## 2022-10-23 DIAGNOSIS — Z7689 Persons encountering health services in other specified circumstances: Secondary | ICD-10-CM | POA: Diagnosis not present

## 2022-10-27 DIAGNOSIS — M47812 Spondylosis without myelopathy or radiculopathy, cervical region: Secondary | ICD-10-CM | POA: Diagnosis not present

## 2022-10-27 DIAGNOSIS — M542 Cervicalgia: Secondary | ICD-10-CM | POA: Diagnosis not present

## 2022-10-27 DIAGNOSIS — M5030 Other cervical disc degeneration, unspecified cervical region: Secondary | ICD-10-CM | POA: Diagnosis not present

## 2022-11-03 DIAGNOSIS — R194 Change in bowel habit: Secondary | ICD-10-CM | POA: Diagnosis not present

## 2022-11-03 DIAGNOSIS — K220 Achalasia of cardia: Secondary | ICD-10-CM | POA: Diagnosis not present

## 2022-11-04 DIAGNOSIS — L989 Disorder of the skin and subcutaneous tissue, unspecified: Secondary | ICD-10-CM | POA: Diagnosis not present

## 2022-11-04 DIAGNOSIS — S80872A Other superficial bite, left lower leg, initial encounter: Secondary | ICD-10-CM | POA: Diagnosis not present

## 2022-11-05 DIAGNOSIS — H9201 Otalgia, right ear: Secondary | ICD-10-CM | POA: Diagnosis not present

## 2022-11-14 DIAGNOSIS — M50323 Other cervical disc degeneration at C6-C7 level: Secondary | ICD-10-CM | POA: Diagnosis not present

## 2022-11-14 DIAGNOSIS — M50321 Other cervical disc degeneration at C4-C5 level: Secondary | ICD-10-CM | POA: Diagnosis not present

## 2022-11-14 DIAGNOSIS — M50322 Other cervical disc degeneration at C5-C6 level: Secondary | ICD-10-CM | POA: Diagnosis not present

## 2022-11-14 DIAGNOSIS — M5031 Other cervical disc degeneration,  high cervical region: Secondary | ICD-10-CM | POA: Diagnosis not present

## 2022-11-14 DIAGNOSIS — M542 Cervicalgia: Secondary | ICD-10-CM | POA: Diagnosis not present

## 2022-11-20 DIAGNOSIS — M542 Cervicalgia: Secondary | ICD-10-CM | POA: Diagnosis not present

## 2022-11-22 DIAGNOSIS — G629 Polyneuropathy, unspecified: Secondary | ICD-10-CM | POA: Diagnosis not present

## 2022-11-25 DIAGNOSIS — R208 Other disturbances of skin sensation: Secondary | ICD-10-CM | POA: Diagnosis not present

## 2022-11-26 DIAGNOSIS — G629 Polyneuropathy, unspecified: Secondary | ICD-10-CM | POA: Diagnosis not present

## 2022-11-27 DIAGNOSIS — S0502XA Injury of conjunctiva and corneal abrasion without foreign body, left eye, initial encounter: Secondary | ICD-10-CM | POA: Diagnosis not present

## 2022-11-28 DIAGNOSIS — I498 Other specified cardiac arrhythmias: Secondary | ICD-10-CM | POA: Diagnosis not present

## 2022-11-28 DIAGNOSIS — H9313 Tinnitus, bilateral: Secondary | ICD-10-CM | POA: Diagnosis not present

## 2022-11-28 DIAGNOSIS — H9311 Tinnitus, right ear: Secondary | ICD-10-CM | POA: Diagnosis not present

## 2022-12-03 DIAGNOSIS — H35363 Drusen (degenerative) of macula, bilateral: Secondary | ICD-10-CM | POA: Diagnosis not present

## 2022-12-03 DIAGNOSIS — H43313 Vitreous membranes and strands, bilateral: Secondary | ICD-10-CM | POA: Diagnosis not present

## 2022-12-03 DIAGNOSIS — S0502XD Injury of conjunctiva and corneal abrasion without foreign body, left eye, subsequent encounter: Secondary | ICD-10-CM | POA: Diagnosis not present

## 2022-12-03 DIAGNOSIS — H16142 Punctate keratitis, left eye: Secondary | ICD-10-CM | POA: Diagnosis not present

## 2022-12-06 DIAGNOSIS — I10 Essential (primary) hypertension: Secondary | ICD-10-CM | POA: Diagnosis not present

## 2022-12-06 DIAGNOSIS — H9311 Tinnitus, right ear: Secondary | ICD-10-CM | POA: Diagnosis not present

## 2022-12-06 DIAGNOSIS — H9313 Tinnitus, bilateral: Secondary | ICD-10-CM | POA: Diagnosis not present

## 2022-12-10 DIAGNOSIS — H11433 Conjunctival hyperemia, bilateral: Secondary | ICD-10-CM | POA: Diagnosis not present

## 2022-12-10 DIAGNOSIS — H35363 Drusen (degenerative) of macula, bilateral: Secondary | ICD-10-CM | POA: Diagnosis not present

## 2022-12-10 DIAGNOSIS — H11422 Conjunctival edema, left eye: Secondary | ICD-10-CM | POA: Diagnosis not present

## 2022-12-10 DIAGNOSIS — H16142 Punctate keratitis, left eye: Secondary | ICD-10-CM | POA: Diagnosis not present

## 2022-12-12 DIAGNOSIS — I10 Essential (primary) hypertension: Secondary | ICD-10-CM | POA: Diagnosis not present

## 2022-12-12 DIAGNOSIS — H9313 Tinnitus, bilateral: Secondary | ICD-10-CM | POA: Diagnosis not present

## 2022-12-18 DIAGNOSIS — S0502XD Injury of conjunctiva and corneal abrasion without foreign body, left eye, subsequent encounter: Secondary | ICD-10-CM | POA: Diagnosis not present

## 2022-12-19 DIAGNOSIS — Z01118 Encounter for examination of ears and hearing with other abnormal findings: Secondary | ICD-10-CM | POA: Diagnosis not present

## 2022-12-19 DIAGNOSIS — H903 Sensorineural hearing loss, bilateral: Secondary | ICD-10-CM | POA: Diagnosis not present

## 2022-12-22 DIAGNOSIS — S0502XD Injury of conjunctiva and corneal abrasion without foreign body, left eye, subsequent encounter: Secondary | ICD-10-CM | POA: Diagnosis not present

## 2022-12-25 DIAGNOSIS — S0502XD Injury of conjunctiva and corneal abrasion without foreign body, left eye, subsequent encounter: Secondary | ICD-10-CM | POA: Diagnosis not present

## 2022-12-25 DIAGNOSIS — H16141 Punctate keratitis, right eye: Secondary | ICD-10-CM | POA: Diagnosis not present

## 2023-01-12 DIAGNOSIS — S0502XD Injury of conjunctiva and corneal abrasion without foreign body, left eye, subsequent encounter: Secondary | ICD-10-CM | POA: Diagnosis not present

## 2023-01-12 DIAGNOSIS — H16143 Punctate keratitis, bilateral: Secondary | ICD-10-CM | POA: Diagnosis not present

## 2023-01-15 DIAGNOSIS — S0502XD Injury of conjunctiva and corneal abrasion without foreign body, left eye, subsequent encounter: Secondary | ICD-10-CM | POA: Diagnosis not present

## 2023-01-15 DIAGNOSIS — S0501XS Injury of conjunctiva and corneal abrasion without foreign body, right eye, sequela: Secondary | ICD-10-CM | POA: Diagnosis not present

## 2023-01-16 DIAGNOSIS — H04123 Dry eye syndrome of bilateral lacrimal glands: Secondary | ICD-10-CM | POA: Diagnosis not present

## 2023-01-16 DIAGNOSIS — S0501XS Injury of conjunctiva and corneal abrasion without foreign body, right eye, sequela: Secondary | ICD-10-CM | POA: Diagnosis not present

## 2023-01-16 DIAGNOSIS — S0502XD Injury of conjunctiva and corneal abrasion without foreign body, left eye, subsequent encounter: Secondary | ICD-10-CM | POA: Diagnosis not present

## 2023-01-27 DIAGNOSIS — K220 Achalasia of cardia: Secondary | ICD-10-CM | POA: Diagnosis not present

## 2023-01-29 DIAGNOSIS — H02883 Meibomian gland dysfunction of right eye, unspecified eyelid: Secondary | ICD-10-CM | POA: Diagnosis not present

## 2023-01-29 DIAGNOSIS — H04123 Dry eye syndrome of bilateral lacrimal glands: Secondary | ICD-10-CM | POA: Diagnosis not present

## 2023-01-29 DIAGNOSIS — H02886 Meibomian gland dysfunction of left eye, unspecified eyelid: Secondary | ICD-10-CM | POA: Diagnosis not present

## 2023-01-31 DIAGNOSIS — R1013 Epigastric pain: Secondary | ICD-10-CM | POA: Diagnosis not present

## 2023-01-31 DIAGNOSIS — K219 Gastro-esophageal reflux disease without esophagitis: Secondary | ICD-10-CM | POA: Diagnosis not present

## 2023-01-31 DIAGNOSIS — R059 Cough, unspecified: Secondary | ICD-10-CM | POA: Diagnosis not present

## 2023-01-31 DIAGNOSIS — R110 Nausea: Secondary | ICD-10-CM | POA: Diagnosis not present

## 2023-03-09 DIAGNOSIS — R6889 Other general symptoms and signs: Secondary | ICD-10-CM | POA: Diagnosis not present

## 2023-03-09 DIAGNOSIS — R109 Unspecified abdominal pain: Secondary | ICD-10-CM | POA: Diagnosis not present

## 2023-03-09 DIAGNOSIS — R110 Nausea: Secondary | ICD-10-CM | POA: Diagnosis not present

## 2023-03-09 DIAGNOSIS — Z743 Need for continuous supervision: Secondary | ICD-10-CM | POA: Diagnosis not present

## 2023-03-18 DIAGNOSIS — I10 Essential (primary) hypertension: Secondary | ICD-10-CM | POA: Diagnosis not present

## 2023-03-18 DIAGNOSIS — K219 Gastro-esophageal reflux disease without esophagitis: Secondary | ICD-10-CM | POA: Diagnosis not present

## 2023-03-18 DIAGNOSIS — K220 Achalasia of cardia: Secondary | ICD-10-CM | POA: Diagnosis not present

## 2023-03-18 DIAGNOSIS — Z91199 Patient's noncompliance with other medical treatment and regimen due to unspecified reason: Secondary | ICD-10-CM | POA: Diagnosis not present

## 2023-03-18 DIAGNOSIS — Z9889 Other specified postprocedural states: Secondary | ICD-10-CM | POA: Diagnosis not present

## 2023-03-18 DIAGNOSIS — E785 Hyperlipidemia, unspecified: Secondary | ICD-10-CM | POA: Diagnosis not present

## 2023-03-18 DIAGNOSIS — M1990 Unspecified osteoarthritis, unspecified site: Secondary | ICD-10-CM | POA: Diagnosis not present

## 2023-03-28 DIAGNOSIS — R109 Unspecified abdominal pain: Secondary | ICD-10-CM | POA: Diagnosis not present

## 2023-03-28 DIAGNOSIS — R1011 Right upper quadrant pain: Secondary | ICD-10-CM | POA: Diagnosis not present

## 2023-03-28 DIAGNOSIS — R1084 Generalized abdominal pain: Secondary | ICD-10-CM | POA: Diagnosis not present

## 2023-03-28 DIAGNOSIS — J449 Chronic obstructive pulmonary disease, unspecified: Secondary | ICD-10-CM | POA: Diagnosis not present

## 2023-03-28 DIAGNOSIS — R110 Nausea: Secondary | ICD-10-CM | POA: Diagnosis not present

## 2023-03-28 DIAGNOSIS — K219 Gastro-esophageal reflux disease without esophagitis: Secondary | ICD-10-CM | POA: Diagnosis not present

## 2023-03-28 DIAGNOSIS — R1013 Epigastric pain: Secondary | ICD-10-CM | POA: Diagnosis not present

## 2023-03-28 DIAGNOSIS — I10 Essential (primary) hypertension: Secondary | ICD-10-CM | POA: Diagnosis not present

## 2023-03-28 DIAGNOSIS — Z79899 Other long term (current) drug therapy: Secondary | ICD-10-CM | POA: Diagnosis not present

## 2023-04-02 DIAGNOSIS — D124 Benign neoplasm of descending colon: Secondary | ICD-10-CM | POA: Diagnosis not present

## 2023-04-02 DIAGNOSIS — D123 Benign neoplasm of transverse colon: Secondary | ICD-10-CM | POA: Diagnosis not present

## 2023-04-02 DIAGNOSIS — K648 Other hemorrhoids: Secondary | ICD-10-CM | POA: Diagnosis not present

## 2023-04-02 DIAGNOSIS — D125 Benign neoplasm of sigmoid colon: Secondary | ICD-10-CM | POA: Diagnosis not present

## 2023-04-02 DIAGNOSIS — R194 Change in bowel habit: Secondary | ICD-10-CM | POA: Diagnosis not present

## 2023-04-02 DIAGNOSIS — K635 Polyp of colon: Secondary | ICD-10-CM | POA: Diagnosis not present

## 2023-04-02 DIAGNOSIS — Z1211 Encounter for screening for malignant neoplasm of colon: Secondary | ICD-10-CM | POA: Diagnosis not present

## 2023-04-06 DIAGNOSIS — R110 Nausea: Secondary | ICD-10-CM | POA: Diagnosis not present

## 2023-04-06 DIAGNOSIS — K649 Unspecified hemorrhoids: Secondary | ICD-10-CM | POA: Diagnosis not present

## 2023-04-09 ENCOUNTER — Telehealth: Payer: Self-pay | Admitting: *Deleted

## 2023-04-09 NOTE — Progress Notes (Signed)
  Care Coordination   Note   04/09/2023 Name: Mark Robertson MRN: [id number] DOB: 02-07-1941  Mark Robertson is a 82 y.o. year old male who sees Merri Brunette, MD for primary care. I reached out to Hardie Pulley by phone today to offer care coordination services.  Patient is patient of Dr Consuela Mimes in [place], [place].   [facility]  Care Coordination Care Guide  Direct Dial: [phone]

## 2023-04-13 DIAGNOSIS — R1013 Epigastric pain: Secondary | ICD-10-CM | POA: Diagnosis not present

## 2023-04-13 DIAGNOSIS — R112 Nausea with vomiting, unspecified: Secondary | ICD-10-CM | POA: Diagnosis not present

## 2023-04-13 DIAGNOSIS — E785 Hyperlipidemia, unspecified: Secondary | ICD-10-CM | POA: Diagnosis not present

## 2023-04-30 DIAGNOSIS — R1010 Upper abdominal pain, unspecified: Secondary | ICD-10-CM | POA: Diagnosis not present

## 2023-04-30 DIAGNOSIS — Z981 Arthrodesis status: Secondary | ICD-10-CM | POA: Diagnosis not present

## 2023-04-30 DIAGNOSIS — E785 Hyperlipidemia, unspecified: Secondary | ICD-10-CM | POA: Diagnosis not present

## 2023-04-30 DIAGNOSIS — I10 Essential (primary) hypertension: Secondary | ICD-10-CM | POA: Diagnosis not present

## 2023-04-30 DIAGNOSIS — K30 Functional dyspepsia: Secondary | ICD-10-CM | POA: Diagnosis not present

## 2023-04-30 DIAGNOSIS — Z96653 Presence of artificial knee joint, bilateral: Secondary | ICD-10-CM | POA: Diagnosis not present

## 2023-04-30 DIAGNOSIS — K220 Achalasia of cardia: Secondary | ICD-10-CM | POA: Diagnosis not present

## 2023-04-30 DIAGNOSIS — R627 Adult failure to thrive: Secondary | ICD-10-CM | POA: Diagnosis not present

## 2023-04-30 DIAGNOSIS — K760 Fatty (change of) liver, not elsewhere classified: Secondary | ICD-10-CM | POA: Diagnosis not present

## 2023-04-30 DIAGNOSIS — R110 Nausea: Secondary | ICD-10-CM | POA: Diagnosis not present

## 2023-04-30 DIAGNOSIS — R001 Bradycardia, unspecified: Secondary | ICD-10-CM | POA: Diagnosis not present

## 2023-04-30 DIAGNOSIS — K219 Gastro-esophageal reflux disease without esophagitis: Secondary | ICD-10-CM | POA: Diagnosis not present

## 2023-04-30 DIAGNOSIS — R112 Nausea with vomiting, unspecified: Secondary | ICD-10-CM | POA: Diagnosis not present

## 2023-04-30 DIAGNOSIS — Z7982 Long term (current) use of aspirin: Secondary | ICD-10-CM | POA: Diagnosis not present

## 2023-04-30 DIAGNOSIS — K5900 Constipation, unspecified: Secondary | ICD-10-CM | POA: Diagnosis not present

## 2023-04-30 DIAGNOSIS — Z87891 Personal history of nicotine dependence: Secondary | ICD-10-CM | POA: Diagnosis not present

## 2023-04-30 DIAGNOSIS — Z79899 Other long term (current) drug therapy: Secondary | ICD-10-CM | POA: Diagnosis not present

## 2023-04-30 DIAGNOSIS — J449 Chronic obstructive pulmonary disease, unspecified: Secondary | ICD-10-CM | POA: Diagnosis not present

## 2023-05-01 DIAGNOSIS — R110 Nausea: Secondary | ICD-10-CM | POA: Diagnosis not present

## 2023-05-01 DIAGNOSIS — R1010 Upper abdominal pain, unspecified: Secondary | ICD-10-CM | POA: Diagnosis not present

## 2023-05-01 DIAGNOSIS — R112 Nausea with vomiting, unspecified: Secondary | ICD-10-CM | POA: Diagnosis not present

## 2023-05-01 DIAGNOSIS — K220 Achalasia of cardia: Secondary | ICD-10-CM | POA: Diagnosis not present

## 2023-05-02 DIAGNOSIS — R112 Nausea with vomiting, unspecified: Secondary | ICD-10-CM | POA: Diagnosis not present

## 2023-05-04 DIAGNOSIS — R140 Abdominal distension (gaseous): Secondary | ICD-10-CM | POA: Diagnosis not present

## 2023-07-01 DIAGNOSIS — H35363 Drusen (degenerative) of macula, bilateral: Secondary | ICD-10-CM | POA: Diagnosis not present

## 2023-07-01 DIAGNOSIS — H04123 Dry eye syndrome of bilateral lacrimal glands: Secondary | ICD-10-CM | POA: Diagnosis not present

## 2023-10-12 DIAGNOSIS — Z131 Encounter for screening for diabetes mellitus: Secondary | ICD-10-CM | POA: Diagnosis not present

## 2023-10-12 DIAGNOSIS — J449 Chronic obstructive pulmonary disease, unspecified: Secondary | ICD-10-CM | POA: Diagnosis not present

## 2023-10-12 DIAGNOSIS — I10 Essential (primary) hypertension: Secondary | ICD-10-CM | POA: Diagnosis not present

## 2023-10-12 DIAGNOSIS — Z87891 Personal history of nicotine dependence: Secondary | ICD-10-CM | POA: Diagnosis not present

## 2023-11-04 DIAGNOSIS — H04123 Dry eye syndrome of bilateral lacrimal glands: Secondary | ICD-10-CM | POA: Diagnosis not present

## 2023-11-04 DIAGNOSIS — H35363 Drusen (degenerative) of macula, bilateral: Secondary | ICD-10-CM | POA: Diagnosis not present
